# Patient Record
Sex: Female | Born: 1960 | Race: White | Hispanic: No | Marital: Married | Smoking: Never smoker
Health system: Southern US, Community
[De-identification: ages and names within clinical notes are randomized; demographics above are authoritative.]

## PROBLEM LIST (undated history)

## (undated) DIAGNOSIS — T7840XA Allergy, unspecified, initial encounter: Secondary | ICD-10-CM

## (undated) DIAGNOSIS — B019 Varicella without complication: Secondary | ICD-10-CM

## (undated) DIAGNOSIS — G43909 Migraine, unspecified, not intractable, without status migrainosus: Secondary | ICD-10-CM

## (undated) DIAGNOSIS — M5030 Other cervical disc degeneration, unspecified cervical region: Secondary | ICD-10-CM

## (undated) DIAGNOSIS — N8000 Endometriosis of the uterus, unspecified: Secondary | ICD-10-CM

## (undated) DIAGNOSIS — K219 Gastro-esophageal reflux disease without esophagitis: Secondary | ICD-10-CM

## (undated) DIAGNOSIS — K625 Hemorrhage of anus and rectum: Secondary | ICD-10-CM

## (undated) DIAGNOSIS — K589 Irritable bowel syndrome without diarrhea: Secondary | ICD-10-CM

## (undated) DIAGNOSIS — K648 Other hemorrhoids: Secondary | ICD-10-CM

## (undated) DIAGNOSIS — K635 Polyp of colon: Secondary | ICD-10-CM

## (undated) DIAGNOSIS — E538 Deficiency of other specified B group vitamins: Secondary | ICD-10-CM

## (undated) DIAGNOSIS — E785 Hyperlipidemia, unspecified: Secondary | ICD-10-CM

## (undated) DIAGNOSIS — M549 Dorsalgia, unspecified: Secondary | ICD-10-CM

## (undated) HISTORY — DX: Deficiency of other specified B group vitamins: E53.8

## (undated) HISTORY — DX: Irritable bowel syndrome, unspecified: K58.9

## (undated) HISTORY — PX: COSMETIC SURGERY: SHX468

## (undated) HISTORY — PX: JOINT REPLACEMENT: SHX530

## (undated) HISTORY — PX: TOTAL HIP ARTHROPLASTY: SHX124

## (undated) HISTORY — DX: Other hemorrhoids: K64.8

## (undated) HISTORY — PX: LAPAROSCOPY: SHX197

## (undated) HISTORY — PX: ABDOMINAL HYSTERECTOMY: SHX81

## (undated) HISTORY — DX: Endometriosis of the uterus, unspecified: N80.00

## (undated) HISTORY — DX: Allergy, unspecified, initial encounter: T78.40XA

## (undated) HISTORY — DX: Hemorrhage of anus and rectum: K62.5

## (undated) HISTORY — DX: Gastro-esophageal reflux disease without esophagitis: K21.9

## (undated) HISTORY — DX: Other cervical disc degeneration, unspecified cervical region: M50.30

## (undated) HISTORY — DX: Endometriosis of uterus: N80.0

## (undated) HISTORY — DX: Hyperlipidemia, unspecified: E78.5

## (undated) HISTORY — DX: Migraine, unspecified, not intractable, without status migrainosus: G43.909

## (undated) HISTORY — DX: Dorsalgia, unspecified: M54.9

## (undated) HISTORY — PX: TUBAL LIGATION: SHX77

## (undated) HISTORY — DX: Varicella without complication: B01.9

## (undated) HISTORY — DX: Polyp of colon: K63.5

---

## 1992-10-27 HISTORY — PX: PARTIAL HYSTERECTOMY: SHX80

## 1996-10-27 HISTORY — PX: AUGMENTATION MAMMAPLASTY: SUR837

## 1998-03-05 ENCOUNTER — Other Ambulatory Visit: Admission: RE | Admit: 1998-03-05 | Discharge: 1998-03-05 | Payer: Self-pay | Admitting: Obstetrics and Gynecology

## 1998-12-19 ENCOUNTER — Emergency Department (HOSPITAL_COMMUNITY): Admission: EM | Admit: 1998-12-19 | Discharge: 1998-12-19 | Payer: Self-pay | Admitting: Emergency Medicine

## 1999-01-15 ENCOUNTER — Encounter: Payer: Self-pay | Admitting: Neurosurgery

## 1999-01-15 ENCOUNTER — Ambulatory Visit (HOSPITAL_COMMUNITY): Admission: RE | Admit: 1999-01-15 | Discharge: 1999-01-15 | Payer: Self-pay | Admitting: Neurosurgery

## 1999-03-15 ENCOUNTER — Other Ambulatory Visit: Admission: RE | Admit: 1999-03-15 | Discharge: 1999-03-15 | Payer: Self-pay | Admitting: Obstetrics and Gynecology

## 2000-02-25 HISTORY — PX: OOPHORECTOMY: SHX86

## 2000-02-25 HISTORY — PX: APPENDECTOMY: SHX54

## 2000-03-25 ENCOUNTER — Encounter (INDEPENDENT_AMBULATORY_CARE_PROVIDER_SITE_OTHER): Payer: Self-pay | Admitting: Specialist

## 2000-03-25 ENCOUNTER — Inpatient Hospital Stay (HOSPITAL_COMMUNITY): Admission: EM | Admit: 2000-03-25 | Discharge: 2000-03-26 | Payer: Self-pay | Admitting: Obstetrics and Gynecology

## 2000-12-18 ENCOUNTER — Encounter: Payer: Self-pay | Admitting: Gastroenterology

## 2001-01-14 ENCOUNTER — Other Ambulatory Visit: Admission: RE | Admit: 2001-01-14 | Discharge: 2001-01-14 | Payer: Self-pay | Admitting: Gastroenterology

## 2001-01-14 ENCOUNTER — Encounter: Payer: Self-pay | Admitting: Gastroenterology

## 2001-01-14 ENCOUNTER — Encounter (INDEPENDENT_AMBULATORY_CARE_PROVIDER_SITE_OTHER): Payer: Self-pay | Admitting: Specialist

## 2001-02-05 ENCOUNTER — Emergency Department (HOSPITAL_COMMUNITY): Admission: EM | Admit: 2001-02-05 | Discharge: 2001-02-05 | Payer: Self-pay | Admitting: Emergency Medicine

## 2001-06-22 ENCOUNTER — Other Ambulatory Visit: Admission: RE | Admit: 2001-06-22 | Discharge: 2001-06-22 | Payer: Self-pay | Admitting: Obstetrics and Gynecology

## 2002-06-30 ENCOUNTER — Other Ambulatory Visit: Admission: RE | Admit: 2002-06-30 | Discharge: 2002-06-30 | Payer: Self-pay | Admitting: Obstetrics and Gynecology

## 2002-07-26 ENCOUNTER — Encounter: Payer: Self-pay | Admitting: Obstetrics and Gynecology

## 2002-07-26 ENCOUNTER — Encounter: Admission: RE | Admit: 2002-07-26 | Discharge: 2002-07-26 | Payer: Self-pay | Admitting: Obstetrics and Gynecology

## 2002-09-20 ENCOUNTER — Encounter: Payer: Self-pay | Admitting: Gastroenterology

## 2002-09-20 LAB — HM COLONOSCOPY: HM Colonoscopy: NORMAL

## 2003-05-08 ENCOUNTER — Encounter: Admission: RE | Admit: 2003-05-08 | Discharge: 2003-05-08 | Payer: Self-pay | Admitting: Obstetrics and Gynecology

## 2003-05-08 ENCOUNTER — Encounter: Payer: Self-pay | Admitting: Obstetrics and Gynecology

## 2003-07-28 ENCOUNTER — Other Ambulatory Visit: Admission: RE | Admit: 2003-07-28 | Discharge: 2003-07-28 | Payer: Self-pay | Admitting: Obstetrics and Gynecology

## 2004-03-21 ENCOUNTER — Inpatient Hospital Stay (HOSPITAL_COMMUNITY): Admission: RE | Admit: 2004-03-21 | Discharge: 2004-03-22 | Payer: Self-pay | Admitting: Surgery

## 2004-05-31 ENCOUNTER — Encounter (INDEPENDENT_AMBULATORY_CARE_PROVIDER_SITE_OTHER): Payer: Self-pay | Admitting: *Deleted

## 2004-05-31 ENCOUNTER — Encounter: Admission: RE | Admit: 2004-05-31 | Discharge: 2004-05-31 | Payer: Self-pay | Admitting: Surgery

## 2004-08-06 ENCOUNTER — Emergency Department (HOSPITAL_COMMUNITY): Admission: EM | Admit: 2004-08-06 | Discharge: 2004-08-06 | Payer: Self-pay | Admitting: Emergency Medicine

## 2004-09-13 ENCOUNTER — Encounter: Admission: RE | Admit: 2004-09-13 | Discharge: 2004-09-13 | Payer: Self-pay | Admitting: Obstetrics and Gynecology

## 2004-09-16 ENCOUNTER — Other Ambulatory Visit: Admission: RE | Admit: 2004-09-16 | Discharge: 2004-09-16 | Payer: Self-pay | Admitting: Obstetrics and Gynecology

## 2004-10-08 ENCOUNTER — Emergency Department (HOSPITAL_COMMUNITY): Admission: EM | Admit: 2004-10-08 | Discharge: 2004-10-08 | Payer: Self-pay | Admitting: Family Medicine

## 2004-12-17 ENCOUNTER — Ambulatory Visit: Payer: Self-pay | Admitting: Family Medicine

## 2005-03-14 ENCOUNTER — Ambulatory Visit: Payer: Self-pay | Admitting: Family Medicine

## 2005-04-03 ENCOUNTER — Ambulatory Visit: Payer: Self-pay | Admitting: Gastroenterology

## 2005-05-07 ENCOUNTER — Ambulatory Visit: Payer: Self-pay | Admitting: Gastroenterology

## 2005-07-11 ENCOUNTER — Ambulatory Visit: Payer: Self-pay | Admitting: Internal Medicine

## 2005-07-31 ENCOUNTER — Ambulatory Visit: Payer: Self-pay | Admitting: Internal Medicine

## 2005-08-01 ENCOUNTER — Encounter: Payer: Self-pay | Admitting: Gastroenterology

## 2005-08-07 ENCOUNTER — Ambulatory Visit: Payer: Self-pay | Admitting: Internal Medicine

## 2005-08-20 ENCOUNTER — Ambulatory Visit: Payer: Self-pay | Admitting: Internal Medicine

## 2005-08-28 ENCOUNTER — Encounter (INDEPENDENT_AMBULATORY_CARE_PROVIDER_SITE_OTHER): Payer: Self-pay | Admitting: *Deleted

## 2005-08-28 ENCOUNTER — Ambulatory Visit: Payer: Self-pay | Admitting: Cardiology

## 2005-10-10 ENCOUNTER — Other Ambulatory Visit: Admission: RE | Admit: 2005-10-10 | Discharge: 2005-10-10 | Payer: Self-pay | Admitting: Obstetrics and Gynecology

## 2005-10-24 ENCOUNTER — Ambulatory Visit: Payer: Self-pay | Admitting: Internal Medicine

## 2005-10-30 ENCOUNTER — Encounter: Admission: RE | Admit: 2005-10-30 | Discharge: 2005-10-30 | Payer: Self-pay | Admitting: Internal Medicine

## 2005-12-24 ENCOUNTER — Ambulatory Visit: Payer: Self-pay | Admitting: Cardiology

## 2006-02-10 ENCOUNTER — Ambulatory Visit: Payer: Self-pay | Admitting: Cardiology

## 2006-05-06 ENCOUNTER — Encounter: Admission: RE | Admit: 2006-05-06 | Discharge: 2006-05-06 | Payer: Self-pay | Admitting: Obstetrics and Gynecology

## 2006-09-21 ENCOUNTER — Ambulatory Visit: Payer: Self-pay | Admitting: Internal Medicine

## 2006-12-25 ENCOUNTER — Encounter: Admission: RE | Admit: 2006-12-25 | Discharge: 2006-12-25 | Payer: Self-pay | Admitting: Surgery

## 2006-12-25 ENCOUNTER — Encounter (INDEPENDENT_AMBULATORY_CARE_PROVIDER_SITE_OTHER): Payer: Self-pay | Admitting: *Deleted

## 2007-01-18 ENCOUNTER — Encounter: Admission: RE | Admit: 2007-01-18 | Discharge: 2007-01-18 | Payer: Self-pay | Admitting: Surgery

## 2007-07-16 ENCOUNTER — Encounter: Admission: RE | Admit: 2007-07-16 | Discharge: 2007-07-16 | Payer: Self-pay | Admitting: Obstetrics and Gynecology

## 2007-10-04 ENCOUNTER — Emergency Department (HOSPITAL_COMMUNITY): Admission: EM | Admit: 2007-10-04 | Discharge: 2007-10-04 | Payer: Self-pay | Admitting: Emergency Medicine

## 2007-10-07 ENCOUNTER — Ambulatory Visit: Payer: Self-pay | Admitting: Family Medicine

## 2007-10-07 DIAGNOSIS — E538 Deficiency of other specified B group vitamins: Secondary | ICD-10-CM | POA: Insufficient documentation

## 2007-10-07 DIAGNOSIS — M545 Low back pain, unspecified: Secondary | ICD-10-CM | POA: Insufficient documentation

## 2007-10-07 DIAGNOSIS — N800 Endometriosis of uterus: Secondary | ICD-10-CM | POA: Insufficient documentation

## 2007-10-25 LAB — HM MAMMOGRAPHY: HM Mammogram: NORMAL

## 2007-10-25 LAB — CONVERTED CEMR LAB: Pap Smear: NORMAL

## 2008-01-06 ENCOUNTER — Ambulatory Visit: Payer: Self-pay | Admitting: Cardiology

## 2008-01-06 ENCOUNTER — Encounter: Admission: RE | Admit: 2008-01-06 | Discharge: 2008-01-06 | Payer: Self-pay | Admitting: Obstetrics and Gynecology

## 2008-01-06 LAB — CONVERTED CEMR LAB
AST: 21 units/L (ref 0–37)
Alkaline Phosphatase: 59 units/L (ref 39–117)
Bilirubin, Direct: 0.1 mg/dL (ref 0.0–0.3)
Cholesterol: 217 mg/dL (ref 0–200)
Direct LDL: 168.7 mg/dL
HDL: 37.4 mg/dL — ABNORMAL LOW (ref >39.0)
Triglycerides: 79 mg/dL (ref 0–149)

## 2008-01-20 ENCOUNTER — Ambulatory Visit: Payer: Self-pay | Admitting: Cardiology

## 2008-01-26 ENCOUNTER — Ambulatory Visit: Payer: Self-pay | Admitting: Family Medicine

## 2008-01-27 LAB — CONVERTED CEMR LAB
BUN: 12 mg/dL (ref 6–23)
CO2: 31 meq/L (ref 19–32)
Calcium: 9 mg/dL (ref 8.4–10.5)
Chloride: 108 meq/L (ref 96–112)
Creatinine, Ser: 0.7 mg/dL (ref 0.4–1.2)
GFR calc non Af Amer: 96 mL/min
Hemoglobin: 13.7 g/dL (ref 12.0–15.0)
Lymphocytes Relative: 39 % (ref 12.0–46.0)
MCHC: 33.2 g/dL (ref 30.0–36.0)
MCV: 95.6 fL (ref 78.0–100.0)
Neutro Abs: 1.9 10*3/uL (ref 1.4–7.7)
Platelets: 235 10*3/uL (ref 150–400)
RBC: 4.32 M/uL (ref 3.87–5.11)
Sodium: 142 meq/L (ref 135–145)
TSH: 1.55 microintl units/mL (ref 0.35–5.50)
Vitamin B-12: 159 pg/mL — ABNORMAL LOW (ref 211–911)

## 2008-02-03 ENCOUNTER — Encounter (INDEPENDENT_AMBULATORY_CARE_PROVIDER_SITE_OTHER): Payer: Self-pay | Admitting: *Deleted

## 2008-02-07 ENCOUNTER — Telehealth (INDEPENDENT_AMBULATORY_CARE_PROVIDER_SITE_OTHER): Payer: Self-pay | Admitting: *Deleted

## 2008-02-08 ENCOUNTER — Ambulatory Visit: Payer: Self-pay | Admitting: Family Medicine

## 2008-02-24 ENCOUNTER — Ambulatory Visit: Payer: Self-pay | Admitting: Family Medicine

## 2008-03-02 ENCOUNTER — Ambulatory Visit: Payer: Self-pay | Admitting: Family Medicine

## 2008-03-09 ENCOUNTER — Ambulatory Visit: Payer: Self-pay | Admitting: Family Medicine

## 2008-03-16 ENCOUNTER — Ambulatory Visit: Payer: Self-pay | Admitting: Family Medicine

## 2008-03-22 ENCOUNTER — Telehealth (INDEPENDENT_AMBULATORY_CARE_PROVIDER_SITE_OTHER): Payer: Self-pay | Admitting: *Deleted

## 2008-03-29 LAB — CONVERTED CEMR LAB
Basophils Absolute: 0 10*3/uL (ref 0.0–0.1)
HCT: 41 % (ref 36.0–46.0)
Lymphocytes Relative: 42.1 % (ref 12.0–46.0)
Monocytes Absolute: 0.5 10*3/uL (ref 0.1–1.0)
Neutro Abs: 1.6 10*3/uL (ref 1.4–7.7)
Neutrophils Relative %: 40.8 % — ABNORMAL LOW (ref 43.0–77.0)
RDW: 12.6 % (ref 11.5–14.6)
Vitamin B-12: 388 pg/mL (ref 211–911)
WBC: 4 10*3/uL — ABNORMAL LOW (ref 4.5–10.5)

## 2008-03-30 ENCOUNTER — Encounter (INDEPENDENT_AMBULATORY_CARE_PROVIDER_SITE_OTHER): Payer: Self-pay | Admitting: *Deleted

## 2008-04-04 ENCOUNTER — Telehealth (INDEPENDENT_AMBULATORY_CARE_PROVIDER_SITE_OTHER): Payer: Self-pay | Admitting: *Deleted

## 2008-06-12 ENCOUNTER — Ambulatory Visit: Payer: Self-pay | Admitting: Internal Medicine

## 2008-06-12 DIAGNOSIS — J309 Allergic rhinitis, unspecified: Secondary | ICD-10-CM | POA: Insufficient documentation

## 2008-06-13 ENCOUNTER — Telehealth: Payer: Self-pay | Admitting: Internal Medicine

## 2008-11-23 ENCOUNTER — Ambulatory Visit: Payer: Self-pay | Admitting: Family Medicine

## 2008-11-23 DIAGNOSIS — E559 Vitamin D deficiency, unspecified: Secondary | ICD-10-CM | POA: Insufficient documentation

## 2008-11-27 ENCOUNTER — Encounter (INDEPENDENT_AMBULATORY_CARE_PROVIDER_SITE_OTHER): Payer: Self-pay | Admitting: *Deleted

## 2008-11-27 LAB — CONVERTED CEMR LAB: Vit D, 1,25-Dihydroxy: 24 — ABNORMAL LOW (ref 30–89)

## 2009-02-14 DIAGNOSIS — E785 Hyperlipidemia, unspecified: Secondary | ICD-10-CM | POA: Insufficient documentation

## 2009-10-02 ENCOUNTER — Ambulatory Visit: Payer: Self-pay | Admitting: Family Medicine

## 2009-10-02 DIAGNOSIS — K219 Gastro-esophageal reflux disease without esophagitis: Secondary | ICD-10-CM | POA: Insufficient documentation

## 2009-10-03 ENCOUNTER — Encounter (INDEPENDENT_AMBULATORY_CARE_PROVIDER_SITE_OTHER): Payer: Self-pay | Admitting: *Deleted

## 2009-10-10 ENCOUNTER — Telehealth: Payer: Self-pay | Admitting: Family Medicine

## 2009-10-27 HISTORY — PX: ESOPHAGOGASTRODUODENOSCOPY: SHX1529

## 2009-10-31 ENCOUNTER — Ambulatory Visit: Payer: Self-pay | Admitting: Gastroenterology

## 2009-10-31 ENCOUNTER — Encounter (INDEPENDENT_AMBULATORY_CARE_PROVIDER_SITE_OTHER): Payer: Self-pay | Admitting: *Deleted

## 2009-10-31 DIAGNOSIS — R1013 Epigastric pain: Secondary | ICD-10-CM | POA: Insufficient documentation

## 2009-11-01 LAB — CONVERTED CEMR LAB
AST: 24 units/L (ref 0–37)
Alkaline Phosphatase: 52 units/L (ref 39–117)
Basophils Absolute: 0 10*3/uL (ref 0.0–0.1)
Basophils Relative: 0.8 % (ref 0.0–3.0)
CO2: 28 meq/L (ref 19–32)
Chloride: 104 meq/L (ref 96–112)
Eosinophils Absolute: 0.1 10*3/uL (ref 0.0–0.7)
HCT: 42.4 % (ref 36.0–46.0)
Lymphocytes Relative: 43.9 % (ref 12.0–46.0)
Lymphs Abs: 1.7 10*3/uL (ref 0.7–4.0)
Monocytes Absolute: 0.3 10*3/uL (ref 0.1–1.0)
Monocytes Relative: 8 % (ref 3.0–12.0)
Potassium: 4.4 meq/L (ref 3.5–5.1)
RDW: 12.7 % (ref 11.5–14.6)
Total Bilirubin: 1 mg/dL (ref 0.3–1.2)
Total Protein: 6.7 g/dL (ref 6.0–8.3)
WBC: 3.9 10*3/uL — ABNORMAL LOW (ref 4.5–10.5)

## 2009-11-05 ENCOUNTER — Ambulatory Visit (HOSPITAL_COMMUNITY): Admission: RE | Admit: 2009-11-05 | Discharge: 2009-11-05 | Payer: Self-pay | Admitting: Gastroenterology

## 2009-11-14 ENCOUNTER — Ambulatory Visit: Payer: Self-pay | Admitting: Gastroenterology

## 2009-11-15 ENCOUNTER — Telehealth: Payer: Self-pay | Admitting: Gastroenterology

## 2009-12-03 ENCOUNTER — Telehealth (INDEPENDENT_AMBULATORY_CARE_PROVIDER_SITE_OTHER): Payer: Self-pay | Admitting: *Deleted

## 2009-12-05 ENCOUNTER — Encounter: Payer: Self-pay | Admitting: Family Medicine

## 2009-12-18 ENCOUNTER — Ambulatory Visit: Payer: Self-pay | Admitting: Gastroenterology

## 2010-04-06 ENCOUNTER — Emergency Department (HOSPITAL_BASED_OUTPATIENT_CLINIC_OR_DEPARTMENT_OTHER): Admission: EM | Admit: 2010-04-06 | Discharge: 2010-04-07 | Payer: Self-pay | Admitting: Emergency Medicine

## 2010-05-08 ENCOUNTER — Ambulatory Visit: Payer: Self-pay | Admitting: Family Medicine

## 2010-05-09 ENCOUNTER — Telehealth: Payer: Self-pay | Admitting: Gastroenterology

## 2010-05-14 ENCOUNTER — Telehealth (INDEPENDENT_AMBULATORY_CARE_PROVIDER_SITE_OTHER): Payer: Self-pay | Admitting: *Deleted

## 2010-05-14 LAB — CONVERTED CEMR LAB
ALT: 10 units/L (ref 0–35)
AST: 20 units/L (ref 0–37)
Albumin: 3.9 g/dL (ref 3.5–5.2)
Alkaline Phosphatase: 51 units/L (ref 39–117)
Basophils Absolute: 0 10*3/uL (ref 0.0–0.1)
Basophils Relative: 0.8 % (ref 0.0–3.0)
Bilirubin, Direct: 0.1 mg/dL (ref 0.0–0.3)
Folate: 8.4 ng/mL
Hemoglobin: 13.6 g/dL (ref 12.0–15.0)
Neutro Abs: 1.9 10*3/uL (ref 1.4–7.7)
RBC: 4.16 M/uL (ref 3.87–5.11)
Total Bilirubin: 0.6 mg/dL (ref 0.3–1.2)
Vitamin B-12: 178 pg/mL — ABNORMAL LOW (ref 211–911)

## 2010-05-24 ENCOUNTER — Ambulatory Visit: Payer: Self-pay | Admitting: Family Medicine

## 2010-05-24 DIAGNOSIS — J018 Other acute sinusitis: Secondary | ICD-10-CM | POA: Insufficient documentation

## 2010-05-24 DIAGNOSIS — J029 Acute pharyngitis, unspecified: Secondary | ICD-10-CM | POA: Insufficient documentation

## 2010-11-04 ENCOUNTER — Ambulatory Visit
Admission: RE | Admit: 2010-11-04 | Discharge: 2010-11-04 | Payer: Self-pay | Source: Home / Self Care | Attending: Gastroenterology | Admitting: Gastroenterology

## 2010-11-04 ENCOUNTER — Ambulatory Visit: Admit: 2010-11-04 | Payer: Self-pay | Admitting: Gastroenterology

## 2010-11-04 DIAGNOSIS — K5900 Constipation, unspecified: Secondary | ICD-10-CM | POA: Insufficient documentation

## 2010-11-04 DIAGNOSIS — R1030 Lower abdominal pain, unspecified: Secondary | ICD-10-CM | POA: Insufficient documentation

## 2010-11-04 DIAGNOSIS — R109 Unspecified abdominal pain: Secondary | ICD-10-CM | POA: Insufficient documentation

## 2010-11-17 ENCOUNTER — Encounter: Payer: Self-pay | Admitting: Obstetrics and Gynecology

## 2010-11-17 ENCOUNTER — Encounter: Payer: Self-pay | Admitting: Family Medicine

## 2010-11-28 NOTE — Assessment & Plan Note (Signed)
Summary: MOUTH FEELING BURN AND LABS--PH   Vital Signs:  Patient profile:   50 year old female Height:      65 inches Weight:      137 pounds Temp:     98.4 degrees F oral Pulse rate:   80 / minute BP sitting:   90 / 52  (left arm)  Vitals Entered By: Jeremy Johann CMA (May 08, 2010 8:19 AM) CC: burn and tingling in mouth, labs, refills Comments REVIEWED MED LIST, PATIENT AGREED DOSE AND INSTRUCTION CORRECT    History of Present Illness: Pt here c/o burning and tingling in mouth for 2-3 weeks.  Worse with cinnamon gum and spicy chicken. Last night she had some salsa that bothered her.   Pt also c/o increased GERD symptoms---she stopped the dexilant.  She has tried zegerid and prevacid with no relief.   Pt also needs vita D and B12 checked.  Current Medications (verified): 1)  Estratest 1.25-2.5 Mg  Tabs (Est Estrogens-Methyltest) .... Take One Tablet Daily 2)  Vitamin D [address] Unit  Caps (Ergocalciferol) .... Take One Tablet Weekly. Needs Labwork. 3)  Cobal-1000 1000 Mcg/ml Inj Soln (Cyanocobalamin) .... Take One Injection Weekly For One Month and Then One Injection Monthly After 4 Weekly Injections. 4)  Dexilant 60 Mg Cpdr (Dexlansoprazole) .Marland Kitchen.. 1 By Mouth Once Daily 5)  Hyoscyamine Sulfate 0.125 Mg Subl (Hyoscyamine Sulfate) .... Take 2 Tablets By Mouth Under Tongue Four Times A Day As Needed  Allergies (verified): 1)  Prednisone 2)  Codeine 3)  Morphine  Past History:  Past medical, surgical, family and social histories (including risk factors) reviewed for relevance to current acute and chronic problems.  Past Medical History: Reviewed history from 10/31/2009 and no changes required. HYPERLIPIDEMIA-MIXED (ICD-272.4) UNSPECIFIED VITAMIN D DEFICIENCY (ICD-268.9) ALLERGIC RHINITIS (ICD-477.9) BACK PAIN, LUMBAR (ICD-724.2) ENDOMETRIOSIS OF UTERUS (ICD-617.0) B12 DEFICIENCY (ICD-266.2) INTERNAL HEMORRHOIDS  Past Surgical History: Reviewed history from 02/14/2009 and  no changes required. Appendectomy 2001 Hysterectomy (partial 1994) 2001 full hysterectomy Laparscopies for endometriosis  Family History: Reviewed history from 10/31/2009 and no changes required. Mother: CHF in her 62's and hyperlipidemia but no known CAD No FH of Colon Cancer: Family History of Colon Polyps:Father   Social History: Reviewed history from 06/12/2008 and no changes required. Occupation:paralegal Married Never Smoked Alcohol use-no Drug use-no Regular exercise-yes  Review of Systems      See HPI  Physical Exam  General:  Well-developed,well-nourished,in no acute distress; alert,appropriate and cooperative throughout examination Mouth:  Oral mucosa and oropharynx without lesions or exudates.  Teeth in good repair. Abdomen:  soft, no distention, no masses, no guarding, and epigastric tenderness.   Skin:  Intact without suspicious lesions or rashes Cervical Nodes:  No lymphadenopathy noted Psych:  Cognition and judgment appear intact. Alert and cooperative with normal attention span and concentration. No apparent delusions, illusions, hallucinations   Impression & Recommendations:  Problem # 1:  GERD (ICD-530.81)  Her updated medication list for this problem includes:    Dexilant 60 Mg Cpdr (Dexlansoprazole) .Marland Kitchen... 1 by mouth once daily    Hyoscyamine Sulfate 0.125 Mg Subl (Hyoscyamine sulfate) .Marland Kitchen... Take 2 tablets by mouth under tongue four times a day as needed  Orders: Venipuncture (60454) TLB-CBC Platelet - w/Differential (85025-CBCD) TLB-Hepatic/Liver Function Pnl (80076-HEPATIC) TLB-B12 + Folate Pnl (09811_91478-G95/AOZ) TLB-Amylase (82150-AMYL) TLB-Lipase (83690-LIPASE) T-Vitamin D (25-Hydroxy) (30865-78469) TLB-H. Pylori Abs(Helicobacter Pylori) (86677-HELICO)  Diagnostics Reviewed:  EGD: DONE (11/14/2009) Discussed lifestyle modifications, diet, antacids/medications, and preventive measures. Handout provided.   Problem #  2:  ABDOMINAL PAIN,  EPIGASTRIC (ICD-789.06)  Orders: Venipuncture (04540) TLB-CBC Platelet - w/Differential (85025-CBCD) TLB-Hepatic/Liver Function Pnl (80076-HEPATIC) TLB-B12 + Folate Pnl (98119_14782-N56/OZH) TLB-Amylase (82150-AMYL) TLB-Lipase (83690-LIPASE) T-Vitamin D (25-Hydroxy) (08657-84696)  Discussed symptom control with the patient.   Problem # 3:  UNSPECIFIED VITAMIN D DEFICIENCY (ICD-268.9)  Orders: Venipuncture (29528) TLB-CBC Platelet - w/Differential (85025-CBCD) TLB-Hepatic/Liver Function Pnl (80076-HEPATIC) TLB-B12 + Folate Pnl (41324_40102-V25/DGU) TLB-Amylase (82150-AMYL) TLB-Lipase (83690-LIPASE) T-Vitamin D (25-Hydroxy) (44034-74259)  Problem # 4:  B12 DEFICIENCY (ICD-266.2)  Orders: Venipuncture (56387) TLB-CBC Platelet - w/Differential (85025-CBCD) TLB-Hepatic/Liver Function Pnl (80076-HEPATIC) TLB-B12 + Folate Pnl (56433_29518-A41/YSA) TLB-Amylase (82150-AMYL) TLB-Lipase (83690-LIPASE) T-Vitamin D (25-Hydroxy) (63016-01093)  Problem # 5:  HYPERLIPIDEMIA-MIXED (ICD-272.4) pt had labs checked at gyn and cholesterol normal now per pt---she will get a copy to [place] Orders: Venipuncture (23557) TLB-CBC Platelet - w/Differential (85025-CBCD) TLB-Hepatic/Liver Function Pnl (80076-HEPATIC) TLB-B12 + Folate Pnl (32202_54270-W23/JSE) TLB-Amylase (82150-AMYL) TLB-Lipase (83690-LIPASE) T-Vitamin D (25-Hydroxy) (83151-76160)  Complete Medication List: 1)  Estratest 1.25-2.5 Mg Tabs (Est estrogens-methyltest) .... Take one tablet daily 2)  Vitamin D 73710 Unit Caps (Ergocalciferol) .... Take one tablet weekly. needs labwork. 3)  Cobal-1000 1000 Mcg/ml Inj Soln (Cyanocobalamin) .... Take one injection weekly for one month and then one injection monthly after 4 weekly injections. 4)  Dexilant 60 Mg Cpdr (Dexlansoprazole) .Marland Kitchen.. 1 by mouth once daily 5)  Hyoscyamine Sulfate 0.125 Mg Subl (Hyoscyamine sulfate) .... Take 2 tablets by mouth under tongue four times a day as  needed  Patient Instructions: 1)  Avoid foods high in acid(tomatoes, citrus juices,spicy foods).Avoid eating within two hours of lying down or before exercising. Do not over eat: try smaller more frequent meals. Elevate head of bed twelve inches when sleeping.  Prescriptions: DEXILANT 60 MG CPDR (DEXLANSOPRAZOLE) 1 by mouth once daily  #30 x 11   Entered and Authorized by:   Loreen Freud DO   Signed by:   Loreen Freud DO on 05/08/2010   Method used:   [facility]  [address] [phone][facility] (retail[facility]       [address]       [place], [place]  [address]       Ph: [phone] or [phone]       Fax: [phone]   RxID:   [id number]

## 2010-11-28 NOTE — Procedures (Signed)
Summary: Flexible Sigmoidoscopy   Flexible Sigmoidoscopy  Procedure date:  05/07/2005  Findings:      Results: Hemorrhoids. Quality of Study: Good.  Patient Name: Patricia Alvarez, Aronov MRN:  Procedure Procedures: Flexible Proctosigmoidoscopy CPT: [phone].    with injection sclerosis of hemorrhoids CPT: 46500 Personnel: Endoscopist: Venita Lick. Russella Dar, MD, [facility] Graham.  Exam Location: Exam performed in Outpatient Clinic. Outpatient  Patient Consent: Procedure, Alternatives, Risks and Benefits discussed, consent obtained, from patient.  Indications Symptoms: Hematochezia. Rectal pain.  History  Current Medications: Patient is not currently taking Coumadin.  Pre-Exam Physical: Performed May 07, 2005. Cardio-pulmonary exam  WNL. Rectal exam, HEENT exam , Abdominal exam, Mental status exam WNL.  Exam Exam: Extent visualized: Descending Colon. Extent of exam: 50 cm. Colon retroflexion performed. ASA Classification: II. Tolerance: excellent.  Monitoring: Pulse and BP monitoring, Oximetry used. Supplemental O2 given.  Colon Prep Used Fleets enema for colon prep. Prep: excellent.  Sedation Meds: Patient assessed and found to be appropriate for moderate (conscious) sedation. Fentanyl 50 mcg. given IV. Versed 5 mg. given IV.  Findings NORMAL EXAM: Descending Colon to Sigmoid Colon.  HEMORRHOIDS: Internal. Size: Small. Not bleeding. Not thrombosed. ICD9: Hemorrhoids, Internal: 455.0.  - Injection: Rectum. 1 ccs. Outcome: successful. Comments: 23.4% saline injected.   Assessment  Diagnoses: 455.0: Hemorrhoids, Internal.   Events  Unplanned Intervention: No intervention was required.  Unplanned Events: There were no complications. Plans Patient Education: Patient given standard instructions for: Hemorrhoids.  Disposition: After procedure patient sent to recovery. After recovery patient sent home.  Scheduling: Office Visit, to [employer]. Russella Dar, MD, [facility], prn    This report  was created from the original endoscopy report, which was reviewed and signed by the above listed endoscopist.    cc: Loreen Freud, DO

## 2010-11-28 NOTE — Procedures (Signed)
Summary: EGD and biopsy   EGD  Procedure date:  01/14/2001  Findings:      Findings: Esophagitis  Location: [facility]    EGD  Procedure date:  01/14/2001  Findings:      Findings: Esophagitis  Location:  [facility]   Patient Name: Patricia Alvarez, Patricia Alvarez MRN:  Procedure Procedures: Panendoscopy (EGD) CPT: 43235.    with biopsy(s)/brushing(s). CPT: [id number].  Personnel: Endoscopist: Venita Lick. Russella Dar, MD, Clement[facility] Graham.  Referred By: Linda Hedges Plotnikov, MD.  Exam Location: Exam performed in Outpatient Clinic. Outpatient  Patient Consent: Procedure, Alternatives, Risks and Benefits discussed, consent obtained, from patient.  Indications  Abnormal Exams, Studies: B12 deficiency,  Symptoms: Abdominal pain, location: epigastric. Reflux symptoms  History  Pre-Exam Physical: Performed Jan 14, 2001  Cardio-pulmonary exam, HEENT exam, Abdominal exam, Extremity exam, Neurological exam, Mental status exam WNL.  Exam Exam Info: Maximum depth of insertion Duodenum, intended Duodenum. Patient position: on left side. Vocal cords not visualized. Gastric retroflexion performed. Images taken. ASA Classification: I. Tolerance: excellent.  Sedation Meds: Fentanyl 50 mcg. Versed 4 mg. Cetacaine Spray 2 sprays  Monitoring: BP and pulse monitoring done. Oximetry used. Supplemental O2 given  Findings ESOPHAGEAL INFLAMMATION: as a result of reflux. Severity is mild, erythema only.  [place] Classification: Grade 0. ICD9: Esophagitis, Reflux: 530.11.  - Normal: Cardia to Duodenal 2nd Portion.    Comments: gastric body biopsy taken Assessment Abnormal examination, see findings above.  Diagnoses: 530.11: Esophagitis, Reflux.   Events  Unplanned Intervention: No unplanned interventions were required.  Unplanned Events: There were no complications. Plans Medication(s): Await pathology. PPI: Pantoprazole/Protonix 40 mg QD,   Patient Education: Patient given  standard instructions for: Reflux.  Disposition: After procedure patient sent to recovery. After recovery patient sent home.  Scheduling: Blood Tests, to [employer]. Russella Dar, MD, [facility] Graham, Jan 14, 2001.  Clinic Visit, to [facility]. Russella Dar, MD, [facility], around Feb 25, 2001.  Referring provider, to Linda Hedges. Plotnikov, MD,    This report was created from the original endoscopy report, which was reviewed and signed by the above listed endoscopist.    cc: Linda Hedges. Plotnikov, MD        SP Surgical Pathology - STATUS: Final             By: Morrie Sheldon,       Perform Date: 21Mar02 15:12  Ordered By: Rica Records Date:  Facility: [facility]                              Department: CPATH  Service Report Text  [facility]   [address] [place], [place] [address]   [phone]    REPORT OF SURGICAL PATHOLOGY    Case #: [id number]   Patient Name: Patricia Alvarez, Patricia Alvarez   PID: [id number]   Pathologist: Beulah Gandy. Luisa Hart, MD   DOB/Age 02-01-1961 (Age: 50) Gender: F   Date Taken: 01/14/2001   Date Received: 01/14/2001    FINAL DIAGNOSIS    ***MICROSCOPIC EXAMINATION AND DIAGNOSIS***    STOMACH, BODY, BIOPSY: UNREMARKABLE GASTRIC FUNDIC MUCOSA. NO   ATROPHIC CHANGES, HELICONBACTER PYLORI OR INFLAMMATION   IDENTIFIED.    COMMENT   A Diff Quik stain is performed to determine the possibility of   the presence of Helicobacter pylori. No Helicobacter pylori is   identified  with the Diff Quik stain. The control stained   appropriately.    ab   Date Reported: 01/15/2001 Beulah Gandy. Luisa Hart, MD   *** Electronically Signed Out By JDP ***    Clinical information   B12 deficiency, R/O gastric atrophy. (tmc)    specimen(s) obtained   Stomach body, biopsy    Gross Description   Received in formalin is a tan, soft tissue fragment that is   submitted en toto. Size: 0.1 cm (SSW:smr 3/21)    smr/

## 2010-11-28 NOTE — Miscellaneous (Signed)
Summary: Orders Update/ glycopyrrolate rx  Clinical Lists Changes  Medications: Added new medication of GLYCOPYRROLATE 2 MG  TABS (GLYCOPYRROLATE) 2 mg p.o. b.i.d. as needed - Signed Rx of GLYCOPYRROLATE 2 MG  TABS (GLYCOPYRROLATE) 2 mg p.o. b.i.d. as needed;  #60 x 11;  Signed;  Entered by: Weston Brass;  Authorized by: Meryl Dare MD [facility];  [facility] [address] [phone]*, [address] [place], [place], [place]  [address], Ph: [phone] or [phone], Fax: [phone] Allergies: Added new allergy or adverse reaction of * CODEINE,MORPHINE    Prescriptions: GLYCOPYRROLATE 2 MG  TABS (GLYCOPYRROLATE) 2 mg p.o. b.i.d. as needed  #60 x 11   Entered by:   Weston Brass   Authorized by:   Meryl Dare MD [facility]   Signed by:   Weston Brass on 11/14/2009   Method used:   [facility]        [facility]  [address] [phone]* (retail)       [address]       [place], [place]  [address]       Ph: [phone] or [phone]       Fax: [phone]   RxID:   [id number]

## 2010-11-28 NOTE — Progress Notes (Signed)
Summary: lab results  Phone Note Outgoing Call   Call placed by: [facility] CMA,  May 14, 2010 1:02 PM Details for Reason: B12 low---- b12 weekly x4 then monthly---recheck 1 month Summary of Call: left message to call office................Marland KitchenFelecia Deloach CMA  May 14, 2010 1:02 PM   Follow-up for Phone Call        Message left on triage VM: Patient would like recent lab results, please call [phone] Follow-up by: Shonna Chock CMA,  May 14, 2010 2:04 PM  Additional Follow-up for Phone Call Additional follow up Details #1::        DISCUSS WITH PATIENT, pt would  like to do injection at home as she has in past rx sent to pharmacy.............Marland KitchenFelecia Deloach CMA  May 14, 2010 4:41 PM     New/Updated Medications: COBAL-1000 1000 MCG/ML INJ SOLN (CYANOCOBALAMIN) Take one injection(72ml) weekly for one month and then one injection monthly after 4 weekly injections. Prescriptions: COBAL-1000 1000 MCG/ML INJ SOLN (CYANOCOBALAMIN) Take one injection(11ml) weekly for one month and then one injection monthly after 4 weekly injections.  #36month x 1   Entered by:   Jeremy Johann CMA   Authorized by:   Loreen Freud DO   Signed by:   Jeremy Johann CMA on 05/14/2010   Method used:   [facility] ...       [facility]  [address] [phone]* (retail)       [address]       [place], [place]  [address]       Ph: [phone] or [phone]       Fax: [phone]   RxID:   [id number]

## 2010-11-28 NOTE — Assessment & Plan Note (Signed)
Summary: GERD...EM   History of Present Illness Visit Type: consult  Primary GI MD: Elie Goody MD [facility] Primary Provider: Loreen Freud, DO  Requesting Provider: Loreen Freud, DO  Chief Complaint: Epigastric pain, pain that radiates to her upper back on right side, and nausea  History of Present Illness:   This is a 50 year old female who relates epigastric pain associated with nausea and reflux symptoms. GERD symptoms had previously been treated with Axid in 2002 and responded. She was recently treated with Zegerid OTC for 2 weeks and responded.  She also has episodes of epigastric pain that radiates to her right upper quadrant and back. She is not certain that these symptoms have improved with acid suppressants. She is currently taking Dexilant as needed, which has not fully relieved her symptoms.   GI Review of Systems    Reports abdominal pain, acid reflux, and  nausea.     Location of  Abdominal pain: epigastric area.    Denies belching, bloating, chest pain, dysphagia with liquids, dysphagia with solids, heartburn, loss of appetite, vomiting, vomiting blood, weight loss, and  weight gain.        Denies anal fissure, black tarry stools, change in bowel habit, constipation, diarrhea, diverticulosis, fecal incontinence, heme positive stool, hemorrhoids, irritable bowel syndrome, jaundice, light color stool, liver problems, rectal bleeding, and  rectal pain.   Current Medications (verified): 1)  Estratest 1.25-2.5 Mg  Tabs (Est Estrogens-Methyltest) .... Take One Tablet Daily 2)  Vitamin D 13086 Unit  Caps (Ergocalciferol) .... Take One Tablet Weekly 3)  Cobal-1000 1000 Mcg/ml Inj Soln (Cyanocobalamin) .... Take One Injection Weekly For One Month and Then One Injection Monthly After 4 Weekly Injections. 4)  Dexilant 60 Mg Cpdr (Dexlansoprazole) .Marland Kitchen.. 1 By Mouth Once Daily  Allergies (verified): 1)  Prednisone  Past History:  Past Medical History: HYPERLIPIDEMIA-MIXED  (ICD-272.4) UNSPECIFIED VITAMIN D DEFICIENCY (ICD-268.9) ALLERGIC RHINITIS (ICD-477.9) BACK PAIN, LUMBAR (ICD-724.2) ENDOMETRIOSIS OF UTERUS (ICD-617.0) B12 DEFICIENCY (ICD-266.2) INTERNAL HEMORRHOIDS  Past Surgical History: Reviewed history from 02/14/2009 and no changes required. Appendectomy 2001 Hysterectomy (partial 1994) 2001 full hysterectomy Laparscopies for endometriosis  Family History: Mother: CHF in her 59's and hyperlipidemia but no known CAD No FH of Colon Cancer: Family History of Colon Polyps:Father   Social History: Reviewed history from 06/12/2008 and no changes required. 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         The pertinent positives and negatives are noted as above and in the HPI. All other ROS were reviewed and were negative.    Vital Signs:  Patient profile:   50 year old female Height:      65 inches Weight:      137 pounds BMI:     22.88 BSA:     1.69 Pulse rate:   62 / minute Pulse rhythm:   regular BP sitting:   100 / 60  (left arm) Cuff size:   regular  Vitals Entered By: Ok Anis CMA (October 31, 2009 11:16 AM)  Physical Exam  General:  Well developed, well nourished, no acute distress. Head:  Normocephalic and atraumatic. Eyes:  PERRLA, no icterus. Ears:  Normal auditory acuity. Mouth:  No deformity or lesions, dentition normal. Neck:  Supple; no masses or thyromegaly. Lungs:  Clear throughout to auscultation. Heart:  Regular rate and rhythm; no murmurs, rubs,  or bruits. Abdomen:  Soft and nondistended. No masses, hepatosplenomegaly or hernias noted. Normal bowel sounds. Mild epigastric tenderness without rebound or guarding. Msk:  Symmetrical with no gross deformities. Normal posture. Pulses:  Normal pulses noted. Extremities:  No clubbing, cyanosis,  edema or deformities noted. Neurologic:  Alert and  oriented x4;  grossly normal neurologically. Cervical Nodes:  No significant cervical adenopathy. Inguinal Nodes:  No significant inguinal adenopathy. Psych:  Alert and cooperative. Normal mood and affect.   Impression & Recommendations:  Problem # 1:  ABDOMINAL PAIN, EPIGASTRIC (ICD-789.06) Suspected GERD, gastritis or ulcer disease. Rule out cholelithiasis. The risks, benefits and alternatives to endoscopy with possible biopsy and possible dilation were discussed with the patient and they consent to proceed. The procedure will be scheduled electively. Orders: TLB-CMP (Comprehensive Metabolic Pnl) (80053-COMP) TLB-CBC Platelet - w/Differential (85025-CBCD) TLB-TSH (Thyroid Stimulating Hormone) (84443-TSH) Ultrasound Abdomen (UAS) EGD (EGD)  Problem # 2:  GERD (ICD-530.81) Standard antireflux measures. Dexilant 60mg  daily. EGD as above. Orders: TLB-CMP (Comprehensive Metabolic Pnl) (80053-COMP) TLB-CBC Platelet - w/Differential (85025-CBCD) TLB-TSH (Thyroid Stimulating Hormone) (84443-TSH) Ultrasound Abdomen (UAS) EGD (EGD)  Problem # 3:  SCREENING COLORECTAL-CANCER (ICD-V76.51) Routine risk for colorectal cancer. Screening colonoscopy at age 58, November 2013.  Patient Instructions: 1)  Get your labs drawn today in the basement.  2)  Conscious Sedation brochure given.  3)  Upper Endoscopy brochure given.  4)  Copy sent to : Loreen Freud, DO 5)  The medication list was reviewed and reconciled.  All changed / newly prescribed medications were explained.  A complete medication list was provided to the patient / caregiver.

## 2010-11-28 NOTE — Assessment & Plan Note (Signed)
Summary: possible sinus infection//kn   Vital Signs:  Patient profile:   50 year old female Height:      65 inches Weight:      139 pounds O2 Sat:      99 % on Room air Temp:     98.3 degrees F oral Pulse rate:   80 / minute BP sitting:   90 / 62  (left arm)  Vitals Entered By: Jeremy Johann CMA (May 24, 2010 9:58 AM)  O2 Flow:  Room air CC: SINUS INFECTION , DRY COUGH, DRAINAGE, SORE THROAT, PAIN IN TEETH, URI symptoms   History of Present Illness:       This is a 50 year old woman who presents with URI symptoms.  The symptoms began 4 days ago.  Pt c/o ST and congestion since Tuesday. She is taking Tylenol and gargling with salt water.  + chills.  The patient complains of nasal congestion, purulent nasal discharge, sore throat, dry cough, earache, and sick contacts.  The patient denies fever, low-grade fever (<100.5 degrees), fever of 100.5-103 degrees, fever of 103.1-104 degrees, fever to >104 degrees, stiff neck, dyspnea, wheezing, rash, vomiting, diarrhea, use of an antipyretic, and response to antipyretic.  The patient also reports headache and muscle aches.  The patient denies itchy watery eyes, itchy throat, sneezing, seasonal symptoms, response to antihistamine, and severe fatigue.  The patient denies the following risk factors for Strep sinusitis: unilateral facial pain, unilateral nasal discharge, poor response to decongestant, double sickening, tooth pain, Strep exposure, tender adenopathy, and absence of cough.    Current Medications (verified): 1)  Estratest 1.25-2.5 Mg  Tabs (Est Estrogens-Methyltest) .... Take One Tablet Daily 2)  Cobal-1000 1000 Mcg/ml Inj Soln (Cyanocobalamin) .... Take One Injection(38ml) Weekly For One Month and Then One Injection Monthly After 4 Weekly Injections. 3)  Dexilant 60 Mg Cpdr (Dexlansoprazole) .Marland Kitchen.. 1 By Mouth Once Daily 4)  Magic Mouthwash .... As Directed 5)  Fish Oil .... Once Daily 6)  Augmentin 875-125 Mg Tabs (Amoxicillin-Pot  Clavulanate) .Marland Kitchen.. 1 By Mouth Two Times A Day 7)  Nasonex 50 Mcg/act Susp (Mometasone Furoate) .... 2 Sprays Each Nostril Once Daily 8)  Zyrtec Allergy 10 Mg Tabs (Cetirizine Hcl) .Marland Kitchen.. 1 By Mouth Once Daily As Needed  Allergies (verified): 1)  Prednisone 2)  Codeine 3)  Morphine  Past History:  Past medical, surgical, family and social histories (including risk factors) reviewed for relevance to current acute and chronic problems.  Past Medical History: Reviewed history from 10/31/2009 and no changes required. HYPERLIPIDEMIA-MIXED (ICD-272.4) UNSPECIFIED VITAMIN D DEFICIENCY (ICD-268.9) ALLERGIC RHINITIS (ICD-477.9) BACK PAIN, LUMBAR (ICD-724.2) ENDOMETRIOSIS OF UTERUS (ICD-617.0) B12 DEFICIENCY (ICD-266.2) INTERNAL HEMORRHOIDS  Past Surgical History: Reviewed history from 02/14/2009 and no changes required. Appendectomy 2001 Hysterectomy (partial 1994) 2001 full hysterectomy Laparscopies for endometriosis  Family History: Reviewed history from 10/31/2009 and no changes required. Mother: CHF in her 56's and hyperlipidemia but no known CAD No FH of Colon Cancer: Family History of Colon Polyps:Father   Social History: Reviewed history from 06/12/2008 and no changes required. Occupation:paralegal Married Never Smoked Alcohol use-no Drug use-no Regular exercise-yes  Review of Systems      See HPI  Physical Exam  General:  Well-developed,well-nourished,in no acute distress; alert,appropriate and cooperative throughout examination Head:  Normocephalic and atraumatic without obvious abnormalities. No apparent alopecia or balding. Ears:  TM dull Nose:  L frontal sinus tenderness, L maxillary sinus tenderness, R frontal sinus tenderness, and R maxillary sinus tenderness.  Mouth:  tenderness in teeth good dentition and pharyngeal erythema.   Neck:  No deformities, masses, or tenderness noted. Lungs:  Normal respiratory effort, chest expands symmetrically. Lungs are clear  to auscultation, no crackles or wheezes. Heart:  normal rate and no murmur.     Impression & Recommendations:  Problem # 1:  OTHER ACUTE SINUSITIS (ICD-461.8)  Her updated medication list for this problem includes:    Augmentin 875-125 Mg Tabs (Amoxicillin-pot clavulanate) .Marland Kitchen... 1 by mouth two times a day    Nasonex 50 Mcg/act Susp (Mometasone furoate) .Marland Kitchen... 2 sprays each nostril once daily  Orders: Rapid Strep (14782)  Instructed on treatment. Call if symptoms persist or worsen.   Problem # 2:  ACUTE PHARYNGITIS (ICD-462)  Her updated medication list for this problem includes:    Augmentin 875-125 Mg Tabs (Amoxicillin-pot clavulanate) .Marland Kitchen... 1 by mouth two times a day  Orders: Rapid Strep (95621)  Complete Medication List: 1)  Estratest 1.25-2.5 Mg Tabs (Est estrogens-methyltest) .... Take one tablet daily 2)  Cobal-1000 1000 Mcg/ml Inj Soln (Cyanocobalamin) .... Take one injection(24ml) weekly for one month and then one injection monthly after 4 weekly injections. 3)  Dexilant 60 Mg Cpdr (Dexlansoprazole) .Marland Kitchen.. 1 by mouth once daily 4)  Magic Mouthwash  .... As directed 5)  Fish Oil  .... Once daily 6)  Augmentin 875-125 Mg Tabs (Amoxicillin-pot clavulanate) .Marland Kitchen.. 1 by mouth two times a day 7)  Nasonex 50 Mcg/act Susp (Mometasone furoate) .... 2 sprays each nostril once daily 8)  Zyrtec Allergy 10 Mg Tabs (Cetirizine hcl) .Marland Kitchen.. 1 by mouth once daily as needed Prescriptions: AUGMENTIN 875-125 MG TABS (AMOXICILLIN-POT CLAVULANATE) 1 by mouth two times a day  #20 x 0   Entered and Authorized by:   Loreen Freud DO   Signed by:   Loreen Freud DO on 05/24/2010   Method used:   [facility]  [address] [phone]* (retail)       [address]       [place], [place]  [address]       Ph: [phone] or [phone]       Fax: [phone]   RxID:   [id number]   Laboratory Results    Other Tests  Rapid Strep: negative

## 2010-11-28 NOTE — Progress Notes (Signed)
Summary: Triage   Phone Note Call from Patient Call back at [phone]   Caller: Patient Call For: Dr. Russella Dar Reason for Call: Talk to Nurse Summary of Call: Burning in the roof of her mouth and tingling in her lips and wants to know if he can be caused by Acid reflux.   Initial call taken by: Karna Christmas,  May 09, 2010 8:08 AM  Follow-up for Phone Call        Left message for patient to call back Darcey Nora RN, [facility]  May 09, 2010 8:41 AM  See Dr Ernst Spell note from yesterday.  Patient  c/o burning and tingling in her mouth for 2 1/2 weeks.  She has some GERD symptoms and epigastric pain. She rarely has heartburn.  Burning is in the roof of her mouth and tongue, worse with spicy foods, cinnamon, and mint.  She is concerned she has an oral CA.  Dr Laury Axon recommended she start back on Dexilant.  She has asked Dr Russella Dar to advise if he thought the burning would be from reflux, and who the best type of MD to screen for oral CA.  I thought it would be her dentist, but please advise. Follow-up by: Darcey Nora RN, CGRN,  May 09, 2010 8:49 AM  Additional Follow-up for Phone Call Additional follow up Details #1::        This is very likely something other than acid reflux but OK to resume PPI. Dentist or ENT would be the appropriate next step. Additional Follow-up by: Meryl Dare MD Clementeen Graham,  May 09, 2010 9:27 AM    Additional Follow-up for Phone Call Additional follow up Details #2::    patient notified of Dr Ardell Isaacs recommendations Follow-up by: Darcey Nora RN, CGRN,  May 09, 2010 9:52 AM

## 2010-11-28 NOTE — Letter (Signed)
Summary: EGD Instructions  [facility]  [address] [place], [place] [address]   Phone: [phone]  Fax: [phone]       JENAVIVE LAMBOY    21-May-1961    MRN: [id number]       Procedure Day Dorna Bloom: Wednesday January 19th, 2011     Arrival Time:  1:00pm     Procedure Time: 2:00pm     Location of Procedure:                    _  x_ [facility] (4th Floor)    PREPARATION FOR ENDOSCOPY   On 11/14/09  THE DAY OF THE PROCEDURE:  1.   No solid foods, milk or milk products are allowed after midnight the night before your procedure.  2.   Do not drink anything colored red or purple.  Avoid juices with pulp.  No orange juice.  3.  You may drink clear liquids until 12:00 , which is 2 hours before your procedure.                                                                                                CLEAR LIQUIDS INCLUDE: Water Jello Ice Popsicles Tea (sugar ok, no milk/cream) Powdered fruit flavored drinks Coffee (sugar ok, no milk/cream) Gatorade Juice: apple, white grape, white cranberry  Lemonade Clear bullion, consomm, broth Carbonated beverages (any kind) Strained chicken noodle soup Hard Candy   MEDICATION INSTRUCTIONS  Unless otherwise instructed, you should take regular prescription medications with a small sip of water as early as possible the morning of your procedure.           OTHER INSTRUCTIONS  You will need a responsible adult at least 50 years of age to accompany you and drive you home.   This person must remain in the waiting room during your procedure.  Wear loose fitting clothing that is easily removed.  Leave jewelry and other valuables at home.  However, you may wish to bring a book to read or an iPod/MP3 player to listen to music as you wait for your procedure to start.  Remove all body piercing jewelry and leave at home.  Total time from sign-in until discharge is approximately 2-3 hours.  You should go  home directly after your procedure and rest.  You can resume normal activities the day after your procedure.  The day of your procedure you should not:   Drive   Make legal decisions   Operate machinery   Drink alcohol   Return to work  You will receive specific instructions about eating, activities and medications before you leave.    The above instructions have been reviewed and explained to me by   Marchelle Folks.    I fully understand and can verbalize these instructions _____________________________ Date _________

## 2010-11-28 NOTE — Progress Notes (Signed)
Summary: Discuss meds    Phone Note Call from Patient Call back at [phone]   Call For: Dr Russella Dar Reason for Call: Talk to Nurse Summary of Call: Wants to discuss the medicine ordered for her yesterday.  Believes is gonna make her symptoms worse if she takes it. Initial call taken by: Leanor Kail [facility],  November 15, 2009 9:51 AM  Follow-up for Phone Call        attempted to reach patient on the above number it has a recording that this is a disconnected number it s "no longer in service".  I have left a messge on the patient's home phone to call back to discuss Follow-up by: Darcey Nora RN, CGRN,  November 15, 2009 10:16 AM  Additional Follow-up for Phone Call Additional follow up Details #1::        Patient wanted Dr Russella Dar to be aware she doesn't want to start on glycopyrolate due to the risk of side effects of constipation.   Additional Follow-up by: Darcey Nora RN, CGRN,  November 15, 2009 10:51 AM    Additional Follow-up for Phone Call Additional follow up Details #2::    Please advise her that most people do not experience the side effect of constipation. To decrease the change for side effects she can try 1/2 tablet once daily to start and increase to 1/2 tablet twice a day or 1 tablet once daily or two times a day if her pain is not relieved and she has no side effects. Follow-up by: Meryl Dare MD Clementeen Graham,  November 15, 2009 11:01 AM  Additional Follow-up for Phone Call Additional follow up Details #3:: Details for Additional Follow-up Action Taken: I have left a messge for the patient with Dr Ardell Isaacs recommendations.  I have asked her to call me back if she has any further questions or concerns. Additional Follow-up by: Darcey Nora RN, CGRN,  November 15, 2009 11:08 AM

## 2010-11-28 NOTE — Progress Notes (Signed)
Summary: refill  Phone Note Refill Request Message from:  Fax from Pharmacy on [address] fax fax [phone]  Refills Requested: Medication #1:  VITAMIN D 72536 UNIT  CAPS Take one tablet weekly Initial call taken by: Barb Merino,  December 03, 2009 11:37 AM    Prescriptions: VITAMIN D 64403 UNIT  CAPS (ERGOCALCIFEROL) Take one tablet weekly  #4 Capsule x 2   Entered by:   Army Fossa CMA   Authorized by:   Loreen Freud DO   Signed by:   Army Fossa CMA on 12/03/2009   Method used:   [facility]        [facility]  [address] [phone]* (retail)       [address]       [place], [place]  [id number]       Ph: [phone] or [phone]       Fax: [phone]   RxID:   [id number]

## 2010-11-28 NOTE — Assessment & Plan Note (Signed)
Summary: abd pain/all   History of Present Illness Visit Type: Follow-up Visit Primary GI MD: Elie Goody MD [facility] Primary Provider: Loreen Freud, DO  Requesting Provider: Loreen Freud, DO  Chief Complaint: abdominal pain severe at times with persistant soreness x 3 weeks History of Present Illness:   Patricia Alvarez has had several weeks of intermittent lower abdominal pain, associated with worsening constipation and intermittent bloating. She has no upper abdominal complaints and states her reflux symptoms are under very good control.   GI Review of Systems    Reports abdominal pain and  bloating.     Location of  Abdominal pain: lower abdomen.    Denies acid reflux, belching, chest pain, dysphagia with liquids, dysphagia with solids, heartburn, loss of appetite, nausea, vomiting, vomiting blood, weight loss, and  weight gain.      Reports constipation and  hemorrhoids.     Denies anal fissure, black tarry stools, change in bowel habit, diarrhea, diverticulosis, fecal incontinence, heme positive stool, irritable bowel syndrome, jaundice, light color stool, liver problems, rectal bleeding, and  rectal pain.   Current Medications (verified): 1)  Estratest 1.25-2.5 Mg  Tabs (Est Estrogens-Methyltest) .... Take One Tablet Daily 2)  Cobal-1000 1000 Mcg/ml Inj Soln (Cyanocobalamin) .... Take One Injection(38ml) Weekly For One Month and Then One Injection Monthly After 4 Weekly Injections. 3)  Dexilant 60 Mg Cpdr (Dexlansoprazole) .Marland Kitchen.. 1 By Mouth Once Daily 4)  Fish Oil .... Once Daily 5)  Zyrtec Allergy 10 Mg Tabs (Cetirizine Hcl) .Marland Kitchen.. 1 By Mouth Once Daily As Needed  Allergies (verified): 1)  Prednisone 2)  Codeine 3)  Morphine  Past History:  Past Medical History: Reviewed history from 10/31/2009 and no changes required. HYPERLIPIDEMIA-MIXED (ICD-272.4) UNSPECIFIED VITAMIN D DEFICIENCY (ICD-268.9) ALLERGIC RHINITIS (ICD-477.9) BACK PAIN, LUMBAR (ICD-724.2) ENDOMETRIOSIS OF UTERUS  (ICD-617.0) B12 DEFICIENCY (ICD-266.2) INTERNAL HEMORRHOIDS  Past Surgical History: Reviewed history from 02/14/2009 and no changes required. Appendectomy 2001 Hysterectomy (partial 1994) 2001 full hysterectomy Laparscopies for endometriosis  Family History: Reviewed history from 10/31/2009 and no changes required. Mother: CHF in her 39's and hyperlipidemia but no known CAD No FH of Colon Cancer: Family History of Colon Polyps:Father   Social History: Reviewed history from 06/12/2008 and no changes required. 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         The pertinent positives and negatives are noted as above and in the HPI. All other ROS were reviewed and were negative.   Vital Signs:  Patient profile:   50 year old female Height:      65 inches Weight:      140.38 pounds BMI:     23.44 Pulse rate:   60 / minute Pulse rhythm:   regular BP sitting:   82 / 60  (left arm) Cuff size:   regular  Vitals Entered By: June McMurray CMA Duncan Dull) (November 04, 2010 3:41 PM)  Physical Exam  General:  Well developed, well nourished, no acute distress. Head:  Normocephalic and atraumatic. Eyes:  PERRLA, no icterus. Ears:  Normal auditory acuity. Mouth:  No deformity or lesions, dentition normal. Lungs:  Clear throughout to auscultation. Heart:  Regular rate and rhythm; no murmurs, rubs,  or bruits. Abdomen:  Soft and nondistended. No masses, hepatosplenomegaly or hernias noted. Normal bowel sounds. Mild lower abdominal tenderness to deep palpation without rebound or guarding. Neurologic:  Alert and  oriented x4;  grossly normal neurologically. Psych:  Alert and cooperative. Normal mood and affect.  Impression & Recommendations:  Problem # 1:  ABDOMINAL PAIN-MULTIPLE SITES (ICD-789.09) Lower abdominal pain, associated with bloating and constipation. All  symptoms are likely related to constipation. Increase dietary fiber and water intake. Begin MiraLax once or twice daily on a regular basis. Hyoscyamine four times daily as needed to manage pain and bloating. If her symptoms do not completely respond to her regimen. She is advised to call back for consideration of further evaluation to possibly include blood work, stool Hemoccults and colonoscopy.  Problem # 2:  GERD (ICD-530.81) Symptoms under excellent control. Continue current medication and antireflux measures.  Patient Instructions: 1)  High Fiber, Low Fat  Healthy Eating Plan brochure given.  2)  Pick up your prescription from your pharmacy.  3)  Start Miralax mixing 17 grams in 8 oz of water 1-2 x daily.  4)  Please schedule a follow-up appointment as needed.  5)  Copy sent to : Loreen Freud, DO 6)  The medication list was reviewed and reconciled.  All changed / newly prescribed medications were explained.  A complete medication list was provided to the patient / caregiver.  Prescriptions: HYOSCYAMINE SULFATE 0.125 MG SUBL (HYOSCYAMINE SULFATE) 1-2 tablet by mouth four times a day as needed for abdominal pain, bloating  #60 x 11   Entered by:   Christie Nottingham CMA (AAMA)   Authorized by:   Meryl Dare MD [facility]   Signed by:   Christie Nottingham CMA (AAMA) on 11/04/2010   Method used:   [facility]        [facility]  [address] [phone]* (retail)       [address]       [place], [place]  [id number]       Ph: [phone] or [phone]       Fax: [phone]   RxID:   [id number]

## 2010-11-28 NOTE — Assessment & Plan Note (Signed)
Summary: Gastroenterology  [facility]   MR#:  [id number] Page #  NAME:  Patricia, Alvarez    OFFICE NO: [id number]  DATE:  04/03/05  DOB:  08/18/2061   The patient returns with complaints of intermittent hematochezia and rectal pain. She states that occasionally she has severe rectal pain that lasts for about 30 minutes after a bowel movement. Her last episode was about 4 weeks ago. She often then notes bright red blood per rectum on the tissue paper and in the commode for a day or 2 following that. She has not noted any anal swelling or obvious hemorrhoid.   She was hospitalized by Dr. Gerrit Friends on Mar 21, 2004, for a partial small-bowel obstruction, which resolved spontaneously. A small-bowel followthrough was performed in August 2005, which was normal. She underwent colonoscopy previously in November 2003, and the exam was entirely normal. It was performed for constipation, abdominal pain, bloating, and hematochezia.  She notes no weight loss or change in stool caliber. She was recently seen by Dr. Laury Axon, and a rectal examination revealed Hemoccult-positive stool with no other abnormalities.  CURRENT MEDICATIONS:  Estratest q.d., Allegra D q.d.  MEDICATION ALLERGIES: Sulfa drugs and Celexa.  PHYSICAL EXAMINATION:  No acute distress.  Weight 133.8 pounds. Blood pressure is 100/64. Pulse is 62 and regular. HEENT exam:  Anicteric sclerae; oropharynx clear.  Chest:  Clear to auscultation bilaterally.  Cardiac:  Regular rate and rhythm without murmurs.  Abdomen is soft, nontender, and nondistended with normoactive bowel sounds; no palpable organomegaly, masses or hernias. Rectal exam reveals no external or internal lesions. No obvious fissures or external hemorrhoids. No tenderness in the anal canal. Hemoccult-negative Burrill stool is noted.  ASSESSMENT AND PLAN:  Intermittent hematochezia associated with brief, severe episodes of rectal pain. Rule out hemorrhoids or recurrent fissures. Plan to proceed with  flexible sigmoidoscopy for further evaluation. She is advised to remain on a high-fiber diet with adequate fluid intake and begin the use of Anusol HC suppositories q.h.s. for the next 10 days. We will try Levsin sublingually 1-2 q. 4 hours p.r.n. rectal pain. Further plans pending findings at flexible sigmoidoscopy.     Venita Lick. Russella Dar, M.D., F.A.C.G. [id number] cc:  Lelon Perla, D.O.  D:  04/03/05; T:  ; Job [id number]

## 2010-11-28 NOTE — Assessment & Plan Note (Signed)
Summary: 4WK ENDO F-UP/YF   History of Present Illness Visit Type: Follow-up Visit Primary GI MD: Elie Goody MD [facility] Primary Provider: Loreen Freud, DO  Requesting Provider: Loreen Freud, DO  Chief Complaint: pt states she is still having nausea and she also has epigastric pain after eating.  Pt states it feels like a knife in her chest History of Present Illness:   This is a return visit for Ms. Patricia Alvarez for postprandial epigastric pain. Her symptoms have improved however, they persist. She has epigastric pain and nausea only intermittently now. Her symptoms tend to be precipitated by meals and last 30-60 minutes. She occasionally has pain radiating to the back.  She underwent a hepatobiliary scan with ejection fraction in February 2008 that showed an ejection fraction of 79%. A CT scan of the abdomen with and without contrast in 2006 was negative and a small bowel series in 2005. Recent EGD was negative.   GI Review of Systems    Reports nausea.     Location of  Abdominal pain: epigastric area.    Denies abdominal pain, acid reflux, belching, bloating, chest pain, dysphagia with liquids, dysphagia with solids, heartburn, loss of appetite, vomiting, vomiting blood, weight loss, and  weight gain.        Denies anal fissure, black tarry stools, change in bowel habit, constipation, diarrhea, diverticulosis, fecal incontinence, heme positive stool, hemorrhoids, irritable bowel syndrome, jaundice, light color stool, liver problems, rectal bleeding, and  rectal pain.   Current Medications (verified): 1)  Estratest 1.25-2.5 Mg  Tabs (Est Estrogens-Methyltest) .... Take One Tablet Daily 2)  Vitamin D 60454 Unit  Caps (Ergocalciferol) .... Take One Tablet Weekly 3)  Cobal-1000 1000 Mcg/ml Inj Soln (Cyanocobalamin) .... Take One Injection Weekly For One Month and Then One Injection Monthly After 4 Weekly Injections. 4)  Dexilant 60 Mg Cpdr (Dexlansoprazole) .Marland Kitchen.. 1 By Mouth Once  Daily  Allergies: 1)  Prednisone 2)  Codeine 3)  Morphine  Past History:  Past Medical History: Reviewed history from 10/31/2009 and no changes required. HYPERLIPIDEMIA-MIXED (ICD-272.4) UNSPECIFIED VITAMIN D DEFICIENCY (ICD-268.9) ALLERGIC RHINITIS (ICD-477.9) BACK PAIN, LUMBAR (ICD-724.2) ENDOMETRIOSIS OF UTERUS (ICD-617.0) B12 DEFICIENCY (ICD-266.2) INTERNAL HEMORRHOIDS  Past Surgical History: Reviewed history from 02/14/2009 and no changes required. Appendectomy 2001 Hysterectomy (partial 1994) 2001 full hysterectomy Laparscopies for endometriosis  Family History: Reviewed history from 10/31/2009 and no changes required. Mother: CHF in her 84's and hyperlipidemia but no known CAD No FH of Colon Cancer: Family History of Colon Polyps:Father   Social History: Reviewed history from 06/12/2008 and no changes required. Occupation:paralegal Married Never Smoked Alcohol use-no Drug use-no Regular exercise-yes  Review of Systems       The pertinent positives and negatives are noted as above and in the HPI. All other ROS were reviewed and were negative.   Vital Signs:  Patient profile:   50 year old female Height:      65 inches Weight:      141 pounds BMI:     23.55 Pulse rate:   72 / minute Pulse rhythm:   regular BP sitting:   90 / 56  (left arm) Cuff size:   regular  Vitals Entered By: Francee Piccolo CMA Duncan Dull) (December 18, 2009 2:33 PM)  Physical Exam  General:  Well developed, well nourished, no acute distress. Head:  Normocephalic and atraumatic. Eyes:  PERRLA, no icterus. Mouth:  No deformity or lesions, dentition normal. Lungs:  Clear throughout to auscultation. Heart:  Regular rate and  rhythm; no murmurs, rubs,  or bruits. Abdomen:  Soft, nontender and nondistended. No masses, hepatosplenomegaly or hernias noted. Normal bowel sounds. Psych:  Alert and cooperative. Normal mood and affect.  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 Rule out gastroparesis, rule out biliary dyskinesia. Since her symptoms are not typical for gastroparesis and she has had a prior normal gallbladder ejection fraction, we will try dietary modifications including a low-fat diet and if not successful then a low-fat, lactose-free diet. Trial of hyoscyamine in place glycopyrrolate, which lead to constipation. I offered her the option of surgical consultation for consideration of cholecystectomy and she will consider this in the future if her symptoms do not abate in no underlying cause is found.  Patient Instructions: 1)  Pick up your prescriptions from your pharmacy.  2)  Please schedule a follow-up appointment in 6 to 8 weeks.  3)  Copy sent to : Loreen Freud, DO 4)  The medication list was reviewed and reconciled.  All changed / newly prescribed medications were explained.  A complete medication list was provided to the patient / caregiver.  Prescriptions: HYOSCYAMINE SULFATE 0.125 MG SUBL (HYOSCYAMINE SULFATE) take 2 tablets by mouth under tongue four times a day as needed  #20 x 5   Entered by:   Christie Nottingham CMA (AAMA)   Authorized by:   Meryl Dare MD [facility]   Signed by:   Meryl Dare MD FACG on 12/18/2009   Method used:   Electronically to        [facility]  [address] [phone]* (retail)       [address]       [place], [place]  [id number]       Ph: [phone] or [phone]       Fax: [phone]   RxID:   [id number]

## 2010-11-28 NOTE — Procedures (Signed)
Summary: Upper Endoscopy  Patient: [name] Note: All result statuses are Final unless otherwise noted.  Tests: (1) Upper Endoscopy (EGD)   EGD Upper Endoscopy       DONE (C)     [facility]     [address]     [place], [place]  [address]           ENDOSCOPY PROCEDURE REPORT           PATIENT:  Patricia Alvarez, Patricia Alvarez  MR#:  [id number]     BIRTHDATE:  Nov 08, 1960, 48 yrs. old  GENDER:  female           ENDOSCOPIST:  Judie Petit T. Russella Dar, MD, [facility]           PROCEDURE DATE:  11/14/2009     PROCEDURE:  EGD, diagnostic     ASA CLASS:  Class I     INDICATIONS:  epigastric pain, GERD           MEDICATIONS:  Fentanyl 25 mcg IV, Versed 5 mg IV     TOPICAL ANESTHETIC:  Exactacain Spray           DESCRIPTION OF PROCEDURE:   After the risks benefits and     alternatives of the procedure were thoroughly explained, informed     consent was obtained.  The LB [id number]-H180 [id number] endoscope was     introduced through the mouth and advanced to the second portion of     the duodenum, without limitations.  The instrument was slowly     withdrawn as the mucosa was fully examined.     <<PROCEDUREIMAGES>>           The esophagus and gastroesophageal junction were completely normal     in appearance.  The stomach was entered and closely examined. The     pylorus, antrum, angularis, and lesser curvature were well     visualized, including a retroflexed view of the cardia and fundus.     The stomach wall was normally distensable. The scope passed easily     through the pylorus into the duodenum. The duodenal bulb was     normal in appearance, as was the postbulbar duodenum.  Retroflexed     views revealed no abnormalities.  The scope was then withdrawn     from the patient and the procedure completed.           COMPLICATIONS:  None           ENDOSCOPIC IMPRESSION:     1) Normal EGD           RECOMMENDATIONS:     1) anti-reflux regimen     2) continue PPI     3) glycopyrrolate 2mg  po bid as  needed, #60, 11 refills     4) Call office next 2-3 days to schedule an office appointment     for 4 weeks     5) suspected nonulcer dyspepsia           Judie Petit T. Russella Dar, MD, [facility]           CC:  Lelon Perla, DO           n.     REVISED:  11/14/2009 02:02 PM     eSIGNED:   Judie Petit T. Stark at 11/14/2009 02:02 PM           Talitha Givens, [id number]  Note: An exclamation mark (!) indicates a result that was not dispersed into the  flowsheet. Document Creation Date: 11/14/2009 2:03 PM _______________________________________________________________________  (1) Order result status: Final Collection or observation date-time: 11/14/2009 13:53 Requested date-time:  Receipt date-time:  Reported date-time:  Referring Physician:   Ordering Physician: Claudette Head [phone]) Specimen Source:  Source: Launa Grill Order Number: [id number] Lab site:

## 2010-11-28 NOTE — Procedures (Signed)
Summary: Colonoscopy   Colonoscopy  Procedure date:  09/20/2002  Findings:      Results: Normal. Location:  [facility].    Procedures Next Due Date:    Colonoscopy: 09/2012  Colonoscopy  Procedure date:  09/20/2002  Findings:      Results: Normal. Location:  [facility].    Procedures Next Due Date:    Colonoscopy: 09/2012 Patient Name: Patricia Alvarez, Patricia Alvarez MRN:  Procedure Procedures: Colonoscopy CPT: 04540.  Personnel: Endoscopist: Venita Lick. Russella Dar, MD, [facility].  Exam Location: Exam performed in Outpatient Clinic. Outpatient  Patient Consent: Procedure, Alternatives, Risks and Benefits discussed, consent obtained, from patient. Consent was obtained by the RN.  Indications Symptoms: Constipation Hematochezia. Abdominal pain / bloating.  History  Pre-Exam Physical: Performed Sep 20, 2002. Entire physical exam was normal.  Exam Exam: Extent of exam reached: Cecum, extent intended: Cecum.  The cecum was identified by appendiceal orifice and IC valve. Colon retroflexion performed. ASA Classification: II. Tolerance: good.  Monitoring: Pulse and BP monitoring, Oximetry used. Supplemental O2 given.  Colon Prep Used Golytely for colon prep. Prep results: good.  Sedation Meds: Patient assessed and found to be appropriate for moderate (conscious) sedation. Fentanyl 100 mcg. given IV. Versed 9 mg. given IV.  Findings NORMAL EXAM: Cecum to Rectum.   Assessment Normal examination.  Events  Unplanned Interventions: No intervention was required.  Unplanned Events: There were no complications. Plans Medication Plan: Continue current medications.  Patient Education: Patient given standard instructions for: Constipation. Disposition: After procedure patient sent to recovery. After recovery patient sent home.  Scheduling/Referral: Colonoscopy, to [facility] T. Russella Dar, MD, [facility], around Sep 20, 2012.  Primary Care Provider, to Angelena Sole, MD,      This report was created from the original endoscopy report, which was reviewed and signed by the above listed endoscopist.    cc: Angelena Sole, MD

## 2011-02-25 ENCOUNTER — Encounter: Payer: Self-pay | Admitting: Family Medicine

## 2011-02-26 ENCOUNTER — Ambulatory Visit (INDEPENDENT_AMBULATORY_CARE_PROVIDER_SITE_OTHER): Payer: 59 | Admitting: Family Medicine

## 2011-02-26 ENCOUNTER — Encounter: Payer: Self-pay | Admitting: Family Medicine

## 2011-02-26 VITALS — BP 92/60 | HR 64 | Wt 141.0 lb

## 2011-02-26 DIAGNOSIS — S139XXA Sprain of joints and ligaments of unspecified parts of neck, initial encounter: Secondary | ICD-10-CM

## 2011-02-26 DIAGNOSIS — M545 Low back pain, unspecified: Secondary | ICD-10-CM

## 2011-02-26 DIAGNOSIS — M549 Dorsalgia, unspecified: Secondary | ICD-10-CM | POA: Insufficient documentation

## 2011-02-26 DIAGNOSIS — M79604 Pain in right leg: Secondary | ICD-10-CM

## 2011-02-26 MED ORDER — CYCLOBENZAPRINE HCL 10 MG PO TABS: 10.0000 mg | ORAL_TABLET | Freq: Three times a day (TID) | ORAL | Status: AC | PRN

## 2011-02-26 NOTE — Assessment & Plan Note (Signed)
Schedule MRI Flexeril and nsaid for pain Chiropractor if MRI normal
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
## 2011-02-26 NOTE — Patient Instructions (Signed)
Back Pain (Lumbosacral Strain)   Back pain is one of the most common causes of pain. There are many causes of back pain. Most are not serious conditions.   CAUSES   Your backbone (spinal column) is made up of 24 main vertebral bodies, the sacrum, and the coccyx. These are held together by muscles and tough, fibrous tissue (ligaments). Nerve roots pass through the openings between the vertebrae. A sudden move or injury to the back may cause injury to, or pressure on, these nerves. This may result in localized back pain or pain movement (radiation) into the buttocks, down the leg, and into the foot. Sharp, shooting pain from the buttock down the back of the leg (sciatica) is frequently associated with a ruptured (herniated) disc. Pain may be caused by muscle spasm alone.   Your caregiver can often find the cause of your pain by the details of your symptoms and an exam. In some cases, you may need tests (such as X-rays). Your caregiver will work with you to decide if any tests are needed based on your specific exam.   HOME CARE INSTRUCTIONS   Avoid an underactive lifestyle. Active exercise, as directed by your caregiver, is your greatest weapon against back pain.   Avoid hard physical activities (tennis, racquetball, water-skiing) if you are not in proper physical condition for it. This may aggravate and/or create problems.   If you have a back problem, avoid sports requiring sudden body movements. Swimming and walking are generally safer activities.   Maintain good posture.   Avoid becoming overweight (obese).   Use bed rest for only the most extreme, sudden (acute) episode. Your caregiver will help you determine how much bed rest is necessary.   For acute conditions, you may put ice on the injured area.   Put ice in a plastic bag.   Place a towel between your skin and the bag.   Leave the ice on for 20 minutes at a time, every 2 hours, or as needed.   After you are improved and more active, it may help to apply heat  for 30 minutes before activities.   See your caregiver if you are having pain that lasts longer than expected. Your caregiver can advise appropriate exercises and/or therapy if needed. With conditioning, most back problems can be avoided.   SEEK IMMEDIATE MEDICAL CARE IF:   You have numbness, tingling, weakness, or problems with the use of your arms or legs.   You experience severe back pain not relieved with medicines.   There is a change in bowel or bladder control.   You have increasing pain in any area of the body, including your belly (abdomen).   You notice shortness of breath, dizziness, or feel faint.   You feel sick to your stomach (nauseous), are throwing up (vomiting), or become sweaty.   You notice discoloration of your toes or legs, or your feet get very cold.   Your back pain is getting worse.   You have an oral temperature above 100.4, not controlled by medicine.   MAKE SURE YOU:   Understand these instructions.   Will watch your condition.   Will get help right away if you are not doing well or get worse.   Document Released: 07/23/2005 Document Re-Released: 01/07/2010   ExitCare® Patient Information ©2011 [employer].

## 2011-02-26 NOTE — Assessment & Plan Note (Signed)
Stretching exercises Flexeril  Heat chiropractor

## 2011-02-28 ENCOUNTER — Ambulatory Visit: Payer: Self-pay | Admitting: Family Medicine

## 2011-03-11 NOTE — Assessment & Plan Note (Signed)
[facility]                            CARDIOLOGY OFFICE NOTE   Patricia Alvarez, Patricia Alvarez                        MRN:          [id number]  DATE:01/20/2008                            DOB:          08/14/1961    Ms. [name] returns today for further management of her hyperlipidemia and  family history of ischemic heart disease and stroke.   She took Crestor for about eight months and then started having diffuse  aches all in her joints.  We stopped the drug and this resolved in two  weeks.  We then placed her on pravastatin which also caused the same  problem.  I doubt very much she is going to tolerate any Statin except  perhaps a very, very low dose of Crestor, maybe 5 mg a day.   Her lipoprotein profile in the past showed a very high LDL particle  number of 2077.  Her LDL was 169.  Her HDL was low and continues to be  low in the mid 30s.  Her triglycerides are normal.  Total cholesterol  runs a little bit above 200, initially 234.  She did have a substantial  amount of large HDL particles.  A large majority of her LDL particles  for what it is worth, were large.   She is intolerant of CODEINE, MORPHINE and PERCOCET.  She has had no  previous dye reaction.  She is obviously intolerant of STATINS as well.   PAST MEDICAL HISTORY:  Also pertinent for history of not smoking,  drinking or any caffeinated beverages for that matter.  She is extremely  health conscious.  She tries to exercise at least 3 hours a week.  She  just joined a gym.   MEDICATION:  She is only on Estratest 12.5 mg daily.  This is really for  hot flashes.   SOCIAL HISTORY:  She is a Market researcher.  She is married, which is her second marriage.  She has two children by  her first marriage.   She has no orthopnea, PND, peripheral edema, dyspnea on exertion,  anginal symptoms, presyncope, syncope, palpitations.  She has had no  abdominal pain, nausea,  vomiting, difficulty swallowing, melena or  hematochezia.  Her other review of systems are negative.   PHYSICAL EXAMINATION:  GENERAL APPEARANCE:  She is extraordinarily  pleasant.  She looks extremely healthy and fit.  VITAL SIGNS:  Blood pressure 91/64, pulse 67 and regular.  Weight is  down a pound to 139.  HEENT:  Normocephalic, atraumatic.  PERRLA.  Extraocular movements  intact.  Sclerae are clear.  Facial symmetry is normal.  Carotid  upstrokes are equal bilaterally without bruits.  Dentition satisfactory.  NECK:  Supple.  Carotid upstrokes are equal bilaterally without bruits.  Thyroid is not enlarged.  Trachea is midline.  LUNGS:  Clear.  HEART:  She has a  nondisplaced PMI.  Normal S1 and S2 without murmur or  gallop.  ABDOMEN:  Soft with good bowel sounds.  No midline bruit.  No  hepatomegaly.  EXTREMITIES:  No sinus clubbing  or edema.  Pulses are brisk.  NEUROLOGIC:  Exam is intact.  SKIN:  Unremarkable.  There is no evidence of xanthelasma.   EKG is normal.   I had a long talk today with Ms. Manson Passey.  She is extraordinarily low risk  as I outlined in my previous evaluation, specifically her Framingham  risk is about 1% in the next 10 years of having a cardiovascular event.  Much of this is due to her being extremely health conscious and having  no other conventional risk factors other than family history.   At this point, I do not think a Statin is worth the potential problems  regarding side effects and reducing her quality of life.  All questions  were answered.  We will see her back in a year to see if there are any  other options.  For the time being, we will not prescribe any  hyperlipidemic therapy.     Thomas C. Daleen Squibb, MD, [facility]  Electronically Signed    TCW/MedQ  DD: 01/20/2008  DT: 01/20/2008  Job #: [id number]   cc:   Duke Salvia. Marcelle Overlie, M.D.

## 2011-03-14 NOTE — H&P (Signed)
[facility]  Patient:    Patricia Alvarez, Patricia Alvarez                    MRN: [id number] Adm. Date:  03/25/00 Attending:  Duke Salvia. Marcelle Overlie, M.D.                         History and Physical  CHIEF COMPLAINT:  Persistent left ovarian cyst, pelvic pain.  HISTORY OF PRESENT ILLNESS:  A 50 year old G 2, P 2, prior TVH and RSO, has her  left ovary remaining.  She has a six-month history of a left lower quadrant pain that has been evaluated by examination and ultrasound in the office.  The ultrasound showed a persistent 5.0 cm simple-appearing left ovarian cyst that has failed to resolve.  She presents now for a laparoscopy and possible LSO.  This procedure, including the risks relative to bleeding, infection, adjacent organ injury, and possible need for open surgery were discussed with her.  The plan is to perform a simple laparoscopic cystectomy, if this appears to be benign, since she wishes to conserve ovarian function.  She does have a history of endometriosis in the past.  Recently the [facility] was evaluated and also a thyroid profile due to some hot flashes, both of which were normal.  ALLERGIES:  No known drug allergies.  PAST SURGICAL HISTORY: 1. Laparoscopy in 1985. 2. Prior tubal ligation. 3. Prior LAVH, RSO. 4. Two vaginal deliveries at term.   PHYSICAL EXAMINATION:  VITAL SIGNS:  Temperature 98.2 degrees, blood pressure 120/62.  HEENT:  Unremarkable.  NECK:  Supple without masses.  LUNGS:  Clear.  CARDIOVASCULAR:  A regular rate and rhythm without murmurs, rubs, or gallops.  BREASTS:  Without masses.  ABDOMEN:  Soft, flat, nontender.  PELVIC:  Normal external genitalia.  Vaginal cuff is clear.  Bimanual examination revealed a 5.0 cm slightly tender left ovarian cyst.  EXTREMITIES:  Unremarkable.  NEUROLOGIC:  Unremarkable.  IMPRESSION:  Persistent left ovarian cyst.  PLAN:  Diagnostic laparoscopy, left ovarian cystectomy,  possible LSO.  The procedure and risks were discussed as above. DD:  03/21/00 TD:  03/21/00 Job: [id number]

## 2011-03-14 NOTE — Op Note (Signed)
[facility]  Patient:    Patricia Alvarez, Patricia Alvarez                        MRN: [id number] Proc. Date: 03/25/00 Adm. Date:  [id number] Attending:  Rhina Brackett                           Operative Report  PREOPERATIVE DIAGNOSES:  Chronic pelvic pain, left ovarian cyst.  POSTOPERATIVE DIAGNOSES:  Chronic pelvic pain, left ovarian cyst.  PROCEDURE:  Diagnostic laparoscopy, followed by a laparotomy, a left salpingo-oophorectomy and appendectomy.  SURGEON:  Duke Salvia. Marcelle Overlie, M.D.  ANESTHESIA:  General endotracheal.  COMPLICATIONS:  None.  DRAINS:  None.  ESTIMATED BLOOD LOSS:  75 cc.  PROCEDURE AND FINDINGS:  The patient went to the operating room and after an adequate level of general anesthesia was obtained, with the legs in stirrups, the abdomen, perineum, and vagina prepped and draped in the usual manner for laparoscopy.  The bladder was drained, the UA carried out.  The left ovary was prominent at 3-4 cm.  Attention directed to the abdomen, where a 2 cm subumbilical incision was made and the Veress needle was introduced without difficulty.  Intra-abdominal position was verified by pressure and water testing.  After a 2 L pneumoperitoneum was then created, laparoscopic trocar and tube were then introduced without difficulty.  This was a 12 mm trocar. There was no evidence of any bleeding or trauma.  Two finger breadths above the symphysis in the midline, a second puncture was made and a blunt probe was inserted.  With the patient in Trendelenburg, the pelvic findings were as follows:  The uterus, right tube, and ovary were surgically absent.  The left ovary had a 2 cm cyst at one pole with adherence of the left pelvic sidewall and also partially adherent to the appendix, which was pulled over to the left pelvic sidewall and adherent in the distal half of the appendix into the area of the left ovary and the left pelvic sidewall.  Once these findings  were noted, a decision made to proceed with open laparotomy.  The patients legs were placed supine.  A Foley catheter was positioned.  A Pfannenstiel incision made two finger breadths above the symphysis, carried down to the fascia, which was incised and extended transversely.  The rectus muscles were divided in the midline and the peritoneum entered superiorly without incident, and an OConnor-OSullivan retractor was positioned.  Bowel was packed superiorly out of the field, Babcock clamp was used to grasp the appendix, which again was adherent to the left ovary and left pelvic sidewall.  In an avascular plane, lysis of adhesions was carried out, freeing up the distal half of the appendix.  Once this was accomplished, the cecum was elevated.  Hemostats were used to secure the mesoappendix, clamping, cutting, and tying.  The base of the appendix was then crushed with a hemostat and moved distally.  A 2-0 chromic tie x 2 was placed around the crush site, and the appendix was excised.  The stump was ducked and tied with a 2-0 silk pursestring suture. This area was then irrigated and aspirated and noted to be hemostatic. Attention directed to the LSO.  The left round ligament was clamped, divided, and suture ligated with 0 Dexon.  The retroperitoneal space on that side was developed.  The left IP ligament was isolated.  The ureter was noted  to be well below.  The left IP ligament was clamped, divided, first free tied, followed by a suture ligature of Dexon.  The remainder of the ovary was excised by clamping the remaining pedicle and then excision followed by a free tie.  The pelvis was then re-irrigated with saline and aspirated and noted to be hemostatic.  No other evidence of any endometriosis.  The area of the appendectomy was noted to be hemostatic.  Prior to closure, sponge, needle, and instrument counts were correct x 2.  The rectus muscles were reapproximated with 3-0 Dexon interrupted  sutures, passed from laterally to midline on either side, with a 0 Dexon running suture.  The subcutaneous tissue was hemostatic.  Clips and Steri-Strips were used on the skin.  She tolerated this well and went to recovery in good condition.  She did receive Ancef 1 g IV intraoperatively. DD:  03/25/00 TD:  03/26/00 Job: [id number]

## 2011-03-14 NOTE — Discharge Summary (Signed)
[facility]  Patient:    Patricia Alvarez, Patricia Alvarez                        MRN: [id number] Adm. Date:  [id number] Disc. Date: [id number] Attending:  Rhina Brackett                           Discharge Summary  DISCHARGE DIAGNOSIS: 1. Chronic pelvic pain, left ovarian cyst. 2. Diagnostic laparoscopy, exploratory laparotomy with left    salpingo-oophorectomy and appendectomy this admission.  SUMMARY OF HISTORY AND PHYSICAL EXAM:  Please see admission H&P for details. Briefly, a 50 year old _____________   has had a six-month history of ____ quadrant pain with a 5 cm simple left ovarian cyst that has failed to resolve with ______.  Presents now for a laparoscopy.  HOSPITAL COURSE:  On May 30, the patient underwent a laparoscopy followed by a laparotomy, LSO, and appendectomy.  Findings at time of surgery demonstrated _________.  Her postoperative course was uneventful. [static] Tolerating a regular diet without difficulty and was ready for discharge.  LABORATORY DATA:  [static]   negative.   [severe static; unable to transcribe] DD:  05/05/00 TD:  05/05/00 Job: [id number]

## 2011-03-14 NOTE — H&P (Signed)
NAME:  Patricia Alvarez, Patricia Alvarez NO.:  [id number]   MEDICAL RECORD NO.:  [id number]                   PATIENT TYPE:  INP   LOCATION:  0453                                 FACILITY:  [facility]   PHYSICIAN:  Velora Heckler, M.D.                DATE OF BIRTH:  01/31/1961   DATE OF ADMISSION:  03/21/2004  DATE OF DISCHARGE:                                HISTORY & PHYSICAL   REFERRING PHYSICIAN:  Derrek Gu, M.D., at [facility].   REASON FOR ADMISSION:  1. Partial small bowel obstruction.  2. Abdominal pain.   HISTORY OF PRESENT ILLNESS:  The patient is a 50 year old white female who  presents at the request of Dr. Kathrynn Running for evaluation of abdominal pain,  abdominal distention, nausea, and abdominal x-rays consistent with bowel  obstruction.  The patient had awoke this morning with mild nausea.  She  developed lower right-sided abdominal pain.  This persisted and became more  intense.  She had developed abdominal distention and nausea.  She was seen  at [facility] by Dr. Kathrynn Running.  Laboratories included a normal hemoglobin and  a normal white blood cell count.  Abdominal x-rays, however, showed dilated  loops of bowel with air/fluid levels consistent with small bowel  obstruction.  General surgery was consulted.  The patient was admitted to  [facility] for management.   PAST MEDICAL HISTORY:  1. Status post laparoscopy for infertility x 4.  2. Status post vaginal hysterectomy in 1990.  3. Status post salpingo-oophorectomy and appendectomy in 2001.   MEDICATIONS:  Estratest and Allegra D.   ALLERGIES:  CODEINE.   SOCIAL HISTORY:  The patient is married.  She is accompanied by her husband.  She has two children.  She does not drink alcohol.  She does not smoke  tobacco.   FAMILY HISTORY:  Unremarkable.   REVIEW OF SYSTEMS:  The 15-system review was without significant other  positive, except as noted above.   PHYSICAL  EXAMINATION:  GENERAL APPEARANCE:  A 50 year old, well-developed,  well-nourished, white female on [facility] [address] at [facility].  VITAL SIGNS:  Temperature 98.1 degrees, pulse 75, respirations 16, blood  pressure 120/80.  WEIGHT:  131 pounds.  HEENT:  She is normocephalic.  The sclerae are clear.  The conjunctivae are  clear.  The pupils are equal and reactive.  Dentition is good.  Mucous  membranes are slightly dry.  Voice quality is normal.  NECK:  Supple without mass.  The thyroid was normal without nodularity.  There is no lymphadenopathy.  LUNGS:  Clear to auscultation bilaterally without rales, rhonchi, or  wheezes.  CARDIAC:  Regular rate and rhythm with occasional PVC.  There is no murmur.  Peripheral pulses are full.  ABDOMEN:  Soft and scaphoid with active bowel sounds on auscultation.  There  is no tenderness to  percussion.  There is mild tenderness to palpation in  the suprapubic and right lower quadrant.  There are no palpable masses.  There is no voluntary guarding.  There is no hepatosplenomegaly.  There is  no sign of hernia.  Surgical wounds appear well healed.  EXTREMITIES:  Nontender without edema.  NEUROLOGIC:  The patient is alert and oriented without focal deficit.   LABORATORY STUDIES:  From [facility], the urinalysis is benign.  A complete  blood count shows a white count of 9.1, a hemoglobin of 14.4, and a platelet  count of 300,000.  At [facility], a complete metabolic profile and  amylase level are both pending at the time of dictation.   RADIOGRAPHIC STUDIES:  Flat and upright abdominal x-rays from [facility]  show gaseous distention of multiple loops of small bowel and colon.  There  are air/fluid levels in the small bowel and the colon.  There is stool in  the rectum.  There is no free air.   IMPRESSION:  Partial small bowel obstruction.   PLAN:  1. Admission to [facility].  2. Initiation of intravenous  hydration.  3. NPO, except for sips of water and ice chips.  4. Repeat abdominal x-rays in a.m. on Mar 22, 2004.  5. Treatment for pain and nausea as needed.                                               Velora Heckler, M.D.    TMG/MEDQ  D:  03/21/2004  T:  03/22/2004  Job:  [id number]   cc:   Derrek Gu, M.D.  [facility]

## 2011-03-27 ENCOUNTER — Encounter: Payer: Self-pay | Admitting: Family Medicine

## 2011-05-16 ENCOUNTER — Other Ambulatory Visit: Payer: Self-pay | Admitting: Family Medicine

## 2011-05-16 MED ORDER — DEXLANSOPRAZOLE 60 MG PO CPDR: 60.0000 mg | DELAYED_RELEASE_CAPSULE | Freq: Every day | ORAL | Status: DC

## 2011-05-16 NOTE — Telephone Encounter (Signed)
Patients phone number is [phone]

## 2011-05-16 NOTE — Telephone Encounter (Signed)
Patient called and said that [facility] says we have denied this prescription for Dexilant--patient was seen in May for acute visit--Is it time for another type of appt?   Has this prescription actually been denied??  She needs a call to explain what is going on.

## 2011-05-16 NOTE — Telephone Encounter (Signed)
Rx faxed.    KP 

## 2011-07-02 ENCOUNTER — Telehealth: Payer: Self-pay | Admitting: Family Medicine

## 2011-07-02 NOTE — Telephone Encounter (Signed)
Prior Authorization in progress   KP

## 2011-07-02 NOTE — Telephone Encounter (Signed)
It is prior authorization and this take up to a few weeks depending on the insurance company.   KP

## 2011-07-08 NOTE — Telephone Encounter (Signed)
Prior auth approved 07-07-11 until 04-01-14, pharmacy notified via fax.

## 2011-07-17 ENCOUNTER — Telehealth: Payer: Self-pay | Admitting: Family Medicine

## 2011-07-17 MED ORDER — PANTOPRAZOLE SODIUM 40 MG PO TBEC: 40.0000 mg | DELAYED_RELEASE_TABLET | Freq: Every day | ORAL | Status: DC

## 2011-07-17 NOTE — Telephone Encounter (Signed)
Spoke with patient and she stated the dexilant was 192 dollars and it was too expensive. Per Dr.Lowne call in protonix 40 mg 1 po qd. Rx sent and patient is aware to call with any concerns     KP

## 2011-07-23 ENCOUNTER — Telehealth: Payer: Self-pay | Admitting: *Deleted

## 2011-07-23 NOTE — Telephone Encounter (Signed)
Prior Auth 07-07-11 until 04-01-14, approval letter scan to chart.

## 2011-10-01 ENCOUNTER — Ambulatory Visit: Payer: 59 | Admitting: Gastroenterology

## 2011-12-12 ENCOUNTER — Other Ambulatory Visit: Payer: Self-pay | Admitting: Family Medicine

## 2011-12-31 ENCOUNTER — Telehealth: Payer: Self-pay | Admitting: *Deleted

## 2011-12-31 NOTE — Telephone Encounter (Signed)
Pt c/o tingling in left arm causing numbness in pink and ring finger. Pt also c/o dizzy spell x2-3 month however the numbness has increase significant on Monday with more numbness and pain.  Pt denies any SOB, or chest pain, . Pt advised ED for symptoms Pt decline stating that she is currently out of town driving a car and would just like to schedule a appt with Dr Laury Axon. Pt also states that she does not feel this is a emergency. Advise Pt that I will have to speak with physician in reference to matter. Pt ok.Please advise

## 2011-12-31 NOTE — Telephone Encounter (Signed)
Pt made of the the following and advised ED if symptoms change or worsen. Pt ok stating that she would go to ED if it got any worse. Appt scheduled for tomorrow at 2 pm first available appt. Dr Beverely Low advise of this and ok.

## 2011-12-31 NOTE — Telephone Encounter (Signed)
Agree w/ ER but unfortunately if she is refusing ER there is nothing we can do but offer her an appt w/ Dr Laury Axon ASAP.  Please make her aware that if her sxs change or worsen she will need to go to the ER.

## 2012-01-01 ENCOUNTER — Ambulatory Visit: Payer: 59 | Admitting: Family Medicine

## 2012-05-05 ENCOUNTER — Encounter: Payer: Self-pay | Admitting: Internal Medicine

## 2012-05-05 ENCOUNTER — Other Ambulatory Visit: Payer: Self-pay | Admitting: Family Medicine

## 2012-05-05 ENCOUNTER — Ambulatory Visit (INDEPENDENT_AMBULATORY_CARE_PROVIDER_SITE_OTHER): Payer: BC Managed Care – PPO | Admitting: Internal Medicine

## 2012-05-05 VITALS — BP 98/66 | HR 71 | Temp 98.3°F | Wt 142.2 lb

## 2012-05-05 DIAGNOSIS — J209 Acute bronchitis, unspecified: Secondary | ICD-10-CM

## 2012-05-05 DIAGNOSIS — E559 Vitamin D deficiency, unspecified: Secondary | ICD-10-CM

## 2012-05-05 DIAGNOSIS — T887XXA Unspecified adverse effect of drug or medicament, initial encounter: Secondary | ICD-10-CM | POA: Insufficient documentation

## 2012-05-05 DIAGNOSIS — E538 Deficiency of other specified B group vitamins: Secondary | ICD-10-CM

## 2012-05-05 DIAGNOSIS — J069 Acute upper respiratory infection, unspecified: Secondary | ICD-10-CM

## 2012-05-05 LAB — VITAMIN B12: Vitamin B-12: 188 pg/mL — ABNORMAL LOW (ref 211–911)

## 2012-05-05 MED ORDER — AMOXICILLIN 500 MG PO CAPS: 500.0000 mg | ORAL_CAPSULE | Freq: Three times a day (TID) | ORAL | Status: AC

## 2012-05-05 MED ORDER — FLUTICASONE PROPIONATE 50 MCG/ACT NA SUSP: 1.0000 | Freq: Two times a day (BID) | NASAL | Status: DC | PRN

## 2012-05-05 NOTE — Patient Instructions (Addendum)
Plain Mucinex for thick secretions ;force NON dairy fluids . Use a Neti pot daily as needed for sinus congestion; going from open side to congested side . Nasal cleansing in the shower as discussed. Make sure that all residual soap is removed to prevent irritation. Fluticasone 1 spray in each nostril twice a day as needed. Use the "crossover" technique as discussed. Plain Allegra 160 daily as needed for itchy eyes & sneezing.   Please try to go on My Chart within the next 24 hours to allow me to release the results directly to you.

## 2012-05-05 NOTE — Progress Notes (Signed)
  Subjective:    Patient ID: Patricia Alvarez, female    DOB: Nov 02, 1960, 51 y.o.   MRN: [id number]  HPI 05/01/12 she developed significant postnasal drainage. She's had increase in sneezing as well as itchy, watery eyes. She does have perennial allergies. Claritin 24 was of no benefit.  05/04/12 she had  rhinitis on one side and nasal obstruction on the other in am. Subsequently she had rhinitis with clear drainage. She took Benadryl last night with some benefit. She is concerned that she may have communicable process; she is going to see her granddaughters.  She's had some cough with yellow sputum.    Review of Systems She has had facial pain without associated nasal purulence, frontal headache, sore throat, dental pain, earache, or otic discharge. She noticed chilling last night along with a cane. She's had no definite fever.  She's been off her B12 for at least a year     Objective:   Physical Exam General appearance:good health ;well nourished; no acute distress or increased work of breathing is present.  No  lymphadenopathy about the head, neck, or axilla noted.   Eyes: No conjunctival inflammation or lid edema is present. EOMI; vision normal  Ears:  External ear exam shows no significant lesions or deformities.  Otoscopic examination reveals clear canals, tympanic membranes are intact bilaterally without bulging, retraction, inflammation or discharge.  Nose:  External nasal examination shows no deformity or inflammation. Nasal mucosa are pink and moist without lesions or exudates. No septal dislocation or deviation.No obstruction to airflow.   Oral exam: Dental hygiene is good; lips and gums are healthy appearing.There is no oropharyngeal erythema or exudate noted.   Neck:  No deformities,  masses, or tenderness noted.    Heart:  Normal rate and regular rhythm. S1 and S2 normal without gallop, murmur, click, rub or other extra sounds.   Lungs:Chest clear to auscultation; no wheezes,  rhonchi,rales ,or rubs present.No increased work of breathing.    Extremities:  No cyanosis, edema, or clubbing  noted    Skin: Warm & dry          Assessment & Plan:  #1 facial pain and rhinitis without purulent secretions  #2 purulent sputum  #3 perennial rhinitis  #4 history of B12 deficiency; she has been off shots for extended period  Plan: See orders recommendations

## 2012-05-06 LAB — VITAMIN D 25 HYDROXY (VIT D DEFICIENCY, FRACTURES): Vit D, 25-Hydroxy: 44 ng/mL (ref 30–89)

## 2012-05-13 ENCOUNTER — Ambulatory Visit (INDEPENDENT_AMBULATORY_CARE_PROVIDER_SITE_OTHER): Payer: BC Managed Care – PPO | Admitting: Family Medicine

## 2012-05-13 ENCOUNTER — Encounter: Payer: Self-pay | Admitting: Family Medicine

## 2012-05-13 VITALS — BP 100/62 | HR 59 | Temp 98.1°F | Wt 141.4 lb

## 2012-05-13 DIAGNOSIS — E538 Deficiency of other specified B group vitamins: Secondary | ICD-10-CM

## 2012-05-13 DIAGNOSIS — M545 Low back pain, unspecified: Secondary | ICD-10-CM

## 2012-05-13 DIAGNOSIS — M79604 Pain in right leg: Secondary | ICD-10-CM

## 2012-05-13 MED ORDER — CYANOCOBALAMIN 500 MCG/0.1ML NA SOLN: NASAL | Status: DC

## 2012-05-13 MED ORDER — TRAMADOL HCL 50 MG PO TABS: 50.0000 mg | ORAL_TABLET | Freq: Four times a day (QID) | ORAL | Status: AC | PRN

## 2012-05-13 MED ORDER — CYCLOBENZAPRINE HCL 10 MG PO TABS: 10.0000 mg | ORAL_TABLET | Freq: Three times a day (TID) | ORAL | Status: AC | PRN

## 2012-05-13 NOTE — Progress Notes (Signed)
  Subjective:    Patricia Alvarez is a 51 y.o. female who presents for follow up of low back problems. Current symptoms include: numbness in R leg and pain in low back and R hip (burning, numbing and sharp in character; 10/10 in severity). Symptoms have significantly improved from the previous visit. Exacerbating factors identified by the patient are bending backwards, bending forwards, bending sideways, recumbency, sitting and walking.  Pt has been having problems off and on with her back for 12 years.  She was told she had a bulging disc then and she feels like its getting worse.  She has been going to chiropractor but it seems to aggravate now.     The following portions of the patient's history were reviewed and updated as appropriate: allergies, current medications, past family history, past medical history, past social history, past surgical history and problem list.    Objective:    BP 100/62  Pulse 59  Temp 98.1 F (36.7 C) (Oral)  Wt 141 lb 6.4 oz (64.139 kg)  SpO2 99% General appearance: alert, cooperative, appears stated age and no distress Extremities: extremities normal, atraumatic, no cyanosis or edema Neurologic: Sensory: normal Motor: decrease strength with ext RLE and dorsiflexion R foot Reflexes: decrease patellar on R Gait: Normal    Assessment:    history of bulging disc---low back pain    Plan:    Natural history and expected course discussed. Questions answered. Short (2-4 day) period of relative rest recommended until acute symptoms improve. Ice to affected area as needed for local pain relief. Heat to affected area as needed for local pain relief. MRI of the affected area due to presence of weakness in R leg and decrease reflexes. Muscle relaxants per medication orders. f/u 2 weeks prn

## 2012-05-13 NOTE — Patient Instructions (Addendum)
Back Pain, Adult Low back pain is very common. About 1 in 5 people have back pain.The cause of low back pain is rarely dangerous. The pain often gets better over time.About half of people with a sudden onset of back pain feel better in just 2 weeks. About 8 in 10 people feel better by 6 weeks.  CAUSES Some common causes of back pain include:  Strain of the muscles or ligaments supporting the spine.   Wear and tear (degeneration) of the spinal discs.   Arthritis.   Direct injury to the back.  DIAGNOSIS Most of the time, the direct cause of low back pain is not known.However, back pain can be treated effectively even when the exact cause of the pain is unknown.Answering your caregiver's questions about your overall health and symptoms is one of the most accurate ways to make sure the cause of your pain is not dangerous. If your caregiver needs more information, he or she may order lab work or imaging tests (X-rays or MRIs).However, even if imaging tests show changes in your back, this usually does not require surgery. HOME CARE INSTRUCTIONS For many people, back pain returns.Since low back pain is rarely dangerous, it is often a condition that people can learn to manageon their own.   Remain active. It is stressful on the back to sit or stand in one place. Do not sit, drive, or stand in one place for more than 30 minutes at a time. Take short walks on level surfaces as soon as pain allows.Try to increase the length of time you walk each day.   Do not stay in bed.Resting more than 1 or 2 days can delay your recovery.   Do not avoid exercise or work.Your body is made to move.It is not dangerous to be active, even though your back may hurt.Your back will likely heal faster if you return to being active before your pain is gone.   Pay attention to your body when you bend and lift. Many people have less discomfortwhen lifting if they bend their knees, keep the load close to their  bodies,and avoid twisting. Often, the most comfortable positions are those that put less stress on your recovering back.   Find a comfortable position to sleep. Use a firm mattress and lie on your side with your knees slightly bent. If you lie on your back, put a pillow under your knees.   Only take over-the-counter or prescription medicines as directed by your caregiver. Over-the-counter medicines to reduce pain and inflammation are often the most helpful.Your caregiver may prescribe muscle relaxant drugs.These medicines help dull your pain so you can more quickly return to your normal activities and healthy exercise.   Put ice on the injured area.   Put ice in a plastic bag.   Place a towel between your skin and the bag.   Leave the ice on for 15 to 20 minutes, 3 to 4 times a day for the first 2 to 3 days. After that, ice and heat may be alternated to reduce pain and spasms.   Ask your caregiver about trying back exercises and gentle massage. This may be of some benefit.   Avoid feeling anxious or stressed.Stress increases muscle tension and can worsen back pain.It is important to recognize when you are anxious or stressed and learn ways to manage it.Exercise is a great option.  SEEK MEDICAL CARE IF:  You have pain that is not relieved with rest or medicine.   You have   pain that does not improve in 1 week.   You have new symptoms.   You are generally not feeling well.  SEEK IMMEDIATE MEDICAL CARE IF:   You have pain that radiates from your back into your legs.   You develop new bowel or bladder control problems.   You have unusual weakness or numbness in your arms or legs.   You develop nausea or vomiting.   You develop abdominal pain.   You feel faint.  Document Released: 10/13/2005 Document Revised: 10/02/2011 Document Reviewed: 03/03/2011 Exit[employer] Patient Information 2012 [employer]. 

## 2012-05-21 ENCOUNTER — Other Ambulatory Visit (HOSPITAL_COMMUNITY): Payer: BC Managed Care – PPO

## 2012-06-11 ENCOUNTER — Inpatient Hospital Stay (HOSPITAL_COMMUNITY): Admission: RE | Admit: 2012-06-11 | Payer: BC Managed Care – PPO | Source: Ambulatory Visit

## 2012-08-10 ENCOUNTER — Encounter: Payer: Self-pay | Admitting: Gastroenterology

## 2012-09-02 ENCOUNTER — Encounter: Payer: Self-pay | Admitting: Family Medicine

## 2012-09-02 ENCOUNTER — Ambulatory Visit (INDEPENDENT_AMBULATORY_CARE_PROVIDER_SITE_OTHER): Payer: BC Managed Care – PPO | Admitting: Family Medicine

## 2012-09-02 VITALS — BP 104/66 | HR 73 | Temp 98.4°F | Wt 144.0 lb

## 2012-09-02 DIAGNOSIS — R109 Unspecified abdominal pain: Secondary | ICD-10-CM

## 2012-09-02 DIAGNOSIS — E538 Deficiency of other specified B group vitamins: Secondary | ICD-10-CM

## 2012-09-02 MED ORDER — CYANOCOBALAMIN 1000 MCG/ML IJ SOLN: 1000.0000 ug | INTRAMUSCULAR | Status: DC

## 2012-09-02 MED ORDER — CYANOCOBALAMIN 1000 MCG/ML IJ SOLN: INTRAMUSCULAR | Status: DC

## 2012-09-02 MED ORDER — CYANOCOBALAMIN 1000 MCG/ML IJ SOLN
1000.0000 ug | Freq: Once | INTRAMUSCULAR | Status: AC
  Administered 2012-09-02: 1000 ug via INTRAMUSCULAR

## 2012-09-02 MED ORDER — GI COCKTAIL ~~LOC~~
30.0000 mL | Freq: Once | ORAL | Status: AC
  Administered 2012-09-02: 30 mL via ORAL

## 2012-09-02 NOTE — Patient Instructions (Addendum)
Diet for Gastroesophageal Reflux Disease, Adult  Reflux (acid reflux) is when acid from your stomach flows up into the esophagus. When acid comes in contact with the esophagus, the acid causes irritation and soreness (inflammation) in the esophagus. When reflux happens often or so severely that it causes damage to the esophagus, it is called gastroesophageal reflux disease (GERD). Nutrition therapy can help ease the discomfort of GERD.  FOODS OR DRINKS TO AVOID OR LIMIT   Smoking or chewing tobacco. Nicotine is one of the most potent stimulants to acid production in the gastrointestinal tract.   Caffeinated and decaffeinated coffee and black tea.   Regular or low-calorie carbonated beverages or energy drinks (caffeine-free carbonated beverages are allowed).    Strong spices, such as black pepper, white pepper, red pepper, cayenne, curry powder, and chili powder.   Peppermint or spearmint.   Chocolate.   High-fat foods, including meats and fried foods. Extra added fats including oils, butter, salad dressings, and nuts. Limit these to less than 8 tsp per day.   Fruits and vegetables if they are not tolerated, such as citrus fruits or tomatoes.   Alcohol.   Any food that seems to aggravate your condition.  If you have questions regarding your diet, call your caregiver or a registered dietitian.  OTHER THINGS THAT MAY HELP GERD INCLUDE:    Eating your meals slowly, in a relaxed setting.   Eating 5 to 6 small meals per day instead of 3 large meals.   Eliminating food for a period of time if it causes distress.   Not lying down until 3 hours after eating a meal.   Keeping the head of your bed raised 6 to 9 inches (15 to 23 cm) by using a foam wedge or blocks under the legs of the bed. Lying flat may make symptoms worse.   Being physically active. Weight loss may be helpful in reducing reflux in overweight or obese adults.   Wear loose fitting clothing  EXAMPLE MEAL PLAN  This meal plan is approximately  2,000 calories based on ChooseMyPlate.gov meal planning guidelines.  Breakfast    cup cooked oatmeal.   1 cup strawberries.   1 cup low-fat milk.   1 oz almonds.  Snack   1 cup cucumber slices.   6 oz yogurt (made from low-fat or fat-free milk).  Lunch   2 slice whole-wheat bread.   2 oz sliced turkey.   2 tsp mayonnaise.   1 cup blueberries.   1 cup snap peas.  Snack   6 whole-wheat crackers.   1 oz string cheese.  Dinner    cup Bey rice.   1 cup mixed veggies.   1 tsp olive oil.   3 oz grilled fish.  Document Released: 10/13/2005 Document Revised: 01/05/2012 Document Reviewed: 08/29/2011  ExitCare Patient Information 2013 [employer].

## 2012-09-03 LAB — BASIC METABOLIC PANEL
CO2: 28 mEq/L (ref 19–32)
Calcium: 8.9 mg/dL (ref 8.4–10.5)
Creatinine, Ser: 0.5 mg/dL (ref 0.4–1.2)
GFR: 127.02 mL/min (ref >60.00)
Sodium: 139 mEq/L (ref 135–145)

## 2012-09-03 LAB — CBC WITH DIFFERENTIAL/PLATELET
Basophils Absolute: 0 10*3/uL (ref 0.0–0.1)
Basophils Relative: 0.8 % (ref 0.0–3.0)
Eosinophils Absolute: 0.1 10*3/uL (ref 0.0–0.7)
Lymphocytes Relative: 30 % (ref 12.0–46.0)
MCHC: 33 g/dL (ref 30.0–36.0)
Monocytes Relative: 6 % (ref 3.0–12.0)
Neutrophils Relative %: 60.7 % (ref 43.0–77.0)
RBC: 4.3 Mil/uL (ref 3.87–5.11)
RDW: 13.6 % (ref 11.5–14.6)

## 2012-09-03 LAB — HEPATIC FUNCTION PANEL
AST: 19 U/L (ref 0–37)
Alkaline Phosphatase: 49 U/L (ref 39–117)
Bilirubin, Direct: 0.1 mg/dL (ref 0.0–0.3)

## 2012-09-03 LAB — LIPASE: Lipase: 25 U/L (ref 11.0–59.0)

## 2012-09-03 NOTE — Progress Notes (Signed)
  Subjective:     Patricia Alvarez is an 51 y.o. female who presents for evaluation of heartburn. This has been associated with chest pain, cough, dysphagia, fullness after meals, heartburn and midespigastric pain. She denies abdominal bloating, belching and eructation, bilious reflux, choking on food, deep pressure at base of neck, difficulty swallowing, regurgitation of undigested food, shortness of breath and unexpected weight loss. Symptoms have been present for several weeks. She denies dysphagia. She has not lost weight. She denies melena, hematochezia, hematemesis, and coffee ground emesis. Medical therapy in the past has included: proton pump inhibitors.  The following portions of the patient's history were reviewed and updated as appropriate: allergies, current medications, past family history, past medical history, past social history, past surgical history and problem list.  Review of Systems Pertinent items are noted in HPI.   Objective:     BP 104/66  Pulse 73  Temp 98.4 F (36.9 C) (Oral)  Wt 144 lb (65.318 kg)  SpO2 97% General appearance: alert, cooperative, appears stated age and no distress Throat: lips, mucosa, and tongue normal; teeth and gums normal Neck: no adenopathy, supple, symmetrical, trachea midline and thyroid not enlarged, symmetric, no tenderness/mass/nodules Lungs: clear to auscultation bilaterally Heart: S1, S2 normal Abdomen: soft, non-tender; bowel sounds normal; no masses,  no organomegaly   Assessment:    Gastroesophageal Reflux Disease,      Plan:  dexilant daily Check labs Consider GI  take dexilant daily

## 2012-10-11 ENCOUNTER — Telehealth: Payer: Self-pay | Admitting: Family Medicine

## 2012-10-11 MED ORDER — DEXLANSOPRAZOLE 60 MG PO CPDR: 60.0000 mg | DELAYED_RELEASE_CAPSULE | Freq: Every day | ORAL | Status: DC

## 2012-10-11 NOTE — Telephone Encounter (Signed)
Refill: Dexilant dr 60 mg capsule. Take 1 capsule by mouth daily. Qty 30. Last fill 08-30-12

## 2012-11-03 ENCOUNTER — Ambulatory Visit (INDEPENDENT_AMBULATORY_CARE_PROVIDER_SITE_OTHER): Payer: BC Managed Care – PPO | Admitting: Family Medicine

## 2012-11-03 ENCOUNTER — Telehealth: Payer: Self-pay | Admitting: Family Medicine

## 2012-11-03 ENCOUNTER — Encounter: Payer: Self-pay | Admitting: Family Medicine

## 2012-11-03 VITALS — BP 104/72 | HR 96 | Temp 98.3°F | Wt 142.6 lb

## 2012-11-03 DIAGNOSIS — R509 Fever, unspecified: Secondary | ICD-10-CM

## 2012-11-03 DIAGNOSIS — J329 Chronic sinusitis, unspecified: Secondary | ICD-10-CM

## 2012-11-03 LAB — POCT INFLUENZA A/B: Influenza B, POC: NEGATIVE

## 2012-11-03 MED ORDER — FLUCONAZOLE 150 MG PO TABS: ORAL_TABLET | ORAL | Status: DC

## 2012-11-03 MED ORDER — CEFUROXIME AXETIL 500 MG PO TABS: 500.0000 mg | ORAL_TABLET | Freq: Two times a day (BID) | ORAL | Status: AC

## 2012-11-03 NOTE — Patient Instructions (Addendum)

## 2012-11-03 NOTE — Telephone Encounter (Signed)
Appointment Scheduled:  11/03/2012 11:15:00  Appointment Scheduled Provider:  Lelon Perla

## 2012-11-03 NOTE — Telephone Encounter (Signed)
Patient Information:  Caller Name: Patricia Alvarez  Phone: [phone]  Patient: Patricia Alvarez, Patricia Alvarez  Gender: Female  DOB: 07/25/1961  Age: 52 Years  PCP: Lelon Perla.  Pregnant: No  Office Follow Up:  Does the office need to follow up with this patient?: No  Instructions For The Office: N/A  RN Note:  pt also has pain over cheekbones  Symptoms  Reason For Call & Symptoms: pt reports she has cough, cold aching and chills.  Pt reports green, [place] mucous.  Pt states that she has been on 2 rounds of Amoxicillin since August and that gave her terrible yeast infections.  Pt is worried about getting another antibiotic/yeast infection again.  Pt does report a fever but she has not taken her temp.  Reviewed Health History In EMR: Yes  Reviewed Medications In EMR: Yes  Reviewed Allergies In EMR: Yes  Reviewed Surgeries / Procedures: Yes  Date of Onset of Symptoms: 10/30/2012  Treatments Tried: warm compresses, Tylenol  Treatments Tried Worked: No OB / GYN:  LMP: Unknown  Guideline(s) Used:  Sinus Pain and Congestion  Disposition Per Guideline:   See Today in Office  Reason For Disposition Reached:   Sinus pain (not just congestion) and fever  Advice Given:  N/A  Appointment Scheduled:  11/03/2012 11:15:00 Appointment Scheduled Provider:  Lelon Perla.

## 2012-11-03 NOTE — Progress Notes (Signed)
  Subjective:     Patricia Alvarez is a 52 y.o. female who presents for evaluation of symptoms of a URI. Symptoms include achiness, congestion, cough described as productive, fever did not take temp, felt hot, headache described as pressure, nasal congestion, sinus pressure, sore throat, tooth pain and chills. Onset of symptoms was 7 days ago, and has been gradually worsening since that time. Treatment to date: antihistamines and cough suppressants.  The following portions of the patient's history were reviewed and updated as appropriate: allergies, current medications, past family history, past medical history, past social history, past surgical history and problem list.  Review of Systems Pertinent items are noted in HPI.   Objective:    BP 104/72  Pulse 96  Temp 98.3 F (36.8 C) (Oral)  Wt 142 lb 9.6 oz (64.683 kg)  SpO2 98% General appearance: alert, cooperative, appears stated age and mild distress Ears: normal TM's and external ear canals both ears Nose: green discharge, moderate congestion, turbinates red, swollen, sinus tenderness bilateral Throat: lips, mucosa, and tongue normal; teeth and gums normal Neck: mild anterior cervical adenopathy, no adenopathy, supple, symmetrical, trachea midline and thyroid not enlarged, symmetric, no tenderness/mass/nodules Lungs: rhonchi bilaterally Heart: S1, S2 normal   Assessment:    sinusitis   Plan:    Discussed the diagnosis and treatment of sinusitis. Suggested symptomatic OTC remedies. Nasal saline spray for congestion. Ceftin per orders. Nasal steroids per orders. Follow up as needed. diflucan for yeast infection prn

## 2013-01-07 ENCOUNTER — Encounter: Payer: Self-pay | Admitting: Gastroenterology

## 2013-01-17 ENCOUNTER — Telehealth: Payer: Self-pay | Admitting: Family Medicine

## 2013-01-17 MED ORDER — DEXLANSOPRAZOLE 60 MG PO CPDR: 60.0000 mg | DELAYED_RELEASE_CAPSULE | Freq: Every day | ORAL | Status: DC

## 2013-01-17 NOTE — Telephone Encounter (Signed)
Rx sent 

## 2013-01-17 NOTE — Telephone Encounter (Signed)
Refill-dexilant dr 60mg  capsule. Take one capsule by mouth daily. Qty 30 last fill 2.20.14

## 2013-02-21 ENCOUNTER — Encounter: Payer: Self-pay | Admitting: Gastroenterology

## 2013-02-22 ENCOUNTER — Telehealth: Payer: Self-pay | Admitting: Family Medicine

## 2013-02-22 NOTE — Telephone Encounter (Signed)
They are going to want a recent ov--  She will need ov because its almost been a year

## 2013-02-22 NOTE — Telephone Encounter (Signed)
Pt called to see if dr Laury Axon could re-schedule her a referral for a MRI bc she was unable to make it in 06/11/2012. thanks

## 2013-02-22 NOTE — Telephone Encounter (Signed)
Please advise      KP 

## 2013-02-23 NOTE — Telephone Encounter (Signed)
Apt scheduled for Monday.    KP 

## 2013-02-24 ENCOUNTER — Encounter: Payer: BC Managed Care – PPO | Admitting: Gastroenterology

## 2013-02-28 ENCOUNTER — Ambulatory Visit: Payer: BC Managed Care – PPO | Admitting: Family Medicine

## 2013-03-03 ENCOUNTER — Ambulatory Visit: Payer: BC Managed Care – PPO | Admitting: Family Medicine

## 2013-04-27 ENCOUNTER — Other Ambulatory Visit: Payer: Self-pay | Admitting: Family Medicine

## 2013-06-28 ENCOUNTER — Telehealth: Payer: Self-pay | Admitting: *Deleted

## 2013-06-28 NOTE — Telephone Encounter (Signed)
Prior Berkley Harvey has been started for Dexilant 06/28/2013 Ag cma

## 2013-06-30 ENCOUNTER — Telehealth: Payer: Self-pay | Admitting: *Deleted

## 2013-06-30 NOTE — Telephone Encounter (Signed)
A prior auth was started 06/28/13 for dexilant I received notification today that the patient was approved for her prescription of Dexilant.  Ag cma

## 2013-08-25 ENCOUNTER — Encounter: Payer: Self-pay | Admitting: Family Medicine

## 2013-08-25 ENCOUNTER — Ambulatory Visit (INDEPENDENT_AMBULATORY_CARE_PROVIDER_SITE_OTHER): Payer: No Typology Code available for payment source | Admitting: Family Medicine

## 2013-08-25 VITALS — BP 100/68 | HR 70 | Temp 98.2°F | Wt 143.0 lb

## 2013-08-25 DIAGNOSIS — K5901 Slow transit constipation: Secondary | ICD-10-CM

## 2013-08-25 DIAGNOSIS — Z0000 Encounter for general adult medical examination without abnormal findings: Secondary | ICD-10-CM

## 2013-08-25 NOTE — Patient Instructions (Signed)

## 2013-08-25 NOTE — Progress Notes (Signed)
  Subjective:     Patricia Alvarez is a 52 y.o. female who presents for evaluation of constipation. Onset was several years ago. Patient has been having occasional pellet like stools per month. Defecation has been painful. Co-Morbid conditions:stress. Symptoms have waxed and waned. Current Health Habits: Eating fiber? yes - diet, Exercise? no, Adequate hydration? yes - . Current over the counter/prescription laxative: ducolax which has been effective.  The following portions of the patient's history were reviewed and updated as appropriate: allergies, current medications, past family history, past medical history, past social history, past surgical history and problem list.  Review of Systems Pertinent items are noted in HPI.   Objective:    BP 100/68  Pulse 70  Temp(Src) 98.2 F (36.8 C) (Oral)  Wt 143 lb (64.864 kg)  BMI 23.8 kg/m2  SpO2 98% General appearance: alert, cooperative, appears stated age and no distress Abdomen: soft, non-tender; bowel sounds normal; no masses,  no organomegaly   Assessment:    Chronic constipation   Plan:    Education about constipation causes and treatment discussed. linzess 145 mcg --samples given F/u gi if no better

## 2013-08-29 ENCOUNTER — Encounter: Payer: Self-pay | Admitting: Gastroenterology

## 2013-08-29 ENCOUNTER — Telehealth: Payer: Self-pay

## 2013-08-29 NOTE — Telephone Encounter (Signed)
Message copied by Arnette Norris on Mon Aug 29, 2013  2:11 PM ------      Message from: Lelon Perla      Created: Fri Aug 26, 2013  2:48 PM       Let pt know --that Dr stark said we can say preventative for colon and when she goes to prescreen visit the nurses will precert it      ----- Message -----         From: Meryl Dare, MD         Sent: 08/25/2013   5:40 PM           To: Lelon Perla, DO            If colonoscopy is performed for screening many plans have a lower or no deductible. If she has chronic, stable constipation it is perfectly reasonable to call it a >10 year interval routine screening.       The exact coverage depends on her insurance plan details as it varies from plan to plan. If you refer her for screening and she makes a nurse previsit appt we will have our staff review and precert with her insurance to have a better idea about the cost. We do this routinely.                        ----- Message -----         From: Lelon Perla, DO         Sent: 08/25/2013   4:01 PM           To: Meryl Dare, MD            The patient was in here asking for a colonoscopy referral--- her last one was done by you almost 11 years ago after hospitalization for an obstruction and per pt it was normal.  Would a repeat be considered diagnostic or preventative (as long as she is having no problems)?  If it is preventative it is free otherwise it costs her $1500.  Pt is asking and I was not sure of the answer.    Thanks Myrene Buddy.               ------

## 2013-08-29 NOTE — Telephone Encounter (Signed)
Patient has been made aware and voiced understanding, she will call Dr.Starke's office to schedule.      KP

## 2013-10-05 ENCOUNTER — Telehealth: Payer: Self-pay | Admitting: *Deleted

## 2013-10-05 MED ORDER — PANTOPRAZOLE SODIUM 40 MG PO TBEC: 40.0000 mg | DELAYED_RELEASE_TABLET | Freq: Every day | ORAL | Status: DC

## 2013-10-05 NOTE — Telephone Encounter (Signed)
Prior authorization was sent to office stating that the patient is willing to try pantoprazole 40mg  because it has a zero co-pay. Is it okay to switch medications. Please advise. SW

## 2013-10-05 NOTE — Telephone Encounter (Signed)
Yes-- 1 a day #30  11 refills

## 2013-10-05 NOTE — Telephone Encounter (Signed)
Med filled. Patient notified 

## 2013-10-12 ENCOUNTER — Ambulatory Visit (AMBULATORY_SURGERY_CENTER): Payer: Self-pay | Admitting: *Deleted

## 2013-10-12 VITALS — Ht 65.0 in | Wt 143.0 lb

## 2013-10-12 DIAGNOSIS — Z1211 Encounter for screening for malignant neoplasm of colon: Secondary | ICD-10-CM

## 2013-10-12 MED ORDER — NA SULFATE-K SULFATE-MG SULF 17.5-3.13-1.6 GM/177ML PO SOLN: ORAL | Status: DC

## 2013-10-12 NOTE — Progress Notes (Signed)
Patient states she was unable to finish prep solution in 2003, request better prep. Suprep given to patient and explained ways to help with drinking the prep. She understands. 24 hr # also given to patient if needed.

## 2013-10-12 NOTE — Progress Notes (Signed)
Patient denies any allergies to eggs or soy. Patient gets post-op nausea and vomiting. Patient denies any problems after colonoscopy.

## 2013-10-17 ENCOUNTER — Encounter: Payer: Self-pay | Admitting: Gastroenterology

## 2013-10-18 ENCOUNTER — Ambulatory Visit (INDEPENDENT_AMBULATORY_CARE_PROVIDER_SITE_OTHER): Payer: No Typology Code available for payment source

## 2013-10-18 ENCOUNTER — Ambulatory Visit (INDEPENDENT_AMBULATORY_CARE_PROVIDER_SITE_OTHER): Payer: No Typology Code available for payment source | Admitting: Podiatry

## 2013-10-18 ENCOUNTER — Encounter: Payer: Self-pay | Admitting: Podiatry

## 2013-10-18 VITALS — BP 87/47 | HR 72 | Resp 16

## 2013-10-18 DIAGNOSIS — M778 Other enthesopathies, not elsewhere classified: Secondary | ICD-10-CM

## 2013-10-18 DIAGNOSIS — M25579 Pain in unspecified ankle and joints of unspecified foot: Secondary | ICD-10-CM

## 2013-10-18 DIAGNOSIS — M25572 Pain in left ankle and joints of left foot: Secondary | ICD-10-CM

## 2013-10-18 DIAGNOSIS — M7750 Other enthesopathy of unspecified foot: Secondary | ICD-10-CM

## 2013-10-18 MED ORDER — MELOXICAM 15 MG PO TABS: 15.0000 mg | ORAL_TABLET | Freq: Every day | ORAL | Status: DC

## 2013-10-18 NOTE — Progress Notes (Signed)
She presents today with a 2 month duration of pain to her left ankle. She denies trauma to it is states it hurts right ear a she points to the distal fibula and just distal to the fibula in the sinus tarsi area. She states it hurts in bed and the pain seems to be positional.  Objective: Vital signs are stable she is alert and oriented x3. Pulses are strongly palpable. She has pain on palpation of the sinus tarsi and pain on in range of motion of the subtalar joint. 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  Assessment: Subtalar joint capsulitis care rule out early after 3 changes no facet of the subtalar joint left.  Plan: Injected the left subtalar joint. Discussed appropriate shoe gear stretching sizes and ice therapy wrote her prescription for Mobic and I will followup with her in one month.

## 2013-10-27 DIAGNOSIS — K635 Polyp of colon: Secondary | ICD-10-CM

## 2013-10-27 HISTORY — DX: Polyp of colon: K63.5

## 2013-10-27 HISTORY — PX: COLONOSCOPY W/ BIOPSIES: SHX1374

## 2013-11-03 ENCOUNTER — Ambulatory Visit (AMBULATORY_SURGERY_CENTER): Payer: No Typology Code available for payment source | Admitting: Gastroenterology

## 2013-11-03 ENCOUNTER — Encounter: Payer: Self-pay | Admitting: Gastroenterology

## 2013-11-03 VITALS — BP 92/62 | HR 70 | Temp 97.1°F | Resp 15 | Ht 65.0 in | Wt 143.0 lb

## 2013-11-03 DIAGNOSIS — D126 Benign neoplasm of colon, unspecified: Secondary | ICD-10-CM

## 2013-11-03 DIAGNOSIS — Z1211 Encounter for screening for malignant neoplasm of colon: Secondary | ICD-10-CM

## 2013-11-03 LAB — HM COLONOSCOPY

## 2013-11-03 MED ORDER — SODIUM CHLORIDE 0.9 % IV SOLN: 500.0000 mL | INTRAVENOUS | Status: DC

## 2013-11-03 NOTE — Op Note (Signed)
[facility]  [address] [place], [id number]   COLONOSCOPY PROCEDURE REPORT  PATIENT: Patricia Alvarez, Patricia Alvarez  MR#: [id number] BIRTHDATE: 1961/01/20 , 52  yrs. old GENDER: Female ENDOSCOPIST: Ladene Artist, MD, [facility] PROCEDURE DATE:  11/03/2013 PROCEDURE:   Colonoscopy with biopsy First Screening Colonoscopy - Avg.  risk and is 50 yrs.  old or older - No.  Prior Negative Screening - Now for repeat screening. 10 or more years since last screening  History of Adenoma - Now for follow-up colonoscopy & has been > or = to 3 yrs.  N/A  Polyps Removed Today? Yes. ASA CLASS:   Class II INDICATIONS:average risk screening. MEDICATIONS: MAC sedation, administered by CRNA and propofol (Diprivan) 300mg  IV DESCRIPTION OF PROCEDURE:   After the risks benefits and alternatives of the procedure were thoroughly explained, informed consent was obtained.  A digital rectal exam revealed no abnormalities of the rectum.   The LB [id number]  endoscope was introduced through the anus and advanced to the cecum, which was identified by both the appendix and ileocecal valve. No adverse events experienced.   The quality of the prep was Prepopik good The instrument was then slowly withdrawn as the colon was fully examined.  COLON FINDINGS: A sessile polyp measuring 3 mm in size was found at the cecum.  A polypectomy was performed with cold forceps.  The resection was complete and the polyp tissue was completely retrieved.   The colon was otherwise normal.  There was no diverticulosis, inflammation, polyps or cancers unless previously stated.  Retroflexed views revealed small internal hemorrhoids. The time to cecum=3 minutes 09 seconds.  Withdrawal time=12 minutes 27 seconds.  The scope was withdrawn and the procedure completed. COMPLICATIONS: There were no complications.  ENDOSCOPIC IMPRESSION: 1.   Sessile polyp measuring 3 mm in size was found at the cecum; polypectomy was performed  with cold forceps 2.   Small internal hemorrhoids  RECOMMENDATIONS: 1.  Await pathology results 2.  Repeat colonoscopy in 5 years if polyp adenomatous; otherwise 10 years  eSigned:  Ladene Artist, MD, [facility] 11/03/2013 9:04 AM

## 2013-11-03 NOTE — Patient Instructions (Signed)

## 2013-11-03 NOTE — Progress Notes (Signed)
Report to pacu rn, vss, bbs=clear 

## 2013-11-03 NOTE — Progress Notes (Signed)
Called to room to assist during endoscopic procedure.  Patient ID and intended procedure confirmed with present staff. Received instructions for my participation in the procedure from the performing physician.Called to room to assist during endoscopic procedure.  Patient ID and intended procedure confirmed with present staff. Received instructions for my participation in the procedure from the performing physician. 

## 2013-11-04 ENCOUNTER — Telehealth: Payer: Self-pay | Admitting: *Deleted

## 2013-11-04 NOTE — Telephone Encounter (Signed)
  Follow up Call-  Call back number 11/03/2013  Post procedure Call Back phone  # [phone]  Permission to leave phone message Yes     Patient questions:  Do you have a fever, pain , or abdominal swelling? no Pain Score  0 *  Have you tolerated food without any problems? yes  Have you been able to return to your normal activities? yes  Do you have any questions about your discharge instructions: Diet   no Medications  no Follow up visit  no  Do you have questions or concerns about your Care? no  Actions: * If pain score is 4 or above: No action needed, pain <4.

## 2013-11-10 ENCOUNTER — Encounter: Payer: Self-pay | Admitting: Gastroenterology

## 2013-11-15 ENCOUNTER — Ambulatory Visit: Payer: No Typology Code available for payment source | Admitting: Podiatry

## 2014-03-21 ENCOUNTER — Telehealth: Payer: Self-pay | Admitting: Family Medicine

## 2014-03-21 DIAGNOSIS — E538 Deficiency of other specified B group vitamins: Secondary | ICD-10-CM

## 2014-03-21 NOTE — Telephone Encounter (Signed)
Ok to check b12 level but it doesn't look like she has ever had a cpe.--- she should schedule that too

## 2014-03-21 NOTE — Telephone Encounter (Signed)
Caller name: Simrat  Call back [phone]   Reason for call:   Pt wants to start getting b12 injections again and knows that Dr. Etter Sjogren likes for her to have blood drawn to see if B12 is still needed.  Pt is wanting to get these lab orders placed to check B12.

## 2014-03-21 NOTE — Telephone Encounter (Signed)
Orders in. Please see below recommendations from the provider.      KP

## 2014-03-21 NOTE — Telephone Encounter (Signed)
Last seen 08/25/13. please advise if she will need a follow up before labs are drawn.      KP

## 2014-03-22 NOTE — Telephone Encounter (Signed)
Called pt schedule lab apt for b12 check.  Pt also needs to get scheduled for a yearly CPE.

## 2014-03-28 ENCOUNTER — Ambulatory Visit (INDEPENDENT_AMBULATORY_CARE_PROVIDER_SITE_OTHER): Payer: No Typology Code available for payment source

## 2014-03-28 DIAGNOSIS — E538 Deficiency of other specified B group vitamins: Secondary | ICD-10-CM

## 2014-03-28 LAB — VITAMIN B12: Vitamin B-12: 100 pg/mL — ABNORMAL LOW (ref 211–911)

## 2014-03-31 ENCOUNTER — Other Ambulatory Visit: Payer: Self-pay | Admitting: Family Medicine

## 2014-03-31 ENCOUNTER — Ambulatory Visit: Payer: No Typology Code available for payment source

## 2014-04-04 ENCOUNTER — Telehealth: Payer: Self-pay | Admitting: Family Medicine

## 2014-04-04 MED ORDER — CYANOCOBALAMIN 1000 MCG/ML IJ SOLN: 1000.0000 ug | INTRAMUSCULAR | Status: DC

## 2014-04-04 NOTE — Telephone Encounter (Signed)
That is fine---  b12 sq injections monthly

## 2014-04-04 NOTE — Telephone Encounter (Signed)
Please advise      KP 

## 2014-04-04 NOTE — Telephone Encounter (Signed)
Medication has been sent and the patient has been made aware.     KP

## 2014-04-04 NOTE — Telephone Encounter (Signed)
Caller name: Latrece  Call back [phone]   Reason for call:   Pt wants to know if she can get a script for her B12 and have her husband inject it.  She is unable to come to any of the available nurse visit slots due to work.  Please advise pt.

## 2014-04-14 LAB — HM PAP SMEAR

## 2014-04-17 ENCOUNTER — Other Ambulatory Visit: Payer: Self-pay | Admitting: Obstetrics and Gynecology

## 2014-04-17 DIAGNOSIS — N644 Mastodynia: Secondary | ICD-10-CM

## 2014-04-24 ENCOUNTER — Ambulatory Visit
Admission: RE | Admit: 2014-04-24 | Discharge: 2014-04-24 | Disposition: A | Discharge status: Home / Self Care | Payer: No Typology Code available for payment source | Source: Ambulatory Visit | Attending: Obstetrics and Gynecology | Admitting: Obstetrics and Gynecology

## 2014-04-24 ENCOUNTER — Other Ambulatory Visit: Payer: Self-pay | Admitting: Obstetrics and Gynecology

## 2014-04-24 DIAGNOSIS — N644 Mastodynia: Secondary | ICD-10-CM

## 2014-04-26 ENCOUNTER — Telehealth: Payer: Self-pay

## 2014-04-26 NOTE — Telephone Encounter (Signed)
Medication and allergies:  Reviewed and updated  90 day supply/mail order: n/a Local pharmacy:  [facility], [place]   Immunizations due:  Td/tdap   A/P: No changes to personal, family history or past surgical hx PAP- 04/14/14- normal--[facility]- 11/03/13- adenomatous polyp & small internal hemorrhoids--Dr. Fuller Plan; repeat in 5 years (11/03/2018) [facility]- 04/24/14- negative Tdap- 06/14/01---OVERDUE  To Discuss with Provider: Thinks she having issues with her gallbladder again.  Experiencing inside burning, pain between shoulder blades, and intermittent episodes of nausea.  She did not want to change her appointment.  Wants to keep physical appointment.

## 2014-04-27 ENCOUNTER — Encounter: Payer: Self-pay | Admitting: Family Medicine

## 2014-04-27 ENCOUNTER — Ambulatory Visit (INDEPENDENT_AMBULATORY_CARE_PROVIDER_SITE_OTHER): Payer: No Typology Code available for payment source | Admitting: Family Medicine

## 2014-04-27 VITALS — BP 96/72 | HR 69 | Temp 98.0°F | Ht 65.0 in | Wt 143.0 lb

## 2014-04-27 DIAGNOSIS — E785 Hyperlipidemia, unspecified: Secondary | ICD-10-CM

## 2014-04-27 DIAGNOSIS — Z0000 Encounter for general adult medical examination without abnormal findings: Secondary | ICD-10-CM

## 2014-04-27 DIAGNOSIS — Z23 Encounter for immunization: Secondary | ICD-10-CM

## 2014-04-27 DIAGNOSIS — E538 Deficiency of other specified B group vitamins: Secondary | ICD-10-CM

## 2014-04-27 DIAGNOSIS — R1011 Right upper quadrant pain: Secondary | ICD-10-CM

## 2014-04-27 NOTE — Progress Notes (Signed)
Pre visit review using our clinic review tool, if applicable. No additional management support is needed unless otherwise documented below in the visit note. 

## 2014-04-27 NOTE — Patient Instructions (Signed)
Preventive Care for Adults A healthy lifestyle and preventive care can promote health and wellness. Preventive health guidelines for women include the following key practices.  A routine yearly physical is a good way to check with your health care provider about your health and preventive screening. It is a chance to share any concerns and updates on your health and to receive a thorough exam.  Visit your dentist for a routine exam and preventive care every 6 months. Brush your teeth twice a day and floss once a day. Good oral hygiene prevents tooth decay and gum disease.  The frequency of eye exams is based on your age, health, family medical history, use of contact lenses, and other factors. Follow your health care provider's recommendations for frequency of eye exams.  Eat a healthy diet. Foods like vegetables, fruits, whole grains, low-fat dairy products, and lean protein foods contain the nutrients you need without too many calories. Decrease your intake of foods high in solid fats, added sugars, and salt. Eat the right amount of calories for you.Get information about a proper diet from your health care provider, if necessary.  Regular physical exercise is one of the most important things you can do for your health. Most adults should get at least 150 minutes of moderate-intensity exercise (any activity that increases your heart rate and causes you to sweat) each week. In addition, most adults need muscle-strengthening exercises on 2 or more days a week.  Maintain a healthy weight. The body mass index (BMI) is a screening tool to identify possible weight problems. It provides an estimate of body fat based on height and weight. Your health care provider can find your BMI, and can help you achieve or maintain a healthy weight.For adults 20 years and older:  A BMI below 18.5 is considered underweight.  A BMI of 18.5 to 24.9 is normal.  A BMI of 25 to 29.9 is considered overweight.  A BMI of  30 and above is considered obese.  Maintain normal blood lipids and cholesterol levels by exercising and minimizing your intake of saturated fat. Eat a balanced diet with plenty of fruit and vegetables. Blood tests for lipids and cholesterol should begin at age 52 and be repeated every 5 years. If your lipid or cholesterol levels are high, you are over 50, or you are at high risk for heart disease, you may need your cholesterol levels checked more frequently.Ongoing high lipid and cholesterol levels should be treated with medicines if diet and exercise are not working.  If you smoke, find out from your health care provider how to quit. If you do not use tobacco, do not start.  Lung cancer screening is recommended for adults aged 37-80 years who are at high risk for developing lung cancer because of a history of smoking. A yearly low-dose CT scan of the lungs is recommended for people who have at least a 30-pack-year history of smoking and are a current smoker or have quit within the past 15 years. A pack year of smoking is smoking an average of 1 pack of cigarettes a day for 1 year (for example: 1 pack a day for 30 years or 2 packs a day for 15 years). Yearly screening should continue until the smoker has stopped smoking for at least 15 years. Yearly screening should be stopped for people who develop a health problem that would prevent them from having lung cancer treatment.  If you are pregnant, do not drink alcohol. If you are breastfeeding,  be very cautious about drinking alcohol. If you are not pregnant and choose to drink alcohol, do not have more than 1 drink per day. One drink is considered to be 12 ounces (355 mL) of beer, 5 ounces (148 mL) of wine, or 1.5 ounces (44 mL) of liquor.  Avoid use of street drugs. Do not share needles with anyone. Ask for help if you need support or instructions about stopping the use of drugs.  High blood pressure causes heart disease and increases the risk of  stroke. Your blood pressure should be checked at least every 1 to 2 years. Ongoing high blood pressure should be treated with medicines if weight loss and exercise do not work.  If you are 3-86 years old, ask your health care provider if you should take aspirin to prevent strokes.  Diabetes screening involves taking a blood sample to check your fasting blood sugar level. This should be done once every 3 years, after age 67, if you are within normal weight and without risk factors for diabetes. Testing should be considered at a younger age or be carried out more frequently if you are overweight and have at least 1 risk factor for diabetes.  Breast cancer screening is essential preventive care for women. You should practice "breast self-awareness." This means understanding the normal appearance and feel of your breasts and may include breast self-examination. Any changes detected, no matter how small, should be reported to a health care provider. Women in their 8s and 30s should have a clinical breast exam (CBE) by a health care provider as part of a regular health exam every 1 to 3 years. After age 70, women should have a CBE every year. Starting at age 25, women should consider having a mammogram (breast X-ray test) every year. Women who have a family history of breast cancer should talk to their health care provider about genetic screening. Women at a high risk of breast cancer should talk to their health care providers about having an MRI and a mammogram every year.  Breast cancer gene (BRCA)-related cancer risk assessment is recommended for women who have family members with BRCA-related cancers. BRCA-related cancers include breast, ovarian, tubal, and peritoneal cancers. Having family members with these cancers may be associated with an increased risk for harmful changes (mutations) in the breast cancer genes BRCA1 and BRCA2. Results of the assessment will determine the need for genetic counseling and  BRCA1 and BRCA2 testing.  Routine pelvic exams to screen for cancer are no longer recommended for nonpregnant women who are considered low risk for cancer of the pelvic organs (ovaries, uterus, and vagina) and who do not have symptoms. Ask your health care provider if a screening pelvic exam is right for you.  If you have had past treatment for cervical cancer or a condition that could lead to cancer, you need Pap tests and screening for cancer for at least 20 years after your treatment. If Pap tests have been discontinued, your risk factors (such as having a new sexual partner) need to be reassessed to determine if screening should be resumed. Some women have medical problems that increase the chance of getting cervical cancer. In these cases, your health care provider may recommend more frequent screening and Pap tests.  The HPV test is an additional test that may be used for cervical cancer screening. The HPV test looks for the virus that can cause the cell changes on the cervix. The cells collected during the Pap test can be  tested for HPV. The HPV test could be used to screen women aged 47 years and older, and should be used in women of any age who have unclear Pap test results. After the age of 36, women should have HPV testing at the same frequency as a Pap test.  Colorectal cancer can be detected and often prevented. Most routine colorectal cancer screening begins at the age of 38 years and continues through age 58 years. However, your health care provider may recommend screening at an earlier age if you have risk factors for colon cancer. On a yearly basis, your health care provider may provide home test kits to check for hidden blood in the stool. Use of a small camera at the end of a tube, to directly examine the colon (sigmoidoscopy or colonoscopy), can detect the earliest forms of colorectal cancer. Talk to your health care provider about this at age 64, when routine screening begins. Direct  exam of the colon should be repeated every 5-10 years through age 21 years, unless early forms of pre-cancerous polyps or small growths are found.  People who are at an increased risk for hepatitis B should be screened for this virus. You are considered at high risk for hepatitis B if:  You were born in a country where hepatitis B occurs often. Talk with your health care provider about which countries are considered high risk.  Your parents were born in a high-risk country and you have not received a shot to protect against hepatitis B (hepatitis B vaccine).  You have HIV or AIDS.  You use needles to inject street drugs.  You live with, or have sex with, someone who has Hepatitis B.  You get hemodialysis treatment.  You take certain medicines for conditions like cancer, organ transplantation, and autoimmune conditions.  Hepatitis C blood testing is recommended for all people born from [birth year] through 1965 and any individual with known risks for hepatitis C.  Practice safe sex. Use condoms and avoid high-risk sexual practices to reduce the spread of sexually transmitted infections (STIs). STIs include gonorrhea, chlamydia, syphilis, trichomonas, herpes, HPV, and human immunodeficiency virus (HIV). Herpes, HIV, and HPV are viral illnesses that have no cure. They can result in disability, cancer, and death.  You should be screened for sexually transmitted illnesses (STIs) including gonorrhea and chlamydia if:  You are sexually active and are younger than 24 years.  You are older than 24 years and your health care provider tells you that you are at risk for this type of infection.  Your sexual activity has changed since you were last screened and you are at an increased risk for chlamydia or gonorrhea. Ask your health care provider if you are at risk.  If you are at risk of being infected with HIV, it is recommended that you take a prescription medicine daily to prevent HIV infection. This is  called preexposure prophylaxis (PrEP). You are considered at risk if:  You are a heterosexual woman, are sexually active, and are at increased risk for HIV infection.  You take drugs by injection.  You are sexually active with a partner who has HIV.  Talk with your health care provider about whether you are at high risk of being infected with HIV. If you choose to begin PrEP, you should first be tested for HIV. You should then be tested every 3 months for as long as you are taking PrEP.  Osteoporosis is a disease in which the bones lose minerals and strength  with aging. This can result in serious bone fractures or breaks. The risk of osteoporosis can be identified using a bone density scan. Women ages 65 years and over and women at risk for fractures or osteoporosis should discuss screening with their health care providers. Ask your health care provider whether you should take a calcium supplement or vitamin D to reduce the rate of osteoporosis.  Menopause can be associated with physical symptoms and risks. Hormone replacement therapy is available to decrease symptoms and risks. You should talk to your health care provider about whether hormone replacement therapy is right for you.  Use sunscreen. Apply sunscreen liberally and repeatedly throughout the day. You should seek shade when your shadow is shorter than you. Protect yourself by wearing long sleeves, pants, a wide-brimmed hat, and sunglasses year round, whenever you are outdoors.  Once a month, do a whole body skin exam, using a mirror to look at the skin on your back. Tell your health care provider of new moles, moles that have irregular borders, moles that are larger than a pencil eraser, or moles that have changed in shape or color.  Stay current with required vaccines (immunizations).  Influenza vaccine. All adults should be immunized every year.  Tetanus, diphtheria, and acellular pertussis (Td, Tdap) vaccine. Pregnant women should  receive 1 dose of Tdap vaccine during each pregnancy. The dose should be obtained regardless of the length of time since the last dose. Immunization is preferred during the 27th-36th week of gestation. An adult who has not previously received Tdap or who does not know her vaccine status should receive 1 dose of Tdap. This initial dose should be followed by tetanus and diphtheria toxoids (Td) booster doses every 10 years. Adults with an unknown or incomplete history of completing a 3-dose immunization series with Td-containing vaccines should begin or complete a primary immunization series including a Tdap dose. Adults should receive a Td booster every 10 years.  Varicella vaccine. An adult without evidence of immunity to varicella should receive 2 doses or a second dose if she has previously received 1 dose. Pregnant females who do not have evidence of immunity should receive the first dose after pregnancy. This first dose should be obtained before leaving the health care facility. The second dose should be obtained 4-8 weeks after the first dose.  Human papillomavirus (HPV) vaccine. Females aged 13-26 years who have not received the vaccine previously should obtain the 3-dose series. The vaccine is not recommended for use in pregnant females. However, pregnancy testing is not needed before receiving a dose. If a female is found to be pregnant after receiving a dose, no treatment is needed. In that case, the remaining doses should be delayed until after the pregnancy. Immunization is recommended for any person with an immunocompromised condition through the age of 26 years if she did not get any or all doses earlier. During the 3-dose series, the second dose should be obtained 4-8 weeks after the first dose. The third dose should be obtained 24 weeks after the first dose and 16 weeks after the second dose.  Zoster vaccine. One dose is recommended for adults aged 60 years or older unless certain conditions are  present.  Measles, mumps, and rubella (MMR) vaccine. Adults born before 1957 generally are considered immune to measles and mumps. Adults born in 1957 or later should have 1 or more doses of MMR vaccine unless there is a contraindication to the vaccine or there is laboratory evidence of immunity to   each of the three diseases. A routine second dose of MMR vaccine should be obtained at least 28 days after the first dose for students attending postsecondary schools, health care workers, or international travelers. People who received inactivated measles vaccine or an unknown type of measles vaccine during 1963-1967 should receive 2 doses of MMR vaccine. People who received inactivated mumps vaccine or an unknown type of mumps vaccine before 1979 and are at high risk for mumps infection should consider immunization with 2 doses of MMR vaccine. For females of childbearing age, rubella immunity should be determined. If there is no evidence of immunity, females who are not pregnant should be vaccinated. If there is no evidence of immunity, females who are pregnant should delay immunization until after pregnancy. Unvaccinated health care workers born before 1957 who lack laboratory evidence of measles, mumps, or rubella immunity or laboratory confirmation of disease should consider measles and mumps immunization with 2 doses of MMR vaccine or rubella immunization with 1 dose of MMR vaccine.  Pneumococcal 13-valent conjugate (PCV13) vaccine. When indicated, a person who is uncertain of her immunization history and has no record of immunization should receive the PCV13 vaccine. An adult aged 19 years or older who has certain medical conditions and has not been previously immunized should receive 1 dose of PCV13 vaccine. This PCV13 should be followed with a dose of pneumococcal polysaccharide (PPSV23) vaccine. The PPSV23 vaccine dose should be obtained at least 8 weeks after the dose of PCV13 vaccine. An adult aged 19  years or older who has certain medical conditions and previously received 1 or more doses of PPSV23 vaccine should receive 1 dose of PCV13. The PCV13 vaccine dose should be obtained 1 or more years after the last PPSV23 vaccine dose.  Pneumococcal polysaccharide (PPSV23) vaccine. When PCV13 is also indicated, PCV13 should be obtained first. All adults aged 65 years and older should be immunized. An adult younger than age 65 years who has certain medical conditions should be immunized. Any person who resides in a nursing home or long-term care facility should be immunized. An adult smoker should be immunized. People with an immunocompromised condition and certain other conditions should receive both PCV13 and PPSV23 vaccines. People with human immunodeficiency virus (HIV) infection should be immunized as soon as possible after diagnosis. Immunization during chemotherapy or radiation therapy should be avoided. Routine use of PPSV23 vaccine is not recommended for American Indians, [place] Natives, or people younger than 65 years unless there are medical conditions that require PPSV23 vaccine. When indicated, people who have unknown immunization and have no record of immunization should receive PPSV23 vaccine. One-time revaccination 5 years after the first dose of PPSV23 is recommended for people aged 19-64 years who have chronic kidney failure, nephrotic syndrome, asplenia, or immunocompromised conditions. People who received 1-2 doses of PPSV23 before age 65 years should receive another dose of PPSV23 vaccine at age 65 years or later if at least 5 years have passed since the previous dose. Doses of PPSV23 are not needed for people immunized with PPSV23 at or after age 65 years.  Meningococcal vaccine. Adults with asplenia or persistent complement component deficiencies should receive 2 doses of quadrivalent meningococcal conjugate (MenACWY-D) vaccine. The doses should be obtained at least 2 months apart.  Microbiologists working with certain meningococcal bacteria, military recruits, people at risk during an outbreak, and people who travel to or live in countries with a high rate of meningitis should be immunized. A first-year college student up through age   21 years who is living in a residence hall should receive a dose if she did not receive a dose on or after her 16th birthday. Adults who have certain high-risk conditions should receive one or more doses of vaccine.  Hepatitis A vaccine. Adults who wish to be protected from this disease, have certain high-risk conditions, work with hepatitis A-infected animals, work in hepatitis A research labs, or travel to or work in countries with a high rate of hepatitis A should be immunized. Adults who were previously unvaccinated and who anticipate close contact with an international adoptee during the first 60 days after arrival in the [place] from a country with a high rate of hepatitis A should be immunized.  Hepatitis B vaccine. Adults who wish to be protected from this disease, have certain high-risk conditions, may be exposed to blood or other infectious body fluids, are household contacts or sex partners of hepatitis B positive people, are clients or workers in certain care facilities, or travel to or work in countries with a high rate of hepatitis B should be immunized.  Haemophilus influenzae type b (Hib) vaccine. A previously unvaccinated person with asplenia or sickle cell disease or having a scheduled splenectomy should receive 1 dose of Hib vaccine. Regardless of previous immunization, a recipient of a hematopoietic stem cell transplant should receive a 3-dose series 6-12 months after her successful transplant. Hib vaccine is not recommended for adults with HIV infection. Preventive Services / Frequency Ages 43 to 39years  Blood pressure check.** / Every 1 to 2 years.  Lipid and cholesterol check.** / Every 5 years beginning at age  75.  Clinical breast exam.** / Every 3 years for women in their 32s and 74s.  BRCA-related cancer risk assessment.** / For women who have family members with a BRCA-related cancer (breast, ovarian, tubal, or peritoneal cancers).  Pap test.** / Every 2 years from ages 65 through 91. Every 3 years starting at age 34 through age 93 or 72 with a history of 3 consecutive normal Pap tests.  HPV screening.** / Every 3 years from ages 46 through ages 53 to 26 with a history of 3 consecutive normal Pap tests.  Hepatitis C blood test.** / For any individual with known risks for hepatitis C.  Skin self-exam. / Monthly.  Influenza vaccine. / Every year.  Tetanus, diphtheria, and acellular pertussis (Tdap, Td) vaccine.** / Consult your health care provider. Pregnant women should receive 1 dose of Tdap vaccine during each pregnancy. 1 dose of Td every 10 years.  Varicella vaccine.** / Consult your health care provider. Pregnant females who do not have evidence of immunity should receive the first dose after pregnancy.  HPV vaccine. / 3 doses over 6 months, if 70 and younger. The vaccine is not recommended for use in pregnant females. However, pregnancy testing is not needed before receiving a dose.  Measles, mumps, rubella (MMR) vaccine.** / You need at least 1 dose of MMR if you were born in 1957 or later. You may also need a 2nd dose. For females of childbearing age, rubella immunity should be determined. If there is no evidence of immunity, females who are not pregnant should be vaccinated. If there is no evidence of immunity, females who are pregnant should delay immunization until after pregnancy.  Pneumococcal 13-valent conjugate (PCV13) vaccine.** / Consult your health care provider.  Pneumococcal polysaccharide (PPSV23) vaccine.** / 1 to 2 doses if you smoke cigarettes or if you have certain conditions.  Meningococcal vaccine.** /  1 dose if you are age 70 to 51 years and a [place] living in a residence hall, or have one of several medical conditions, you need to get vaccinated against meningococcal disease. You may also need additional booster doses.  Hepatitis A vaccine.** / Consult your health care provider.  Hepatitis B vaccine.** / Consult your health care provider.  Haemophilus influenzae type b (Hib) vaccine.** / Consult your health care provider. Ages 40 to 64years  Blood pressure check.** / Every 1 to 2 years.  Lipid and cholesterol check.** / Every 5 years beginning at age 58 years.  Lung cancer screening. / Every year if you are aged 56-80 years and have a 30-pack-year history of smoking and currently smoke or have quit within the past 15 years. Yearly screening is stopped once you have quit smoking for at least 15 years or develop a health problem that would prevent you from having lung cancer treatment.  Clinical breast exam.** / Every year after age 35 years.  BRCA-related cancer risk assessment.** / For women who have family members with a BRCA-related cancer (breast, ovarian, tubal, or peritoneal cancers).  Mammogram.** / Every year beginning at age 109 years and continuing for as long as you are in good health. Consult with your health care provider.  Pap test.** / Every 3 years starting at age 44 years through age 94 or 70 years with a history of 3 consecutive normal Pap tests.  HPV screening.** / Every 3 years from ages 109 years through ages 50 to 30 years with a history of 3 consecutive normal Pap tests.  Fecal occult blood test (FOBT) of stool. / Every year beginning at age 73 years and continuing until age 59 years. You may not need to do this test if you get a colonoscopy every 10 years.  Flexible sigmoidoscopy or colonoscopy.** / Every 5 years for a flexible sigmoidoscopy or every 10 years for a colonoscopy beginning at age 68 years and continuing until age 12 years.  Hepatitis C blood test.** / For all people born from [birth year] through  1965 and any individual with known risks for hepatitis C.  Skin self-exam. / Monthly.  Influenza vaccine. / Every year.  Tetanus, diphtheria, and acellular pertussis (Tdap/Td) vaccine.** / Consult your health care provider. Pregnant women should receive 1 dose of Tdap vaccine during each pregnancy. 1 dose of Td every 10 years.  Varicella vaccine.** / Consult your health care provider. Pregnant females who do not have evidence of immunity should receive the first dose after pregnancy.  Zoster vaccine.** / 1 dose for adults aged 2 years or older.  Measles, mumps, rubella (MMR) vaccine.** / You need at least 1 dose of MMR if you were born in 1957 or later. You may also need a 2nd dose. For females of childbearing age, rubella immunity should be determined. If there is no evidence of immunity, females who are not pregnant should be vaccinated. If there is no evidence of immunity, females who are pregnant should delay immunization until after pregnancy.  Pneumococcal 13-valent conjugate (PCV13) vaccine.** / Consult your health care provider.  Pneumococcal polysaccharide (PPSV23) vaccine.** / 1 to 2 doses if you smoke cigarettes or if you have certain conditions.  Meningococcal vaccine.** / Consult your health care provider.  Hepatitis A vaccine.** / Consult your health care provider.  Hepatitis B vaccine.** / Consult your health care provider.  Haemophilus influenzae type b (Hib) vaccine.** / Consult your health care provider. Ages 48 years  and over  Blood pressure check.** / Every 1 to 2 years.  Lipid and cholesterol check.** / Every 5 years beginning at age 84 years.  Lung cancer screening. / Every year if you are aged 50-80 years and have a 30-pack-year history of smoking and currently smoke or have quit within the past 15 years. Yearly screening is stopped once you have quit smoking for at least 15 years or develop a health problem that would prevent you from having lung cancer  treatment.  Clinical breast exam.** / Every year after age 24 years.  BRCA-related cancer risk assessment.** / For women who have family members with a BRCA-related cancer (breast, ovarian, tubal, or peritoneal cancers).  Mammogram.** / Every year beginning at age 14 years and continuing for as long as you are in good health. Consult with your health care provider.  Pap test.** / Every 3 years starting at age 17 years through age 31 or 74 years with 3 consecutive normal Pap tests. Testing can be stopped between 65 and 70 years with 3 consecutive normal Pap tests and no abnormal Pap or HPV tests in the past 10 years.  HPV screening.** / Every 3 years from ages 30 years through ages 70 or 28 years with a history of 3 consecutive normal Pap tests. Testing can be stopped between 65 and 70 years with 3 consecutive normal Pap tests and no abnormal Pap or HPV tests in the past 10 years.  Fecal occult blood test (FOBT) of stool. / Every year beginning at age 64 years and continuing until age 92 years. You may not need to do this test if you get a colonoscopy every 10 years.  Flexible sigmoidoscopy or colonoscopy.** / Every 5 years for a flexible sigmoidoscopy or every 10 years for a colonoscopy beginning at age 73 years and continuing until age 39 years.  Hepatitis C blood test.** / For all people born from [birth year] through 1964 and any individual with known risks for hepatitis C.  Osteoporosis screening.** / A one-time screening for women ages 35 years and over and women at risk for fractures or osteoporosis.  Skin self-exam. / Monthly.  Influenza vaccine. / Every year.  Tetanus, diphtheria, and acellular pertussis (Tdap/Td) vaccine.** / 1 dose of Td every 10 years.  Varicella vaccine.** / Consult your health care provider.  Zoster vaccine.** / 1 dose for adults aged 59 years or older.  Pneumococcal 13-valent conjugate (PCV13) vaccine.** / Consult your health care provider.  Pneumococcal  polysaccharide (PPSV23) vaccine.** / 1 dose for all adults aged 8 years and older.  Meningococcal vaccine.** / Consult your health care provider.  Hepatitis A vaccine.** / Consult your health care provider.  Hepatitis B vaccine.** / Consult your health care provider.  Haemophilus influenzae type b (Hib) vaccine.** / Consult your health care provider. ** Family history and personal history of risk and conditions may change your health care provider's recommendations. Document Released: 12/09/2001 Document Revised: 10/18/2013 Document Reviewed: 03/10/2011 [facility] Patient Information 2015 [place], [place]. This information is not intended to replace advice given to you by your health care provider. Make sure you discuss any questions you have with your health care provider.

## 2014-04-27 NOTE — Progress Notes (Signed)
Subjective:     Patricia Alvarez is a 53 y.o. female and is here for a comprehensive physical exam. The patient reports problems - RUQ pain .  History   Social History  . Marital Status: Married    Spouse Name: N/A    Number of Children: N/A  . Years of Education: N/A   Occupational History  . paralegal    Social History Main Topics  . Smoking status: Never Smoker   . Smokeless tobacco: Never Used  . Alcohol Use: No  . Drug Use: No  . Sexual Activity: Not on file   Other Topics Concern  . Not on file   Social History Narrative  . No narrative on file   Health Maintenance  Topic Date Due  . Influenza Vaccine  05/27/2014  . Mammogram  04/24/2016  . Pap Smear  04/14/2017  . Colonoscopy  11/03/2018  . Tetanus/tdap  04/27/2024    The following portions of the patient's history were reviewed and updated as appropriate:  She  has a past medical history of Hyperlipidemia; Vitamin D deficiency; Allergic rhinitis; Back pain; Endometriosis of uterus; B12 deficiency; and Internal hemorrhoid. She  does not have any pertinent problems on file. She  has past surgical history that includes Appendectomy; Abdominal hysterectomy; and laparscopies for endometriosis. Her family history includes Colon polyps in her father; Heart failure (age of onset: 28) in her mother; Hyperlipidemia (age of onset: 13) in her mother. There is no history of Colon cancer. She  reports that she has never smoked. She has never used smokeless tobacco. She reports that she does not drink alcohol or use illicit drugs. She has a current medication list which includes the following prescription(s): cyanocobalamin, pantoprazole, and estrogen-methyltestosterone. Current Outpatient Prescriptions on File Prior to Visit  Medication Sig Dispense Refill  . cyanocobalamin (,VITAMIN B-12,) 1000 MCG/ML injection Inject 1 mL (1,000 mcg total) into the muscle every 30 (thirty) days.  10 mL  0  . pantoprazole (PROTONIX) 40 MG  tablet Take 1 tablet (40 mg total) by mouth daily.  30 tablet  11  . estrogen-methylTESTOSTERone (ESTRATEST) 1.25-2.5 MG per tablet Take 1 tablet by mouth daily.         No current facility-administered medications on file prior to visit.   She is allergic to codeine; morphine; and prednisone..  Review of Systems Review of Systems  Constitutional: Negative for activity change, appetite change and fatigue.  HENT: Negative for hearing loss, congestion, tinnitus and ear discharge.  dentist q48m Eyes: Negative for visual disturbance (see optho q1y -- vision corrected to 20/20 with glasses).  Respiratory: Negative for cough, chest tightness and shortness of breath.   Cardiovascular: Negative for chest pain, palpitations and leg swelling.  Gastrointestinal: Negative for abdominal pain, diarrhea, constipation and abdominal distention.  Genitourinary: Negative for urgency, frequency, decreased urine volume and difficulty urinating.  Musculoskeletal: Negative for back pain, arthralgias and gait problem.  Skin: Negative for color change, pallor and rash.  Neurological: Negative for dizziness, light-headedness, numbness and headaches.  Hematological: Negative for adenopathy. Does not bruise/bleed easily.  Psychiatric/Behavioral: Negative for suicidal ideas, confusion, sleep disturbance, self-injury, dysphoric mood, decreased concentration and agitation.       Objective:    BP 96/72  Pulse 69  Temp(Src) 98 F (36.7 C) (Oral)  Ht 5\' 5"  (1.651 m)  Wt 143 lb (64.864 kg)  BMI 23.80 kg/m2  SpO2 98% General appearance: alert, cooperative, appears stated age and no distress Head: Normocephalic, without obvious  abnormality, atraumatic Eyes: conjunctivae/corneas clear. PERRL, EOM's intact. Fundi benign. Ears: normal TM's and external ear canals both ears Nose: Nares normal. Septum midline. Mucosa normal. No drainage or sinus tenderness. Throat: lips, mucosa, and tongue normal; teeth and gums  normal Neck: no adenopathy, no carotid bruit, no JVD, supple, symmetrical, trachea midline and thyroid not enlarged, symmetric, no tenderness/mass/nodules Back: symmetric, no curvature. ROM normal. No CVA tenderness. Lungs: clear to auscultation bilaterally Breasts: gyn Heart: regular rate and rhythm, S1, S2 normal, no murmur, click, rub or gallop Abdomen: abnormal findings:  mild tenderness in the RUQ Pelvic: deferred--gyn Extremities: extremities normal, atraumatic, no cyanosis or edema Pulses: 2+ and symmetric Skin: Skin color, texture, turgor normal. No rashes or lesions Lymph nodes: Cervical, supraclavicular, and axillary nodes normal. Neurologic: Alert and oriented X 3, normal strength and tone. Normal symmetric reflexes. Normal coordination and gait Psych-  No depression , no anxiety      Assessment:    Healthy female exam.      Plan:    ghm utd Labs done at gyn--- pt will get labs for [place] See After Visit Summary for Counseling Recommendations   1. B12 deficiency   - CBC with Differential; Future - Vitamin B12; Future  2. RUQ pain   - US Abdomen Complete; Future  3. Preventative health care    4. Other and unspecified hyperlipidemia   - Basic metabolic panel; Future - Hepatic function panel; Future - Lipid panel; Future  5. Need for Tdap vaccination   - Tdap vaccine greater than or equal to 7yo IM

## 2014-05-04 ENCOUNTER — Ambulatory Visit
Admission: RE | Admit: 2014-05-04 | Discharge: 2014-05-04 | Disposition: A | Discharge status: Home / Self Care | Payer: No Typology Code available for payment source | Source: Ambulatory Visit | Attending: Family Medicine | Admitting: Family Medicine

## 2014-05-04 DIAGNOSIS — R1011 Right upper quadrant pain: Secondary | ICD-10-CM

## 2014-05-09 ENCOUNTER — Encounter: Payer: Self-pay | Admitting: *Deleted

## 2014-05-09 ENCOUNTER — Telehealth: Payer: Self-pay | Admitting: Gastroenterology

## 2014-05-09 NOTE — Telephone Encounter (Signed)
Patient is scheduled to see Tye Savoy RNP tomorrow at 2:00 for abdominal and epigastric pain

## 2014-05-11 ENCOUNTER — Ambulatory Visit: Payer: No Typology Code available for payment source | Admitting: Nurse Practitioner

## 2014-05-23 ENCOUNTER — Other Ambulatory Visit: Payer: No Typology Code available for payment source

## 2014-05-24 ENCOUNTER — Ambulatory Visit: Payer: No Typology Code available for payment source | Admitting: Gastroenterology

## 2014-07-17 ENCOUNTER — Ambulatory Visit: Payer: No Typology Code available for payment source | Admitting: Gastroenterology

## 2014-09-05 ENCOUNTER — Other Ambulatory Visit: Payer: Self-pay

## 2014-09-05 MED ORDER — CYANOCOBALAMIN 1000 MCG/ML IJ SOLN: 1000.0000 ug | INTRAMUSCULAR | Status: DC

## 2014-11-16 ENCOUNTER — Other Ambulatory Visit: Payer: Self-pay | Admitting: Family Medicine

## 2014-11-20 NOTE — Telephone Encounter (Signed)
Ov due July 2016

## 2014-11-20 NOTE — Telephone Encounter (Signed)
Pantoprazole refill sent to pharmacy. Pt last seen by PCP 04/2014 and has no follow ups scheduled.  When should pt follow up in the office?

## 2014-11-21 ENCOUNTER — Other Ambulatory Visit (INDEPENDENT_AMBULATORY_CARE_PROVIDER_SITE_OTHER): Payer: No Typology Code available for payment source

## 2014-11-21 ENCOUNTER — Encounter: Payer: Self-pay | Admitting: Gastroenterology

## 2014-11-21 ENCOUNTER — Ambulatory Visit (INDEPENDENT_AMBULATORY_CARE_PROVIDER_SITE_OTHER): Payer: No Typology Code available for payment source | Admitting: Gastroenterology

## 2014-11-21 VITALS — BP 90/66 | HR 76 | Ht 64.5 in | Wt 146.0 lb

## 2014-11-21 DIAGNOSIS — R140 Abdominal distension (gaseous): Secondary | ICD-10-CM

## 2014-11-21 DIAGNOSIS — K5901 Slow transit constipation: Secondary | ICD-10-CM

## 2014-11-21 DIAGNOSIS — K219 Gastro-esophageal reflux disease without esophagitis: Secondary | ICD-10-CM

## 2014-11-21 LAB — CBC WITH DIFFERENTIAL/PLATELET
Basophils Absolute: 0 10*3/uL (ref 0.0–0.1)
Basophils Relative: 0.6 % (ref 0.0–3.0)
Eosinophils Absolute: 0.1 10*3/uL (ref 0.0–0.7)
Eosinophils Relative: 2.7 % (ref 0.0–5.0)
HCT: 39 % (ref 36.0–46.0)
HEMOGLOBIN: 13.6 g/dL (ref 12.0–15.0)
LYMPHS PCT: 36.1 % (ref 12.0–46.0)
Lymphs Abs: 1.5 10*3/uL (ref 0.7–4.0)
MCHC: 34.9 g/dL (ref 30.0–36.0)
MCV: 90.5 fl (ref 78.0–100.0)
MONO ABS: 0.3 10*3/uL (ref 0.1–1.0)
Monocytes Relative: 7.5 % (ref 3.0–12.0)
NEUTROS ABS: 2.2 10*3/uL (ref 1.4–7.7)
Neutrophils Relative %: 53.1 % (ref 43.0–77.0)
Platelets: 243 10*3/uL (ref 150.0–400.0)
RBC: 4.31 Mil/uL (ref 3.87–5.11)
RDW: 13.8 % (ref 11.5–15.5)
WBC: 4.1 10*3/uL (ref 4.0–10.5)

## 2014-11-21 LAB — TSH: TSH: 3.58 u[IU]/mL (ref 0.35–4.50)

## 2014-11-21 MED ORDER — OMEPRAZOLE 40 MG PO CPDR: 40.0000 mg | DELAYED_RELEASE_CAPSULE | Freq: Every day | ORAL | Status: DC

## 2014-11-21 NOTE — Progress Notes (Signed)
History of Present Illness: This is a 54 year old female with chronic GERD. When she misses her PPI for 1-2 days she has severe recurrent symptoms. She is concerned about remaining on an acid reducing medication long-term. She is concerned that Protonix is causing her abdominal bloating. She has noted a gradual 10 pound weight gain over the past year. She has chronic constipation and ongoing abdominal bloating. Colonoscopy performed in January 2015 revealed a small adenomatous colon polyp and internal hemorrhoids.Denies weight loss, abdominal pain, diarrhea, change in stool caliber, melena, hematochezia, nausea, vomiting, dysphagia, chest pain.    Allergies  Allergen Reactions  . Codeine Itching and Nausea Only    REACTION: nausea  . Morphine Nausea Only    REACTION: nausea  . Prednisone     REACTION: Hyperactive   Outpatient Prescriptions Prior to Visit  Medication Sig Dispense Refill  . estrogen-methylTESTOSTERone (ESTRATEST) 1.25-2.5 MG per tablet Take 1 tablet by mouth daily.      . pantoprazole (PROTONIX) 40 MG tablet TAKE 1 TABLET BY MOUTH DAILY. 30 tablet 3  . cyanocobalamin (,VITAMIN B-12,) 1000 MCG/ML injection Inject 1 mL (1,000 mcg total) into the muscle every 30 (thirty) days. (Patient not taking: Reported on 11/21/2014) 10 mL 0   No facility-administered medications prior to visit.   Past Medical History  Diagnosis Date  . Hyperlipidemia   . Vitamin D deficiency   . Allergic rhinitis   . Back pain   . Endometriosis of uterus   . B12 deficiency   . Internal hemorrhoid   . Colon polyp 2015    TUBULAR ADENOMA (X 1).  . IBS (irritable bowel syndrome)    Past Surgical History  Procedure Laterality Date  . Appendectomy  02/2000    and ovary  . Partial hysterectomy  1994    right ovary and uterus  . Laparoscopy    . Colonoscopy w/ biopsies  2015    multiple  . Esophagogastroduodenoscopy  2011    multiple  . Oophorectomy Left 02/2000    w/appendectomy   History    Social History  . Marital Status: Married    Spouse Name: N/A    Number of Children: 2  . Years of Education: N/A   Occupational History  . paralegal    Social History Main Topics  . Smoking status: Never Smoker   . Smokeless tobacco: Never Used  . Alcohol Use: No  . Drug Use: No  . Sexual Activity: None   Other Topics Concern  . None   Social History Narrative   Family History  Problem Relation Age of Onset  . Hyperlipidemia Mother 90  . Heart failure Mother 76  . Colon polyps Father   . Colon cancer Neg Hx   . Breast cancer Maternal Grandmother     Physical Exam: General: Well developed , well nourished, no acute distress Head: Normocephalic and atraumatic Eyes:  sclerae anicteric, EOMI Ears: Normal auditory acuity Mouth: No deformity or lesions Lungs: Clear throughout to auscultation Heart: Regular rate and rhythm; no murmurs, rubs or bruits Abdomen: Soft, non tender and non distended. No masses, hepatosplenomegaly or hernias noted. Normal Bowel sounds Musculoskeletal: Symmetrical with no gross deformities  Pulses:  Normal pulses noted Extremities: No clubbing, cyanosis, edema or deformities noted Neurological: Alert oriented x 4, grossly nonfocal Psychological:  Alert and cooperative. Normal mood and affect   Assessment and Recommendations:  1. GERD. Continue Protonix 40 mg daily and standard antireflux measures. Should have interval bone density testing  done by her primary physician. I attempted to answer all her questions and address or concerns about long-term treatment for GERD. She has substantial GERD symptoms when she is not on medications and it is not practical not treated. Trial of omeprazole 40 mg daily to help determine if Protonix might be contributing to her abdominal bloating, although I think it is unlikely.   2. Constipation, abdominal bloating. CMP, CBC, TSH. MiraLAX 1-2 times daily, every day, titrated for adequate daily bowel movements.  Gas-X 1 to 4 times a day as needed. Return office visit in 1 month.  3. Personal history of adenomatous colon polyps. Five-year interval surveillance colonoscopy recommended January 2020.

## 2014-11-21 NOTE — Patient Instructions (Addendum)
Your physician has requested that you go to the basement for the following lab work before leaving today:[facility] panel.  Stop taking your Protonix. We have sent the following medications to your pharmacy for you to pick up at your convenience:omeprazole.  Start over the counter Miralax mixing 17 grams in 8 oz of water 1-2 x daily everyday.   Take over the counter Gas-X four times a day as needed for gas and bloating.   Please follow up with Dr. Fuller Plan on 12/25/14 at 10:45am. If you need to reschedule or cancel please call [phone].  Thank you for choosing me and [facility].  Pricilla Riffle. Dagoberto Ligas., MD., Marval Regal

## 2014-11-22 LAB — BASIC METABOLIC PANEL
BUN: 9 mg/dL (ref 6–23)
CO2: 28 mEq/L (ref 19–32)
CREATININE: 0.67 mg/dL (ref 0.40–1.20)
Calcium: 8.8 mg/dL (ref 8.4–10.5)
Chloride: 104 mEq/L (ref 96–112)
GFR: 98.16 mL/min (ref >60.00)
Glucose, Bld: 92 mg/dL (ref 70–99)
Potassium: 4 mEq/L (ref 3.5–5.1)
Sodium: 137 mEq/L (ref 135–145)

## 2014-11-22 LAB — HEPATIC FUNCTION PANEL
ALK PHOS: 51 U/L (ref 39–117)
ALT: 9 U/L (ref 0–35)
AST: 16 U/L (ref 0–37)
Albumin: 4 g/dL (ref 3.5–5.2)
BILIRUBIN DIRECT: 0.1 mg/dL (ref 0.0–0.3)
Total Bilirubin: 0.4 mg/dL (ref 0.2–1.2)
Total Protein: 6.6 g/dL (ref 6.0–8.3)

## 2014-12-25 ENCOUNTER — Ambulatory Visit: Payer: No Typology Code available for payment source | Admitting: Gastroenterology

## 2015-03-01 ENCOUNTER — Encounter: Payer: Self-pay | Admitting: Gastroenterology

## 2015-03-28 ENCOUNTER — Other Ambulatory Visit: Payer: Self-pay

## 2015-03-28 DIAGNOSIS — Z1231 Encounter for screening mammogram for malignant neoplasm of breast: Secondary | ICD-10-CM

## 2015-05-08 ENCOUNTER — Ambulatory Visit
Admission: RE | Admit: 2015-05-08 | Discharge: 2015-05-08 | Disposition: A | Discharge status: Home / Self Care | Payer: No Typology Code available for payment source | Source: Ambulatory Visit

## 2015-05-08 DIAGNOSIS — Z1231 Encounter for screening mammogram for malignant neoplasm of breast: Secondary | ICD-10-CM

## 2015-10-09 ENCOUNTER — Encounter: Payer: Self-pay | Admitting: Physician Assistant

## 2015-10-09 ENCOUNTER — Other Ambulatory Visit (INDEPENDENT_AMBULATORY_CARE_PROVIDER_SITE_OTHER): Payer: BLUE CROSS/BLUE SHIELD

## 2015-10-09 ENCOUNTER — Ambulatory Visit (INDEPENDENT_AMBULATORY_CARE_PROVIDER_SITE_OTHER): Payer: BLUE CROSS/BLUE SHIELD | Admitting: Physician Assistant

## 2015-10-09 VITALS — BP 124/80 | HR 78 | Ht 64.5 in | Wt 144.0 lb

## 2015-10-09 DIAGNOSIS — R1031 Right lower quadrant pain: Secondary | ICD-10-CM

## 2015-10-09 DIAGNOSIS — K219 Gastro-esophageal reflux disease without esophagitis: Secondary | ICD-10-CM | POA: Diagnosis not present

## 2015-10-09 DIAGNOSIS — R1084 Generalized abdominal pain: Secondary | ICD-10-CM

## 2015-10-09 DIAGNOSIS — R110 Nausea: Secondary | ICD-10-CM

## 2015-10-09 LAB — COMPREHENSIVE METABOLIC PANEL
ALBUMIN: 4.3 g/dL (ref 3.5–5.2)
ALK PHOS: 58 U/L (ref 39–117)
ALT: 8 U/L (ref 0–35)
AST: 17 U/L (ref 0–37)
BUN: 12 mg/dL (ref 6–23)
CALCIUM: 9.7 mg/dL (ref 8.4–10.5)
CHLORIDE: 104 meq/L (ref 96–112)
CO2: 31 mEq/L (ref 19–32)
CREATININE: 0.69 mg/dL (ref 0.40–1.20)
GFR: 94.56 mL/min (ref >60.00)
Glucose, Bld: 97 mg/dL (ref 70–99)
Potassium: 6 mEq/L — ABNORMAL HIGH (ref 3.5–5.1)
SODIUM: 142 meq/L (ref 135–145)
TOTAL PROTEIN: 6.7 g/dL (ref 6.0–8.3)
Total Bilirubin: 0.6 mg/dL (ref 0.2–1.2)

## 2015-10-09 LAB — CBC WITH DIFFERENTIAL/PLATELET
BASOS PCT: 0.6 % (ref 0.0–3.0)
Basophils Absolute: 0 10*3/uL (ref 0.0–0.1)
EOS ABS: 0.1 10*3/uL (ref 0.0–0.7)
EOS PCT: 1.1 % (ref 0.0–5.0)
HEMATOCRIT: 42.1 % (ref 36.0–46.0)
HEMOGLOBIN: 14.1 g/dL (ref 12.0–15.0)
LYMPHS PCT: 37.9 % (ref 12.0–46.0)
Lymphs Abs: 1.9 10*3/uL (ref 0.7–4.0)
MCHC: 33.5 g/dL (ref 30.0–36.0)
MCV: 93.8 fl (ref 78.0–100.0)
Monocytes Absolute: 0.3 10*3/uL (ref 0.1–1.0)
Monocytes Relative: 6.5 % (ref 3.0–12.0)
Neutro Abs: 2.7 10*3/uL (ref 1.4–7.7)
Neutrophils Relative %: 53.9 % (ref 43.0–77.0)
Platelets: 266 10*3/uL (ref 150.0–400.0)
RBC: 4.49 Mil/uL (ref 3.87–5.11)
RDW: 13.9 % (ref 11.5–15.5)
WBC: 5.1 10*3/uL (ref 4.0–10.5)

## 2015-10-09 LAB — LIPASE: LIPASE: 85 U/L — AB (ref 11.0–59.0)

## 2015-10-09 MED ORDER — FAMOTIDINE 40 MG PO TABS: 40.0000 mg | ORAL_TABLET | Freq: Two times a day (BID) | ORAL | Status: DC

## 2015-10-09 NOTE — Progress Notes (Signed)
Reviewed and agree with management plan.  Michele Kerlin T. Coral Soler, MD FACG 

## 2015-10-09 NOTE — Progress Notes (Signed)
Patient ID: [name], female   DOB: [birth year], 54 y.o.   MRN: [id number]   Subjective:    Patient ID: Patricia Alvarez, female    DOB: [birth year], 54 y.o.   MRN: [id number]  HPI  Patricia Alvarez is a pleasant 54 year old white female known to Dr. Fuller Alvarez. She has history of chronic GERD and constipation. She had undergone colonoscopy in January 2015 with finding of one small adenomatous polyp and internal hemorrhoids. Sign She comes in today with complaints of her reflux symptoms being "back with a vengeance". She says she went off of Protonix in February 2016 because she had read about the possible connection with dementia and memory loss. She says her father adds severe dementia and she worries about any potential contributing factors. Patricia Alvarez She says she's been taking Tums on a regular basis but is having ongoing problems with heartburn indigestion on a daily basis. No dysphagia or odynophagia. She also has had 3 recent episodes of mid abdominal pain which has come on acutely and is associated with abdominal distention and swelling. She says the pain is fairly intense at times and almost doubles her over. S will last for several hours then she gets a gurgling sensation in her abdomen and this seems to release things she'll then have a bowel movement and gradually the symptoms dissipate. Last episode was about a week and half ago. States that she was hospitalized with a partial small bowel obstruction about 15 years ago, etiology was not clear but resolved with conservative measures. She is status post abdominal hysterectomy in 2011 and also had a prior abdominoplasty.  Review of Systems Pertinent positive and negative review of systems were noted in the above HPI section.  All other review of systems was otherwise negative.  Outpatient Encounter Prescriptions as of 10/09/2015  Medication Sig  . cyanocobalamin (,VITAMIN B-12,) 1000 MCG/ML injection Inject 1 mL (1,000 mcg total) into the muscle every 30  (thirty) days.  Marland Kitchen estrogen-methylTESTOSTERone (ESTRATEST) 1.25-2.5 MG per tablet Take 1 tablet by mouth daily.    . famotidine (PEPCID) 40 MG tablet Take 1 tablet (40 mg total) by mouth 2 (two) times daily.  . [DISCONTINUED] omeprazole (PRILOSEC) 40 MG capsule Take 1 capsule (40 mg total) by mouth daily.   No facility-administered encounter medications on file as of 10/09/2015.   Allergies  Allergen Reactions  . Codeine Itching and Nausea Only    REACTION: nausea  . Morphine Nausea Only    REACTION: nausea  . Prednisone     REACTION: Hyperactive   Patient Active Problem List   Diagnosis Date Noted  . Unspecified adverse effect of unspecified drug, medicinal and biological substance 05/05/2012  . Back pain with radiation 02/26/2011  . Cervical sprain 02/26/2011  . CONSTIPATION 11/04/2010  . ABDOMINAL PAIN-MULTIPLE SITES 11/04/2010  . ACUTE PHARYNGITIS 05/24/2010  . ABDOMINAL PAIN, EPIGASTRIC 10/31/2009  . GERD 10/02/2009  . HYPERLIPIDEMIA-MIXED 02/14/2009  . UNSPECIFIED VITAMIN D DEFICIENCY 11/23/2008  . ALLERGIC RHINITIS 06/12/2008  . B12 DEFICIENCY 10/07/2007  . ENDOMETRIOSIS OF UTERUS 10/07/2007  . BACK PAIN, LUMBAR 10/07/2007   Social History   Social History  . Marital Status: Married    Spouse Name: N/A  . Number of Children: 2  . Years of Education: N/A   Occupational History  . paralegal    Social History Main Topics  . Smoking status: Never Smoker   . Smokeless tobacco: Never Used  . Alcohol Use: No  . Drug Use: No  . Sexual  Activity: Not on file   Other Topics Concern  . Not on file   Social History Narrative    Ms. Patricia Alvarez's family history includes Breast cancer in her maternal grandmother; Colon polyps in her father; Heart failure (age of onset: 69) in her mother; Hyperlipidemia (age of onset: 70) in her mother. There is no history of Colon cancer.      Objective:    Filed Vitals:   10/09/15 1006  BP: 124/80  Pulse: 78    Physical Exam    well-developed white female in no acute distress, pleasant blood pressure 124/80 pulse 78 height 5 foot 4 weight 144. HEENT ;nontraumatic normal cephalic EOMI PERRLA sclera anicteric, Cardiovascular; regular rate and rhythm with S1-S2 no murmur or gallop, Pulmonary ;clear bilaterally, Abdomen; soft nondistended bowel sounds are active she does have some tenderness in the right mid quadrant no guarding or rebound no palpable mass or hepatosplenomegaly she has had prior tummy tuck, Rectal; exam not done, Extremities; no clubbing cyanosis or edema skin warm and dry, Neuropsych; mood and affect appropriate      Assessment & Alvarez:   #1 54 yo female with Chronic GERD- poorly controlled - on no current therapy and does not want to take PPI #2 Intermittent episodes of mid abdominal  pain and  distention occuring over the past few weeks- etiology not clear- r/o intermittent low grad SBO #3 s/p hysterectomy  #4 s/p abdominoplasty #5 hx adenomatous colon polyps- due for follow up 2020  Alvarez; Start Pepcid 40 mg po BID initially , if better in ome month can decrease to Qam Anti-reflux regimen CBC,CMET Schedule for Ct abd/pelvis with contrast- further Alvarez pending findings  Patricia Alvarez Patricia Harold PA-C 10/09/2015   Cc: Patricia Chessman, DO

## 2015-10-09 NOTE — Patient Instructions (Addendum)
Please go to the basement level to have your labs drawn.  We sent a prescription to [place] for Pepcid 40 mg.    You have been scheduled for a CT scan of the abdomen and pelvis at [facility], first floor. Go to registration inside front door of hospital. You are scheduled on  Friday 10-12-2015 at 7:00 am . You should arrive at 6:45 am  to your appointment time for registration. Please follow the written instructions below on the day of your exam:  WARNING: IF YOU ARE ALLERGIC TO IODINE/X-RAY DYE, PLEASE NOTIFY RADIOLOGY IMMEDIATELY AT [phone]! YOU WILL BE GIVEN A 13 HOUR PREMEDICATION PREP.  1) Do not eat anything after 3:00 AM (4 hours prior to your test) 2) You have been given 2 bottles of oral contrast to drink. The solution may taste  better if refrigerated, but do NOT add ice or any other liquid to this solution. Shake well before drinking.    Drink 1 bottle of contrast @ 5:00 am  (2 hours prior to your exam)  Drink 1 bottle of contrast @ 6:00 am  (1 hour prior to your exam)  You may take any medications as prescribed with a small amount of water except for the following: Metformin, Glucophage, Glucovance, Avandamet, Riomet, Fortamet, Actoplus Met, Janumet, Glumetza or Metaglip. The above medications must be held the day of the exam AND 48 hours after the exam.  The purpose of you drinking the oral contrast is to aid in the visualization of your intestinal tract. The contrast solution may cause some diarrhea. Before your exam is started, you will be given a small amount of fluid to drink. Depending on your individual set of symptoms, you may also receive an intravenous injection of x-ray contrast/dye. Plan on being at [facility] for 30 minutes or long, depending on the type of exam you are having performed.  If you have any questions regarding your exam or if you need to reschedule, you may call the CT department at [phone] between the hours of 8:00  am and 5:00 pm, Monday-Friday.  ________________________________________________________________________

## 2015-10-10 ENCOUNTER — Other Ambulatory Visit: Payer: Self-pay

## 2015-10-10 DIAGNOSIS — R748 Abnormal levels of other serum enzymes: Secondary | ICD-10-CM

## 2015-10-10 DIAGNOSIS — R1031 Right lower quadrant pain: Secondary | ICD-10-CM

## 2015-10-10 DIAGNOSIS — E875 Hyperkalemia: Secondary | ICD-10-CM

## 2015-10-11 ENCOUNTER — Other Ambulatory Visit (INDEPENDENT_AMBULATORY_CARE_PROVIDER_SITE_OTHER): Payer: BLUE CROSS/BLUE SHIELD

## 2015-10-11 DIAGNOSIS — E875 Hyperkalemia: Secondary | ICD-10-CM

## 2015-10-11 DIAGNOSIS — R1031 Right lower quadrant pain: Secondary | ICD-10-CM

## 2015-10-11 DIAGNOSIS — R748 Abnormal levels of other serum enzymes: Secondary | ICD-10-CM

## 2015-10-11 LAB — COMPREHENSIVE METABOLIC PANEL
ALBUMIN: 4.1 g/dL (ref 3.5–5.2)
ALK PHOS: 58 U/L (ref 39–117)
ALT: 7 U/L (ref 0–35)
AST: 16 U/L (ref 0–37)
BILIRUBIN TOTAL: 0.4 mg/dL (ref 0.2–1.2)
BUN: 11 mg/dL (ref 6–23)
CO2: 32 mEq/L (ref 19–32)
Calcium: 9 mg/dL (ref 8.4–10.5)
Chloride: 103 mEq/L (ref 96–112)
Creatinine, Ser: 0.64 mg/dL (ref 0.40–1.20)
GFR: 103.13 mL/min (ref >60.00)
GLUCOSE: 83 mg/dL (ref 70–99)
Potassium: 3.8 mEq/L (ref 3.5–5.1)
Sodium: 139 mEq/L (ref 135–145)
TOTAL PROTEIN: 6.7 g/dL (ref 6.0–8.3)

## 2015-10-11 LAB — LIPASE: LIPASE: 31 U/L (ref 11.0–59.0)

## 2015-10-12 ENCOUNTER — Telehealth: Payer: Self-pay | Admitting: Physician Assistant

## 2015-10-12 ENCOUNTER — Encounter (HOSPITAL_COMMUNITY): Payer: Self-pay

## 2015-10-12 ENCOUNTER — Ambulatory Visit (HOSPITAL_COMMUNITY)
Admission: RE | Admit: 2015-10-12 | Discharge: 2015-10-12 | Disposition: A | Discharge status: Home / Self Care | Payer: BLUE CROSS/BLUE SHIELD | Source: Ambulatory Visit | Attending: Physician Assistant | Admitting: Physician Assistant

## 2015-10-12 DIAGNOSIS — R1084 Generalized abdominal pain: Secondary | ICD-10-CM

## 2015-10-12 DIAGNOSIS — Z9049 Acquired absence of other specified parts of digestive tract: Secondary | ICD-10-CM | POA: Insufficient documentation

## 2015-10-12 DIAGNOSIS — K219 Gastro-esophageal reflux disease without esophagitis: Secondary | ICD-10-CM

## 2015-10-12 DIAGNOSIS — Z90710 Acquired absence of both cervix and uterus: Secondary | ICD-10-CM | POA: Diagnosis not present

## 2015-10-12 DIAGNOSIS — R1031 Right lower quadrant pain: Secondary | ICD-10-CM | POA: Diagnosis present

## 2015-10-12 DIAGNOSIS — R110 Nausea: Secondary | ICD-10-CM | POA: Diagnosis present

## 2015-10-12 MED ORDER — IOHEXOL 300 MG/ML  SOLN
100.0000 mL | Freq: Once | INTRAMUSCULAR | Status: AC | PRN
  Administered 2015-10-12: 100 mL via INTRAVENOUS

## 2015-10-15 NOTE — Telephone Encounter (Signed)
Advised the results are WNL's. Waiting for the provider to review and make her recommendations.

## 2015-10-16 ENCOUNTER — Other Ambulatory Visit: Payer: Self-pay

## 2015-10-25 ENCOUNTER — Telehealth: Payer: Self-pay | Admitting: Physician Assistant

## 2015-10-26 NOTE — Telephone Encounter (Signed)
Patient notified that pancrease and liver were fine on recent CT.  All questions answered.  She will call back for any additional questions or concerns

## 2015-11-06 ENCOUNTER — Ambulatory Visit: Payer: No Typology Code available for payment source | Admitting: Gastroenterology

## 2015-12-05 ENCOUNTER — Encounter (HOSPITAL_COMMUNITY): Payer: Self-pay | Admitting: Family Medicine

## 2015-12-05 ENCOUNTER — Telehealth: Payer: Self-pay | Admitting: Family Medicine

## 2015-12-05 ENCOUNTER — Emergency Department (HOSPITAL_COMMUNITY)
Admission: EM | Admit: 2015-12-05 | Discharge: 2015-12-05 | Discharge status: Against Medical Advice | Payer: BLUE CROSS/BLUE SHIELD | Attending: Emergency Medicine | Admitting: Emergency Medicine

## 2015-12-05 ENCOUNTER — Emergency Department (HOSPITAL_COMMUNITY): Payer: BLUE CROSS/BLUE SHIELD

## 2015-12-05 DIAGNOSIS — R51 Headache: Secondary | ICD-10-CM | POA: Diagnosis not present

## 2015-12-05 DIAGNOSIS — Z79899 Other long term (current) drug therapy: Secondary | ICD-10-CM | POA: Diagnosis not present

## 2015-12-05 DIAGNOSIS — R200 Anesthesia of skin: Secondary | ICD-10-CM | POA: Diagnosis present

## 2015-12-05 DIAGNOSIS — Z86010 Personal history of colonic polyps: Secondary | ICD-10-CM | POA: Insufficient documentation

## 2015-12-05 DIAGNOSIS — Z8719 Personal history of other diseases of the digestive system: Secondary | ICD-10-CM | POA: Insufficient documentation

## 2015-12-05 DIAGNOSIS — H9202 Otalgia, left ear: Secondary | ICD-10-CM | POA: Diagnosis not present

## 2015-12-05 DIAGNOSIS — R202 Paresthesia of skin: Secondary | ICD-10-CM | POA: Diagnosis not present

## 2015-12-05 DIAGNOSIS — Z8742 Personal history of other diseases of the female genital tract: Secondary | ICD-10-CM | POA: Insufficient documentation

## 2015-12-05 DIAGNOSIS — Z8639 Personal history of other endocrine, nutritional and metabolic disease: Secondary | ICD-10-CM | POA: Insufficient documentation

## 2015-12-05 LAB — CBC
HEMATOCRIT: 42.9 % (ref 36.0–46.0)
Hemoglobin: 14.1 g/dL (ref 12.0–15.0)
MCH: 30.8 pg (ref 26.0–34.0)
MCHC: 32.9 g/dL (ref 30.0–36.0)
MCV: 93.7 fL (ref 78.0–100.0)
Platelets: 251 10*3/uL (ref 150–400)
RBC: 4.58 MIL/uL (ref 3.87–5.11)
RDW: 13.2 % (ref 11.5–15.5)
WBC: 3.6 10*3/uL — AB (ref 4.0–10.5)

## 2015-12-05 LAB — URINALYSIS, ROUTINE W REFLEX MICROSCOPIC
Bilirubin Urine: NEGATIVE
GLUCOSE, UA: NEGATIVE mg/dL
HGB URINE DIPSTICK: NEGATIVE
Ketones, ur: NEGATIVE mg/dL
Leukocytes, UA: NEGATIVE
Nitrite: NEGATIVE
PROTEIN: NEGATIVE mg/dL
Specific Gravity, Urine: 1.005 (ref 1.005–1.030)
pH: 7 (ref 5.0–8.0)

## 2015-12-05 LAB — DIFFERENTIAL
Basophils Absolute: 0 10*3/uL (ref 0.0–0.1)
Basophils Relative: 1 %
Eosinophils Absolute: 0.1 10*3/uL (ref 0.0–0.7)
Eosinophils Relative: 3 %
LYMPHS PCT: 40 %
Lymphs Abs: 1.4 10*3/uL (ref 0.7–4.0)
MONO ABS: 0.3 10*3/uL (ref 0.1–1.0)
MONOS PCT: 7 %
NEUTROS ABS: 1.8 10*3/uL (ref 1.7–7.7)
Neutrophils Relative %: 49 %

## 2015-12-05 LAB — RAPID URINE DRUG SCREEN, HOSP PERFORMED
Amphetamines: NOT DETECTED
Barbiturates: NOT DETECTED
Benzodiazepines: NOT DETECTED
Cocaine: NOT DETECTED
Opiates: NOT DETECTED
Tetrahydrocannabinol: NOT DETECTED

## 2015-12-05 LAB — I-STAT CHEM 8, ED
BUN: 9 mg/dL (ref 6–20)
Calcium, Ion: 1.19 mmol/L (ref 1.12–1.23)
Chloride: 104 mmol/L (ref 101–111)
Creatinine, Ser: 0.7 mg/dL (ref 0.44–1.00)
Glucose, Bld: 92 mg/dL (ref 65–99)
HCT: 46 % (ref 36.0–46.0)
Hemoglobin: 15.6 g/dL — ABNORMAL HIGH (ref 12.0–15.0)
Potassium: 4.2 mmol/L (ref 3.5–5.1)
Sodium: 140 mmol/L (ref 135–145)
TCO2: 26 mmol/L (ref 0–100)

## 2015-12-05 LAB — COMPREHENSIVE METABOLIC PANEL WITH GFR
ALT: 9 U/L — ABNORMAL LOW (ref 14–54)
AST: 21 U/L (ref 15–41)
Albumin: 3.8 g/dL (ref 3.5–5.0)
Alkaline Phosphatase: 53 U/L (ref 38–126)
Anion gap: 10 (ref 5–15)
BUN: 9 mg/dL (ref 6–20)
CO2: 25 mmol/L (ref 22–32)
Calcium: 9.4 mg/dL (ref 8.9–10.3)
Chloride: 105 mmol/L (ref 101–111)
Creatinine, Ser: 0.61 mg/dL (ref 0.44–1.00)
GFR calc Af Amer: >60 mL/min
GFR calc non Af Amer: >60 mL/min
Glucose, Bld: 96 mg/dL (ref 65–99)
Potassium: 4.2 mmol/L (ref 3.5–5.1)
Sodium: 140 mmol/L (ref 135–145)
Total Bilirubin: 0.8 mg/dL (ref 0.3–1.2)
Total Protein: 6.4 g/dL — ABNORMAL LOW (ref 6.5–8.1)

## 2015-12-05 LAB — CBG MONITORING, ED: Glucose-Capillary: 81 mg/dL (ref 65–99)

## 2015-12-05 LAB — I-STAT TROPONIN, ED: Troponin i, poc: 0 ng/mL (ref 0.00–0.08)

## 2015-12-05 LAB — PROTIME-INR
INR: 0.98 (ref 0.00–1.49)
Prothrombin Time: 13.2 s (ref 11.6–15.2)

## 2015-12-05 LAB — APTT: aPTT: 31 s (ref 24–37)

## 2015-12-05 NOTE — ED Provider Notes (Signed)
CSN: [id number]     Arrival date & time 12/05/15  [id number] History   First MD Initiated Contact with Patient 12/05/15 [phone]     Chief Complaint  Patient presents with  . Numbness  . Headache      HPI Pt was seen at 0920. Per pt, c/o gradual onset and persistence of constant left sided face "numbness" that started at 2100 last night. Pt states she began to have a left earache, which was followed by the left side of her face becoming "numb" as well as the tip of her tongue. States her left arm also "felt numb" and "cold." Pt states the numbness persists today. Denies CP/SOB, no abd pain, no N/V/D, no headache, no fevers, no neck pain, no visual changes, no focal motor weakness, no new tingling/numbness in extremities, no ataxia, no slurred speech, no facial droop.    Past Medical History  Diagnosis Date  . Hyperlipidemia   . Vitamin D deficiency   . Allergic rhinitis   . Back pain   . Endometriosis of uterus   . B12 deficiency   . Internal hemorrhoid   . Colon polyp 2015    TUBULAR ADENOMA (X 1).  . IBS (irritable bowel syndrome)    Past Surgical History  Procedure Laterality Date  . Appendectomy  02/2000    and ovary  . Partial hysterectomy  1994    right ovary and uterus  . Laparoscopy    . Colonoscopy w/ biopsies  2015    multiple  . Esophagogastroduodenoscopy  2011    multiple  . Oophorectomy Left 02/2000    w/appendectomy   Family History  Problem Relation Age of Onset  . Hyperlipidemia Mother 20  . Heart failure Mother 5  . Colon polyps Father   . Colon cancer Neg Hx   . Breast cancer Maternal Grandmother    Social History  Substance Use Topics  . Smoking status: Never Smoker   . Smokeless tobacco: Never Used  . Alcohol Use: No    Review of Systems ROS: Statement: All systems negative except as marked or noted in the HPI; Constitutional: Negative for fever and chills. ; ; Eyes: Negative for eye pain, redness and discharge. ; ; ENMT: Negative for ear pain,  hoarseness, nasal congestion, sinus pressure and sore throat. ; ; Cardiovascular: Negative for chest pain, palpitations, diaphoresis, dyspnea and peripheral edema. ; ; Respiratory: Negative for cough, wheezing and stridor. ; ; Gastrointestinal: Negative for nausea, vomiting, diarrhea, abdominal pain, blood in stool, hematemesis, jaundice and rectal bleeding. . ; ; Genitourinary: Negative for dysuria, flank pain and hematuria. ; ; Musculoskeletal: Negative for back pain and neck pain. Negative for swelling and trauma.; ; Skin: Negative for pruritus, rash, abrasions, blisters, bruising and skin lesion.; ; Neuro: +paresthesias. Negative for headache, lightheadedness and neck stiffness. Negative for weakness, altered level of consciousness , altered mental status, extremity weakness, nvoluntary movement, seizure and syncope.      Allergies  Codeine; Morphine; and Prednisone  Home Medications   Prior to Admission medications   Medication Sig Start Date End Date Taking? Authorizing Provider  cyanocobalamin (,VITAMIN B-12,) 1000 MCG/ML injection Inject 1 mL (1,000 mcg total) into the muscle every 30 (thirty) days. 09/05/14   Rosalita Chessman, DO  estrogen-methylTESTOSTERone (ESTRATEST) 1.25-2.5 MG per tablet Take 1 tablet by mouth daily.      Historical Provider, MD  famotidine (PEPCID) 40 MG tablet Take 1 tablet (40 mg total) by mouth 2 (two) times daily. 10/09/15  Amy S Esterwood, PA-C   BP 103/72 mmHg  Pulse 70  Temp(Src) 98 F (36.7 C) (Oral)  Resp 16  SpO2 99% Physical Exam  0925: Physical examination:  Nursing notes reviewed; Vital signs and O2 SAT reviewed;  Constitutional: Well developed, Well nourished, Well hydrated, In no acute distress; Head:  Normocephalic, atraumatic; Eyes: EOMI, PERRL, No scleral icterus; ENMT: Mouth and pharynx normal, Mucous membranes moist; Neck: Supple, Full range of motion, No lymphadenopathy; Cardiovascular: Regular rate and rhythm, No murmur, rub, or gallop;  Respiratory: Breath sounds clear & equal bilaterally, No rales, rhonchi, wheezes.  Speaking full sentences with ease, Normal respiratory effort/excursion; Chest: Nontender, Movement normal; Abdomen: Soft, Nontender, Nondistended, Normal bowel sounds; Genitourinary: No CVA tenderness; Spine:  No midline CS, TS, LS tenderness.;; Extremities: Pulses normal, No tenderness, No edema, No calf edema or asymmetry.; Neuro: AA&Ox3, Major CN grossly intact. Speech clear.  No facial droop.  No nystagmus. Grips equal. Strength 5/5 equal bilat UE's and LE's.  DTR 2/4 equal bilat UE's and LE's.  +left lower face subjective decreased sensation, otherwise gross sensory deficits.  Normal cerebellar testing bilat UE's (finger-nose) and LE's (heel-shin)..; Skin: Color normal, Warm, Dry.   ED Course  Procedures (including critical care time) Labs Review  Imaging Review  I have personally reviewed and evaluated these images and lab results as part of my medical decision-making.   EKG Interpretation   Date/Time:  Wednesday December 05 2015 08:51:17 EST Ventricular Rate:  71 PR Interval:  132 QRS Duration: 88 QT Interval:  384 QTC Calculation: 417 R Axis:   77 Text Interpretation:  Normal sinus rhythm Normal ECG Confirmed by [facility]   MD, Nunzio Cory [phone]) on 12/05/2015 10:39:54 AM      MDM  MDM Reviewed: previous chart, nursing note and vitals Reviewed previous: labs Interpretation: labs, x-ray, ECG and MRI      Results for orders placed or performed during the hospital encounter of 12/05/15  Protime-INR  Result Value Ref Range   Prothrombin Time 13.2 11.6 - 15.2 seconds   INR 0.98 0.00 - 1.49  APTT  Result Value Ref Range   aPTT 31 24 - 37 seconds  CBC  Result Value Ref Range   WBC 3.6 (L) 4.0 - 10.5 K/uL   RBC 4.58 3.87 - 5.11 MIL/uL   Hemoglobin 14.1 12.0 - 15.0 g/dL   HCT 42.9 36.0 - 46.0 %   MCV 93.7 78.0 - 100.0 fL   MCH 30.8 26.0 - 34.0 pg   MCHC 32.9 30.0 - 36.0 g/dL   RDW 13.2 11.5  - 15.5 %   Platelets 251 150 - 400 K/uL  Differential  Result Value Ref Range   Neutrophils Relative % 49 %   Neutro Abs 1.8 1.7 - 7.7 K/uL   Lymphocytes Relative 40 %   Lymphs Abs 1.4 0.7 - 4.0 K/uL   Monocytes Relative 7 %   Monocytes Absolute 0.3 0.1 - 1.0 K/uL   Eosinophils Relative 3 %   Eosinophils Absolute 0.1 0.0 - 0.7 K/uL   Basophils Relative 1 %   Basophils Absolute 0.0 0.0 - 0.1 K/uL  Comprehensive metabolic panel  Result Value Ref Range   Sodium 140 135 - 145 mmol/L   Potassium 4.2 3.5 - 5.1 mmol/L   Chloride 105 101 - 111 mmol/L   CO2 25 22 - 32 mmol/L   Glucose, Bld 96 65 - 99 mg/dL   BUN 9 6 - 20 mg/dL   Creatinine, Ser 0.61 0.44 -  1.00 mg/dL   Calcium 9.4 8.9 - 10.3 mg/dL   Total Protein 6.4 (L) 6.5 - 8.1 g/dL   Albumin 3.8 3.5 - 5.0 g/dL   AST 21 15 - 41 U/L   ALT 9 (L) 14 - 54 U/L   Alkaline Phosphatase 53 38 - 126 U/L   Total Bilirubin 0.8 0.3 - 1.2 mg/dL   GFR calc non Af Amer >60 >60 mL/min   GFR calc Af Amer >60 >60 mL/min   Anion gap 10 5 - 15  Urinalysis, Routine w reflex microscopic  Result Value Ref Range   Color, Urine YELLOW YELLOW   APPearance CLEAR CLEAR   Specific Gravity, Urine 1.005 1.005 - 1.030   pH 7.0 5.0 - 8.0   Glucose, UA NEGATIVE NEGATIVE mg/dL   Hgb urine dipstick NEGATIVE NEGATIVE   Bilirubin Urine NEGATIVE NEGATIVE   Ketones, ur NEGATIVE NEGATIVE mg/dL   Protein, ur NEGATIVE NEGATIVE mg/dL   Nitrite NEGATIVE NEGATIVE   Leukocytes, UA NEGATIVE NEGATIVE  Urine rapid drug screen (hosp performed)  Result Value Ref Range   Opiates NONE DETECTED NONE DETECTED   Cocaine NONE DETECTED NONE DETECTED   Benzodiazepines NONE DETECTED NONE DETECTED   Amphetamines NONE DETECTED NONE DETECTED   Tetrahydrocannabinol NONE DETECTED NONE DETECTED   Barbiturates NONE DETECTED NONE DETECTED  I-stat troponin, ED (not at [facility], [facility])  Result Value Ref Range   Troponin i, poc 0.00 0.00 - 0.08 ng/mL   Comment 3          CBG monitoring, ED   Result Value Ref Range   Glucose-Capillary 81 65 - 99 mg/dL  I-Stat Chem 8, ED  (not at [facility], [facility])  Result Value Ref Range   Sodium 140 135 - 145 mmol/L   Potassium 4.2 3.5 - 5.1 mmol/L   Chloride 104 101 - 111 mmol/L   BUN 9 6 - 20 mg/dL   Creatinine, Ser 0.70 0.44 - 1.00 mg/dL   Glucose, Bld 92 65 - 99 mg/dL   Calcium, Ion 1.19 1.12 - 1.23 mmol/L   TCO2 26 0 - 100 mmol/L   Hemoglobin 15.6 (H) 12.0 - 15.0 g/dL   HCT 46.0 36.0 - 46.0 %   Mr Brain Wo Contrast (neuro Protocol) 12/05/2015  CLINICAL DATA:  Numbness of the left side of the face beginning 14 hours ago. Some numbness of the left arm is well. EXAM: MRI HEAD WITHOUT CONTRAST TECHNIQUE: Multiplanar, multiecho pulse sequences of the brain and surrounding structures were obtained without intravenous contrast. COMPARISON:  None FINDINGS: Diffusion imaging does not show any acute or subacute infarction. The brainstem and cerebellum are normal. The left cerebral hemisphere is normal. The right cerebral hemispheres normal except for a small focus of abnormal T2 and FLAIR signal in the subcortical white matter of the right posterior frontal lobe. 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 No evidence of swelling. No hemorrhage, mass lesion, hydrocephalus or extra-axial collection. No pituitary mass. No inflammatory sinus disease. No skull or skullbase lesion. Major vessels at base of the brain show flow. IMPRESSION: No acute finding. 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 I would not expect this to relate to the acute symptoms. Electronically Signed   By: Nelson Chimes M.D.   On: 12/05/2015 11:32    1150: No change in assessment.  T/C to Neuro Dr. Janann Colonel, case  discussed, including:  HPI, pertinent PM/SHx, VS/PE, dx testing, ED course and treatment:  Requests to check MRI CS; if negative also, pt can be d/c  with outpt f/u. Pt updated; agreeable with plan.   1340:   Pt states she does not want to wait for the MRI, and wants to go home now. I encouraged pt to stay, continues to refuse.  Pt makes her own medical decisions.  Risks of AMA explained to pt, including, but not limited to:  stroke, cervical spine lesion(s), heart attack, cardiac arrythmia ("irregular heart rate/beat"), "passing out," temporary and/or permanent disability, death.  Pt and family verb understanding and continue to refuse admission, understanding the consequences of their decision.  I encouraged pt to follow up with her PMD tomorrow and Neuro MD within the next week, and return to the ED immediately if symptoms worsen, she changes her mind, or for any other concerns.  Pt verb understanding, agreeable.   Francine Graven, DO 12/09/15 2112

## 2015-12-05 NOTE — ED Notes (Signed)
EKG completed in triage.

## 2015-12-05 NOTE — Telephone Encounter (Signed)
Patient Name: Patricia Alvarez  DOB: [birth year]    Initial Comment caller states she had a pain in her left ear and left side of her face is numb   Nurse Assessment  Nurse: Leilani Merl, RN, Nira Conn Date/Time (Eastern Time): 12/05/2015 8:16:16 AM  Confirm and document reason for call. If symptomatic, describe symptoms. You must click the next button to save text entered. ---caller states she has the feeling of congestion in her left ear and left side of her face is numb. This started last night about 9 pm, no facial drooping  Has the patient traveled out of the country within the last 30 days? ---Not Applicable  Does the patient have any new or worsening symptoms? ---Yes  Will a triage be completed? ---Yes  Related visit to physician within the last 2 weeks? ---No  Does the PT have any chronic conditions? (i.e. diabetes, asthma, etc.) ---Yes  List chronic conditions. ---see MR  Did the patient indicate they were pregnant? ---No  Is this a behavioral health or substance abuse call? ---No     Guidelines    Guideline Title Affirmed Question Affirmed Notes  Neurologic Deficit Patient sounds very sick or weak to the triager    Final Disposition User   Go to ED Now (or PCP triage) Standifer, RN, Heather    Comments  Caller states that the areas on the left side of her face are not numb, she is able to feel them.   Referrals  GO TO FACILITY UNDECIDED   Disagree/Comply: Comply

## 2015-12-05 NOTE — Discharge Instructions (Signed)
°Emergency Department Resource Guide °1) Find a Doctor and Pay Out of Pocket °Although you won't have to find out who is covered by your insurance plan, it is a good idea to ask around and get recommendations. You will then need to call the office and see if the doctor you have chosen will accept you as a new patient and what types of options they offer for patients who are self-pay. Some doctors offer discounts or will set up payment plans for their patients who do not have insurance, but you will need to ask so you aren't surprised when you get to your appointment. ° °2) Contact Your Local Health Department °Not all health departments have doctors that can see patients for sick visits, but many do, so it is worth a call to see if yours does. If you don't know where your local health department is, you can check in your phone book. The CDC also has a tool to help you locate your state's health department, and many state websites also have listings of all of their local health departments. ° °3) Find a Walk-in Clinic °If your illness is not likely to be very severe or complicated, you may want to try a walk in clinic. These are popping up all over the country in pharmacies, drugstores, and shopping centers. They're usually staffed by nurse practitioners or physician assistants that have been trained to treat common illnesses and complaints. They're usually fairly quick and inexpensive. However, if you have serious medical issues or chronic medical problems, these are probably not your best option. ° °No Primary Care Doctor: °- Call [facility] at  832-8000 - they can help you locate a primary care doctor that  accepts your insurance, provides certain services, etc. °- Physician Referral Service- 1-800-533-3463 ° °Chronic Pain Problems: °Organization         Address  Phone   Notes  °[facility]  (336) 297-2271 Patients need to be referred by their primary care doctor.  ° °Medication  Assistance: °Organization         Address  Phone   Notes  °[place] [facility] [address] °[place], [place] [address] (336) 641-8030 --Must be a resident of [place] °-- Must have NO insurance coverage whatsoever (no Medicaid/ Medicare, etc.) °-- The pt. MUST have a primary care doctor that directs their care regularly and follows them in the community °  °[facility]  (866) 331-1348   °[employer]  (888) 892-1162   ° °Agencies that provide inexpensive medical care: °Organization         Address  Phone   Notes  °[facility]  (336) 832-8035   °[facility]    (336) 832-7272   °[facility] [address] °[place], [place] [address] (336) 832-4777   °[facility] [address], °[place] (336) 271-4999   °[facility]    (336) 373-0678   °[facility]    (336) 272-1050   °[facility] ° [address], [place] Phone:  (336) 832-4444, Fax:  (336) 832-4440 Hours of Operation:  9 am - 6 pm, M-F.  Also accepts Medicaid/Medicare and self-pay.  ° [facility] ° [address], [place] Phone: (336) 832-3150, Fax: (336) 832-3151. Hours of Operation:  8:30 am - 5:30 pm, M-F.  Also accepts Medicaid and self-pay.  °[facility] [place] [address], [place] Phone: (336) 878-6027   °[facility] [address], [place], [place] (336)723-1848, Ext. 123 Mondays & Thursdays: 7-9 AM.  First 15 patients are seen on a first come, first serve basis. °  ° °Medicaid-accepting [place] Providers: ° °Organization         Address  Phone   Notes  °[facility] [address], [place] (336) 641-2100 Also accepts self-pay patients.  °[facility] [address], [place] ° (336) 856-9996   °[facility] [address], [place]  (336) 288-8857   °[facility] [address] [place] [address], [place] (336) 299-7000   °Veita Bland [address], [place]  ° (336) 373-1557 Only accepts [place] Access Medicaid patients after they have their name applied to their card.  ° °Self-Pay (no insurance) in [place]: ° °Organization         Address  Phone   Notes  °Sickle Cell Patients, [facility] [address], [place] (336) 832-1970   °[facility] [address], [place] (336) 832-4400   °[facility] ° [address], [place] (336) 992-4800   °[facility]/Dr. Osei-Bonsu ° [address] [place] [address], [place] or [address], [place] (336) [phone] Phone number for both [place] and [place] locations is the same.  °[facility] [address], [place] (336) 299-0000   °[facility] [address] [place] [address], [place] or [address] (336) 852-7530 °(336) 878-2260   °[facility] [address], [place] (336) 350-1642, phone; (336) 294-5005, fax Sees patients 1st and 3rd Saturday of every month.  Must not qualify for public or private insurance (i.e. Medicaid, Medicare, [place] Health Choice, Veterans' Benefits) • Household income should be no more than 200% of the poverty level •The clinic cannot treat you if you are pregnant or think you are pregnant • Sexually transmitted diseases are not treated at the clinic.  ° ° °Dental Care: °Organization         Address  Phone  Notes  °[place] [facility] [address], [place] (336) 641-6152 Accepts children up to age 21 who are enrolled in Medicaid or [place] Health Choice; pregnant women with a Medicaid card; and children who have applied for Medicaid or [place] Health Choice, but were declined, whose parents can pay a reduced fee at time of service.  °[place]  [facility] [place]  [address], [place] (336) 641-7733 Accepts children up to age 21 who are enrolled in Medicaid or [place] Health Choice; pregnant women with a Medicaid card; and children who have applied for Medicaid or  Health Choice, but were declined, whose parents can pay a reduced fee at time of service.  °[facility] ° [address],  (336) 641-4533 Patients are seen by appointment only. Walk-ins are not accepted. [facility] will see patients 18 years of age and older. °Monday - Tuesday (8am-5pm) °Most Wednesdays (8:30-5pm) °$30 per visit, cash only  °[facility] ° [address], [place] (336) 641-4533 Patients are seen by appointment only. Walk-ins are not accepted. [facility] will see patients 18 years of age and older. °One   Wednesday Evening (Monthly: Volunteer Based).  $30 per visit, cash only  °[facility]  (919) 537-3737 for adults; Children under age 4, call Graduate Pediatric Dentistry at ([phone]) [phone]. Children aged 4-14, please call (919) [phone] to request a pediatric application. ° Dental services are provided in all areas of dental care including fillings, crowns and bridges, complete and partial dentures, implants, gum treatment, root canals, and extractions. Preventive care is also provided. Treatment is provided to both adults and children. °Patients are selected via a lottery and there is often a waiting list. °  °[facility] [address], °[place] ° (336) 763-8833 [email or web address] °  °[facility] [address], [place], [place] (336)723-1848, Ext. 123 Second and Fourth Thursday of each month, opens at 6:30 AM; Clinic ends at 9 AM.  Patients are seen on a first-come first-served basis, and a limited number are seen during each clinic.  ° °[facility] ° [address], [place], [place] (336) 723-7904    Eligibility Requirements °You must have lived in [place], or [place] counties for at least the last three months. °  You cannot be eligible for state or federal sponsored healthcare insurance, including Veterans Administration, Medicaid, or Medicare. °  You generally cannot be eligible for healthcare insurance through your employer.  °  How to apply: °Eligibility screenings are held every Tuesday and Wednesday afternoon from 1:00 pm until 4:00 pm. You do not need an appointment for the interview!  °[facility] [address], [place], [place] 336-631-2330   °[facility]  336-342-8273   °[facility]  336-703-3100   °[facility]  336-570-6415   ° °Behavioral Health Resources in the Community: °Intensive Outpatient Programs °Organization         Address  Phone  Notes  °[facility] [address], [place], [place] 336-878-6098   °[facility] [address], [place], [place] 336-832-9800   °[facility] [address], [place], [place] ° 336-882-2125   °[facility] [address],  °[place], [place] 1-800-853-5163 or 336-641-4981   °Substance Abuse Resources °Organization         Address  Phone  Notes  °[facility]  336-882-2125   °[facility]  336-784-9470   °[facility]  336-285-9073   °[facility]  336-845-3988   °Residential & Outpatient Substance Abuse Program  1-800-659-3381   °Psychological Services °Organization         Address  Phone  Notes  °[facility]  336- 832-9600   °[facility]  336- 378-7881   °[facility] [address], [place] 1-800-853-5163 or 336-641-4981   ° °Mobile Crisis Teams °Organization         Address  Phone  Notes  °[facility], Mobile Crisis Care Unit  1-877-626-1772   °Assertive °[facility] ° [address]  [place], [place] 336-834-9664   °Sharon DeEsch [address] °[place] 336-554-5454   ° °Self-Help/Support Groups °Organization         Address  Phone             Notes  °[facility] - variety of support groups  336- 373-1402 Call for more information  °Narcotics Anonymous (NA), [facility] [address], °[place]  2 meetings at this location  ° °  Residential Treatment Programs Organization         Address  Phone  Notes  [facility] [address]    [place]  1-814-016-8334   [facility]  [address], [place] [id number], [place], [place]   [place], [place] [phone] Admissions: 8am-3pm M-F  [facility] [place] [address]    [place], [place] [phone]   [facility] [address] [place], [place], [place]   [facility] [address],  [place], [place]   [facility] Intensive Outpatient [address], [place], [place]   [facility] ([place]  [place], [place] 1-228-127-9876 or 346-101-2246   [facility] [address] [place], [place] Accepts Medicaid  [facility] [address]  [place] 1-(479) 391-1952 Substance Abuse/Addiction Treatment   [facility] Organization         Address  Phone  Notes  [facility]  267-554-5803   Domenic Schwab, PhD [address] Arlis [place], [place]   2282377159 or 223-289-0462   [facility] [place], [place] 905-367-1932   [facility] [address], [place], [place] 618-632-0709 Insurance/Medicaid/sponsorship through [facility] [address] [place]                                    [place], [place] (954) [phone] [place] [address], [place] 418-680-0010    Dr. Adele Schilder  325-431-8263   [facility] Dept. 1) [address], [place] 2) [place] 3)  [address], [place] 602-876-4184 [phone]  [phone]   [place] 641-089-8110 or 351-764-6466 (After Hours)      Take your usual prescriptions as previously directed.  Call your regular medical doctor today to schedule a follow up appointment within the next 1 to 2 days. Call the Neurologist today to schedule an appointment within the next week.  Return to the Emergency Department immediately sooner if worsening.

## 2015-12-05 NOTE — ED Notes (Signed)
Patient still in MRI.  

## 2015-12-05 NOTE — Telephone Encounter (Signed)
Per pt's chart, she went to the ER.

## 2015-12-05 NOTE — ED Notes (Signed)
Dr.mcmanus made aware pt requesting to leave. MRI states it will be 1 hr before next MRI can be done.

## 2015-12-05 NOTE — ED Notes (Signed)
Patient states also had numbness tip of tongue continued today.

## 2015-12-05 NOTE — ED Notes (Signed)
Pt here for numbness left side of face and tongue that started about 9 pm last night. sts some numbness went into left arm and arm feels cold. sts pain in left ear and into head.

## 2015-12-28 ENCOUNTER — Ambulatory Visit: Payer: BLUE CROSS/BLUE SHIELD | Admitting: Neurology

## 2016-04-09 ENCOUNTER — Other Ambulatory Visit: Payer: Self-pay | Admitting: Obstetrics and Gynecology

## 2016-04-09 DIAGNOSIS — Z1231 Encounter for screening mammogram for malignant neoplasm of breast: Secondary | ICD-10-CM

## 2016-04-17 DIAGNOSIS — Z6824 Body mass index (BMI) 24.0-24.9, adult: Secondary | ICD-10-CM | POA: Diagnosis not present

## 2016-04-17 DIAGNOSIS — Z01419 Encounter for gynecological examination (general) (routine) without abnormal findings: Secondary | ICD-10-CM | POA: Diagnosis not present

## 2016-04-17 LAB — HM PAP SMEAR: HM Pap smear: NORMAL

## 2016-05-12 ENCOUNTER — Ambulatory Visit
Admission: RE | Admit: 2016-05-12 | Discharge: 2016-05-12 | Disposition: A | Discharge status: Home / Self Care | Payer: BLUE CROSS/BLUE SHIELD | Source: Ambulatory Visit | Attending: Obstetrics and Gynecology | Admitting: Obstetrics and Gynecology

## 2016-05-12 DIAGNOSIS — Z1231 Encounter for screening mammogram for malignant neoplasm of breast: Secondary | ICD-10-CM

## 2016-05-15 DIAGNOSIS — M9901 Segmental and somatic dysfunction of cervical region: Secondary | ICD-10-CM | POA: Diagnosis not present

## 2016-05-15 DIAGNOSIS — M47812 Spondylosis without myelopathy or radiculopathy, cervical region: Secondary | ICD-10-CM | POA: Diagnosis not present

## 2016-05-15 DIAGNOSIS — R51 Headache: Secondary | ICD-10-CM | POA: Diagnosis not present

## 2016-05-15 DIAGNOSIS — M5022 Other cervical disc displacement, mid-cervical region, unspecified level: Secondary | ICD-10-CM | POA: Diagnosis not present

## 2016-05-20 DIAGNOSIS — M5022 Other cervical disc displacement, mid-cervical region, unspecified level: Secondary | ICD-10-CM | POA: Diagnosis not present

## 2016-05-20 DIAGNOSIS — M9901 Segmental and somatic dysfunction of cervical region: Secondary | ICD-10-CM | POA: Diagnosis not present

## 2016-05-20 DIAGNOSIS — M47812 Spondylosis without myelopathy or radiculopathy, cervical region: Secondary | ICD-10-CM | POA: Diagnosis not present

## 2016-05-20 DIAGNOSIS — R51 Headache: Secondary | ICD-10-CM | POA: Diagnosis not present

## 2016-08-20 ENCOUNTER — Other Ambulatory Visit: Payer: Self-pay | Admitting: Physician Assistant

## 2016-08-22 DIAGNOSIS — L821 Other seborrheic keratosis: Secondary | ICD-10-CM | POA: Diagnosis not present

## 2016-08-22 DIAGNOSIS — L918 Other hypertrophic disorders of the skin: Secondary | ICD-10-CM | POA: Diagnosis not present

## 2016-08-22 DIAGNOSIS — D2262 Melanocytic nevi of left upper limb, including shoulder: Secondary | ICD-10-CM | POA: Diagnosis not present

## 2016-08-22 DIAGNOSIS — D2261 Melanocytic nevi of right upper limb, including shoulder: Secondary | ICD-10-CM | POA: Diagnosis not present

## 2016-09-15 DIAGNOSIS — H04123 Dry eye syndrome of bilateral lacrimal glands: Secondary | ICD-10-CM | POA: Diagnosis not present

## 2016-11-03 DIAGNOSIS — M5137 Other intervertebral disc degeneration, lumbosacral region: Secondary | ICD-10-CM | POA: Diagnosis not present

## 2016-11-03 DIAGNOSIS — M9905 Segmental and somatic dysfunction of pelvic region: Secondary | ICD-10-CM | POA: Diagnosis not present

## 2016-11-03 DIAGNOSIS — M9903 Segmental and somatic dysfunction of lumbar region: Secondary | ICD-10-CM | POA: Diagnosis not present

## 2016-11-03 DIAGNOSIS — M5441 Lumbago with sciatica, right side: Secondary | ICD-10-CM | POA: Diagnosis not present

## 2016-11-06 DIAGNOSIS — M5137 Other intervertebral disc degeneration, lumbosacral region: Secondary | ICD-10-CM | POA: Diagnosis not present

## 2016-11-06 DIAGNOSIS — M9903 Segmental and somatic dysfunction of lumbar region: Secondary | ICD-10-CM | POA: Diagnosis not present

## 2016-11-06 DIAGNOSIS — M9905 Segmental and somatic dysfunction of pelvic region: Secondary | ICD-10-CM | POA: Diagnosis not present

## 2016-11-06 DIAGNOSIS — M5441 Lumbago with sciatica, right side: Secondary | ICD-10-CM | POA: Diagnosis not present

## 2016-11-11 DIAGNOSIS — M5441 Lumbago with sciatica, right side: Secondary | ICD-10-CM | POA: Diagnosis not present

## 2016-11-11 DIAGNOSIS — M5137 Other intervertebral disc degeneration, lumbosacral region: Secondary | ICD-10-CM | POA: Diagnosis not present

## 2016-11-11 DIAGNOSIS — M9905 Segmental and somatic dysfunction of pelvic region: Secondary | ICD-10-CM | POA: Diagnosis not present

## 2016-11-11 DIAGNOSIS — M9903 Segmental and somatic dysfunction of lumbar region: Secondary | ICD-10-CM | POA: Diagnosis not present

## 2016-12-11 DIAGNOSIS — D225 Melanocytic nevi of trunk: Secondary | ICD-10-CM | POA: Diagnosis not present

## 2016-12-11 DIAGNOSIS — D485 Neoplasm of uncertain behavior of skin: Secondary | ICD-10-CM | POA: Diagnosis not present

## 2016-12-15 ENCOUNTER — Telehealth: Payer: Self-pay | Admitting: Gastroenterology

## 2016-12-15 ENCOUNTER — Ambulatory Visit: Payer: BLUE CROSS/BLUE SHIELD | Admitting: Gastroenterology

## 2016-12-15 NOTE — Telephone Encounter (Signed)
Do you want to charge? 

## 2016-12-15 NOTE — Telephone Encounter (Signed)
No charge this time. 

## 2017-01-12 ENCOUNTER — Ambulatory Visit (INDEPENDENT_AMBULATORY_CARE_PROVIDER_SITE_OTHER): Payer: BLUE CROSS/BLUE SHIELD | Admitting: Gastroenterology

## 2017-01-12 ENCOUNTER — Telehealth: Payer: Self-pay | Admitting: Gastroenterology

## 2017-01-12 ENCOUNTER — Encounter: Payer: Self-pay | Admitting: Gastroenterology

## 2017-01-12 VITALS — BP 90/62 | HR 70 | Resp 16 | Ht 64.5 in | Wt 144.0 lb

## 2017-01-12 DIAGNOSIS — K219 Gastro-esophageal reflux disease without esophagitis: Secondary | ICD-10-CM

## 2017-01-12 DIAGNOSIS — K5901 Slow transit constipation: Secondary | ICD-10-CM | POA: Diagnosis not present

## 2017-01-12 DIAGNOSIS — Z86010 Personal history of colonic polyps: Secondary | ICD-10-CM | POA: Diagnosis not present

## 2017-01-12 DIAGNOSIS — K648 Other hemorrhoids: Secondary | ICD-10-CM

## 2017-01-12 MED ORDER — OMEPRAZOLE 40 MG PO CPDR: 40.0000 mg | DELAYED_RELEASE_CAPSULE | Freq: Two times a day (BID) | ORAL | 11 refills | Status: DC

## 2017-01-12 MED ORDER — PLECANATIDE 3 MG PO TABS: 1.0000 | ORAL_TABLET | Freq: Every day | ORAL | 11 refills | Status: DC

## 2017-01-12 NOTE — Telephone Encounter (Signed)
Left a message for patient to return my call. 

## 2017-01-12 NOTE — Telephone Encounter (Signed)
Patient states the prescription is over 300 dollars. Asked patient if she has tried the coupon that I gave her. Patient states she has not yet but will try it today and call me back to let me know how much it costs.

## 2017-01-12 NOTE — Progress Notes (Signed)
    History of Present Illness: This is a 56 year old female with GERD. Symptoms have improved on Pepcid 40 mg twice daily however she has breakthrough symptoms times daily and intermittent hiccups. She has ongoing difficulties with constipation and occasionally notes small volume rectal bleeding with bowel movements.  Current Medications, Allergies, Past Medical History, Past Surgical History, Family History and Social History were reviewed in [employer] record.  Physical Exam: General: Well developed, well nourished, no acute distress Head: Normocephalic and atraumatic Eyes:  sclerae anicteric, EOMI Ears: Normal auditory acuity Mouth: No deformity or lesions Lungs: Clear throughout to auscultation Heart: Regular rate and rhythm; no murmurs, rubs or bruits Abdomen: Soft, non tender and non distended. No masses, hepatosplenomegaly or hernias noted. Normal Bowel sounds Musculoskeletal: Symmetrical with no gross deformities  Pulses:  Normal pulses noted Extremities: No clubbing, cyanosis, edema or deformities noted Neurological: Alert oriented x 4, grossly nonfocal Psychological:  Alert and cooperative. Normal mood and affect  Assessment and Recommendations:  1. GERD symptoms not adequately controlled. Change to omeprazole 40 mg twice daily. Discontinue Pepcid. Return office visit in 6 weeks..  2. Chronic constipation. Previous trial of Linzess led to cramping and diarrhea and she could not tolerate it. Begin Trileptal 3 mg daily.  3. Small volume hematochezia likely secondary to small internal hemorrhoids. Preparation H suppositories daily as needed. Improved management of constipation as above.  4. Personal history of adenomatous colon polyps. Five-year interval surveillance colonoscopy is recommended in January 2020.

## 2017-01-12 NOTE — Patient Instructions (Signed)
We have given you Trulance samples to start taking one tablet by mouth daily.   We have sent the following medications to your pharmacy for you to pick up at your convenience:omeprazole and Trulance.    Thank you for choosing me and [facility].  Pricilla Riffle. Dagoberto Ligas., MD., [place]

## 2017-01-13 DIAGNOSIS — M5441 Lumbago with sciatica, right side: Secondary | ICD-10-CM | POA: Diagnosis not present

## 2017-01-13 DIAGNOSIS — M5137 Other intervertebral disc degeneration, lumbosacral region: Secondary | ICD-10-CM | POA: Diagnosis not present

## 2017-01-13 DIAGNOSIS — M9905 Segmental and somatic dysfunction of pelvic region: Secondary | ICD-10-CM | POA: Diagnosis not present

## 2017-01-13 DIAGNOSIS — M9903 Segmental and somatic dysfunction of lumbar region: Secondary | ICD-10-CM | POA: Diagnosis not present

## 2017-01-20 DIAGNOSIS — M5441 Lumbago with sciatica, right side: Secondary | ICD-10-CM | POA: Diagnosis not present

## 2017-01-20 DIAGNOSIS — M5137 Other intervertebral disc degeneration, lumbosacral region: Secondary | ICD-10-CM | POA: Diagnosis not present

## 2017-01-20 DIAGNOSIS — M9905 Segmental and somatic dysfunction of pelvic region: Secondary | ICD-10-CM | POA: Diagnosis not present

## 2017-01-20 DIAGNOSIS — M9903 Segmental and somatic dysfunction of lumbar region: Secondary | ICD-10-CM | POA: Diagnosis not present

## 2017-01-28 ENCOUNTER — Telehealth: Payer: Self-pay | Admitting: Gastroenterology

## 2017-01-28 MED ORDER — LINACLOTIDE 145 MCG PO CAPS: 145.0000 ug | ORAL_CAPSULE | Freq: Every day | ORAL | 11 refills | Status: DC

## 2017-01-28 NOTE — Telephone Encounter (Signed)
Patient states the Trulance samples worked for the first several days but now she has not had a BM since Sunday. Patient states she would really like to start Linzess since she knows it is covered by her insurance and she is feeling miser bale today. Informed patient that Dr. Fuller Plan wants her to start with Linzess 145 mcg and she can call us and let us know in a couple of weeks how she is doing. Patient verbalized understanding.

## 2017-01-28 NOTE — Telephone Encounter (Signed)
If Trulance is working please give her a discount card and stay on Trulance. If not available then Linzess 145 mcg daily.

## 2017-01-28 NOTE — Telephone Encounter (Signed)
Patient states Trulance is not covered by her insurance but Linzess is covered. Can we switch patient to [place]?

## 2017-01-29 DIAGNOSIS — S0501XA Injury of conjunctiva and corneal abrasion without foreign body, right eye, initial encounter: Secondary | ICD-10-CM | POA: Diagnosis not present

## 2017-02-05 DIAGNOSIS — M5441 Lumbago with sciatica, right side: Secondary | ICD-10-CM | POA: Diagnosis not present

## 2017-02-05 DIAGNOSIS — M9905 Segmental and somatic dysfunction of pelvic region: Secondary | ICD-10-CM | POA: Diagnosis not present

## 2017-02-05 DIAGNOSIS — M9903 Segmental and somatic dysfunction of lumbar region: Secondary | ICD-10-CM | POA: Diagnosis not present

## 2017-02-05 DIAGNOSIS — M5137 Other intervertebral disc degeneration, lumbosacral region: Secondary | ICD-10-CM | POA: Diagnosis not present

## 2017-02-25 ENCOUNTER — Ambulatory Visit: Payer: BLUE CROSS/BLUE SHIELD | Admitting: Gastroenterology

## 2017-04-02 DIAGNOSIS — L738 Other specified follicular disorders: Secondary | ICD-10-CM | POA: Diagnosis not present

## 2017-04-02 DIAGNOSIS — D485 Neoplasm of uncertain behavior of skin: Secondary | ICD-10-CM | POA: Diagnosis not present

## 2017-04-02 DIAGNOSIS — L739 Follicular disorder, unspecified: Secondary | ICD-10-CM | POA: Diagnosis not present

## 2017-04-02 DIAGNOSIS — D225 Melanocytic nevi of trunk: Secondary | ICD-10-CM | POA: Diagnosis not present

## 2017-04-14 ENCOUNTER — Other Ambulatory Visit: Payer: Self-pay | Admitting: Obstetrics and Gynecology

## 2017-04-14 DIAGNOSIS — Z1231 Encounter for screening mammogram for malignant neoplasm of breast: Secondary | ICD-10-CM

## 2017-04-23 DIAGNOSIS — Z01419 Encounter for gynecological examination (general) (routine) without abnormal findings: Secondary | ICD-10-CM | POA: Diagnosis not present

## 2017-04-23 DIAGNOSIS — Z6824 Body mass index (BMI) 24.0-24.9, adult: Secondary | ICD-10-CM | POA: Diagnosis not present

## 2017-05-18 ENCOUNTER — Ambulatory Visit
Admission: RE | Admit: 2017-05-18 | Discharge: 2017-05-18 | Disposition: A | Discharge status: Home / Self Care | Payer: BLUE CROSS/BLUE SHIELD | Source: Ambulatory Visit | Attending: Obstetrics and Gynecology | Admitting: Obstetrics and Gynecology

## 2017-05-18 DIAGNOSIS — Z1231 Encounter for screening mammogram for malignant neoplasm of breast: Secondary | ICD-10-CM | POA: Diagnosis not present

## 2017-07-21 DIAGNOSIS — M9903 Segmental and somatic dysfunction of lumbar region: Secondary | ICD-10-CM | POA: Diagnosis not present

## 2017-07-21 DIAGNOSIS — M9905 Segmental and somatic dysfunction of pelvic region: Secondary | ICD-10-CM | POA: Diagnosis not present

## 2017-07-21 DIAGNOSIS — M5441 Lumbago with sciatica, right side: Secondary | ICD-10-CM | POA: Diagnosis not present

## 2017-07-21 DIAGNOSIS — M5137 Other intervertebral disc degeneration, lumbosacral region: Secondary | ICD-10-CM | POA: Diagnosis not present

## 2017-07-27 DIAGNOSIS — M9905 Segmental and somatic dysfunction of pelvic region: Secondary | ICD-10-CM | POA: Diagnosis not present

## 2017-07-27 DIAGNOSIS — M9903 Segmental and somatic dysfunction of lumbar region: Secondary | ICD-10-CM | POA: Diagnosis not present

## 2017-07-27 DIAGNOSIS — M5137 Other intervertebral disc degeneration, lumbosacral region: Secondary | ICD-10-CM | POA: Diagnosis not present

## 2017-07-27 DIAGNOSIS — M5441 Lumbago with sciatica, right side: Secondary | ICD-10-CM | POA: Diagnosis not present

## 2017-08-04 DIAGNOSIS — M9905 Segmental and somatic dysfunction of pelvic region: Secondary | ICD-10-CM | POA: Diagnosis not present

## 2017-08-04 DIAGNOSIS — M5137 Other intervertebral disc degeneration, lumbosacral region: Secondary | ICD-10-CM | POA: Diagnosis not present

## 2017-08-04 DIAGNOSIS — M5441 Lumbago with sciatica, right side: Secondary | ICD-10-CM | POA: Diagnosis not present

## 2017-08-04 DIAGNOSIS — M9903 Segmental and somatic dysfunction of lumbar region: Secondary | ICD-10-CM | POA: Diagnosis not present

## 2017-08-10 DIAGNOSIS — M9903 Segmental and somatic dysfunction of lumbar region: Secondary | ICD-10-CM | POA: Diagnosis not present

## 2017-08-10 DIAGNOSIS — M5441 Lumbago with sciatica, right side: Secondary | ICD-10-CM | POA: Diagnosis not present

## 2017-08-10 DIAGNOSIS — M5137 Other intervertebral disc degeneration, lumbosacral region: Secondary | ICD-10-CM | POA: Diagnosis not present

## 2017-08-10 DIAGNOSIS — M9905 Segmental and somatic dysfunction of pelvic region: Secondary | ICD-10-CM | POA: Diagnosis not present

## 2017-08-20 DIAGNOSIS — N951 Menopausal and female climacteric states: Secondary | ICD-10-CM | POA: Diagnosis not present

## 2017-08-24 DIAGNOSIS — M9905 Segmental and somatic dysfunction of pelvic region: Secondary | ICD-10-CM | POA: Diagnosis not present

## 2017-08-24 DIAGNOSIS — M5441 Lumbago with sciatica, right side: Secondary | ICD-10-CM | POA: Diagnosis not present

## 2017-08-24 DIAGNOSIS — M5137 Other intervertebral disc degeneration, lumbosacral region: Secondary | ICD-10-CM | POA: Diagnosis not present

## 2017-08-24 DIAGNOSIS — M9903 Segmental and somatic dysfunction of lumbar region: Secondary | ICD-10-CM | POA: Diagnosis not present

## 2017-09-07 DIAGNOSIS — M5441 Lumbago with sciatica, right side: Secondary | ICD-10-CM | POA: Diagnosis not present

## 2017-09-07 DIAGNOSIS — M9903 Segmental and somatic dysfunction of lumbar region: Secondary | ICD-10-CM | POA: Diagnosis not present

## 2017-09-07 DIAGNOSIS — M5137 Other intervertebral disc degeneration, lumbosacral region: Secondary | ICD-10-CM | POA: Diagnosis not present

## 2017-09-07 DIAGNOSIS — M9905 Segmental and somatic dysfunction of pelvic region: Secondary | ICD-10-CM | POA: Diagnosis not present

## 2017-09-21 DIAGNOSIS — M5441 Lumbago with sciatica, right side: Secondary | ICD-10-CM | POA: Diagnosis not present

## 2017-09-21 DIAGNOSIS — M5137 Other intervertebral disc degeneration, lumbosacral region: Secondary | ICD-10-CM | POA: Diagnosis not present

## 2017-09-21 DIAGNOSIS — M9903 Segmental and somatic dysfunction of lumbar region: Secondary | ICD-10-CM | POA: Diagnosis not present

## 2017-09-21 DIAGNOSIS — M9905 Segmental and somatic dysfunction of pelvic region: Secondary | ICD-10-CM | POA: Diagnosis not present

## 2017-09-28 DIAGNOSIS — M4727 Other spondylosis with radiculopathy, lumbosacral region: Secondary | ICD-10-CM | POA: Diagnosis not present

## 2017-09-28 DIAGNOSIS — M5117 Intervertebral disc disorders with radiculopathy, lumbosacral region: Secondary | ICD-10-CM | POA: Diagnosis not present

## 2017-09-28 DIAGNOSIS — M5116 Intervertebral disc disorders with radiculopathy, lumbar region: Secondary | ICD-10-CM | POA: Diagnosis not present

## 2017-10-09 DIAGNOSIS — D2272 Melanocytic nevi of left lower limb, including hip: Secondary | ICD-10-CM | POA: Diagnosis not present

## 2017-10-09 DIAGNOSIS — D2261 Melanocytic nevi of right upper limb, including shoulder: Secondary | ICD-10-CM | POA: Diagnosis not present

## 2017-10-09 DIAGNOSIS — D225 Melanocytic nevi of trunk: Secondary | ICD-10-CM | POA: Diagnosis not present

## 2017-10-09 DIAGNOSIS — D216 Benign neoplasm of connective and other soft tissue of trunk, unspecified: Secondary | ICD-10-CM | POA: Diagnosis not present

## 2017-10-09 DIAGNOSIS — M6740 Ganglion, unspecified site: Secondary | ICD-10-CM | POA: Diagnosis not present

## 2017-10-09 DIAGNOSIS — D2262 Melanocytic nevi of left upper limb, including shoulder: Secondary | ICD-10-CM | POA: Diagnosis not present

## 2017-10-09 DIAGNOSIS — D485 Neoplasm of uncertain behavior of skin: Secondary | ICD-10-CM | POA: Diagnosis not present

## 2017-11-24 DIAGNOSIS — H04123 Dry eye syndrome of bilateral lacrimal glands: Secondary | ICD-10-CM | POA: Diagnosis not present

## 2017-12-01 DIAGNOSIS — M5440 Lumbago with sciatica, unspecified side: Secondary | ICD-10-CM | POA: Diagnosis not present

## 2017-12-01 DIAGNOSIS — M5416 Radiculopathy, lumbar region: Secondary | ICD-10-CM | POA: Diagnosis not present

## 2017-12-01 DIAGNOSIS — M5136 Other intervertebral disc degeneration, lumbar region: Secondary | ICD-10-CM | POA: Diagnosis not present

## 2017-12-01 DIAGNOSIS — M47816 Spondylosis without myelopathy or radiculopathy, lumbar region: Secondary | ICD-10-CM | POA: Diagnosis not present

## 2017-12-01 DIAGNOSIS — M549 Dorsalgia, unspecified: Secondary | ICD-10-CM | POA: Diagnosis not present

## 2018-01-12 DIAGNOSIS — M47816 Spondylosis without myelopathy or radiculopathy, lumbar region: Secondary | ICD-10-CM | POA: Diagnosis not present

## 2018-01-12 DIAGNOSIS — M5416 Radiculopathy, lumbar region: Secondary | ICD-10-CM | POA: Diagnosis not present

## 2018-01-12 DIAGNOSIS — M5440 Lumbago with sciatica, unspecified side: Secondary | ICD-10-CM | POA: Diagnosis not present

## 2018-01-12 DIAGNOSIS — M5136 Other intervertebral disc degeneration, lumbar region: Secondary | ICD-10-CM | POA: Diagnosis not present

## 2018-02-17 ENCOUNTER — Encounter: Payer: Self-pay | Admitting: Family Medicine

## 2018-02-17 ENCOUNTER — Ambulatory Visit: Payer: BLUE CROSS/BLUE SHIELD | Admitting: Family Medicine

## 2018-02-17 ENCOUNTER — Ambulatory Visit (INDEPENDENT_AMBULATORY_CARE_PROVIDER_SITE_OTHER)
Admission: RE | Admit: 2018-02-17 | Discharge: 2018-02-17 | Disposition: A | Discharge status: Home / Self Care | Payer: BLUE CROSS/BLUE SHIELD | Source: Ambulatory Visit | Attending: Family Medicine | Admitting: Family Medicine

## 2018-02-17 VITALS — BP 91/62 | HR 68 | Temp 98.1°F | Resp 18 | Ht 64.5 in | Wt 141.0 lb

## 2018-02-17 DIAGNOSIS — R201 Hypoesthesia of skin: Secondary | ICD-10-CM

## 2018-02-17 DIAGNOSIS — R519 Headache, unspecified: Secondary | ICD-10-CM

## 2018-02-17 DIAGNOSIS — M5412 Radiculopathy, cervical region: Secondary | ICD-10-CM | POA: Diagnosis not present

## 2018-02-17 DIAGNOSIS — M4802 Spinal stenosis, cervical region: Secondary | ICD-10-CM | POA: Insufficient documentation

## 2018-02-17 DIAGNOSIS — E559 Vitamin D deficiency, unspecified: Secondary | ICD-10-CM | POA: Diagnosis not present

## 2018-02-17 DIAGNOSIS — K219 Gastro-esophageal reflux disease without esophagitis: Secondary | ICD-10-CM

## 2018-02-17 DIAGNOSIS — G43809 Other migraine, not intractable, without status migrainosus: Secondary | ICD-10-CM

## 2018-02-17 DIAGNOSIS — Z0000 Encounter for general adult medical examination without abnormal findings: Secondary | ICD-10-CM

## 2018-02-17 DIAGNOSIS — M47812 Spondylosis without myelopathy or radiculopathy, cervical region: Secondary | ICD-10-CM | POA: Diagnosis not present

## 2018-02-17 DIAGNOSIS — E538 Deficiency of other specified B group vitamins: Secondary | ICD-10-CM

## 2018-02-17 DIAGNOSIS — G8929 Other chronic pain: Secondary | ICD-10-CM

## 2018-02-17 DIAGNOSIS — Z01118 Encounter for examination of ears and hearing with other abnormal findings: Secondary | ICD-10-CM | POA: Diagnosis not present

## 2018-02-17 DIAGNOSIS — M792 Neuralgia and neuritis, unspecified: Secondary | ICD-10-CM

## 2018-02-17 DIAGNOSIS — M5030 Other cervical disc degeneration, unspecified cervical region: Secondary | ICD-10-CM | POA: Insufficient documentation

## 2018-02-17 DIAGNOSIS — Z79899 Other long term (current) drug therapy: Secondary | ICD-10-CM

## 2018-02-17 DIAGNOSIS — R94128 Abnormal results of other function studies of ear and other special senses: Secondary | ICD-10-CM

## 2018-02-17 DIAGNOSIS — G43909 Migraine, unspecified, not intractable, without status migrainosus: Secondary | ICD-10-CM | POA: Insufficient documentation

## 2018-02-17 DIAGNOSIS — R51 Headache: Secondary | ICD-10-CM

## 2018-02-17 DIAGNOSIS — M4722 Other spondylosis with radiculopathy, cervical region: Secondary | ICD-10-CM | POA: Insufficient documentation

## 2018-02-17 LAB — CBC WITH DIFFERENTIAL/PLATELET
BASOS ABS: 0.1 10*3/uL (ref 0.0–0.1)
Basophils Relative: 1.7 % (ref 0.0–3.0)
Eosinophils Absolute: 0.1 10*3/uL (ref 0.0–0.7)
Eosinophils Relative: 3.5 % (ref 0.0–5.0)
HEMATOCRIT: 40.2 % (ref 36.0–46.0)
Hemoglobin: 13.5 g/dL (ref 12.0–15.0)
LYMPHS PCT: 36.3 % (ref 12.0–46.0)
Lymphs Abs: 1.3 10*3/uL (ref 0.7–4.0)
MCHC: 33.5 g/dL (ref 30.0–36.0)
MCV: 94.6 fl (ref 78.0–100.0)
MONOS PCT: 5.9 % (ref 3.0–12.0)
Monocytes Absolute: 0.2 10*3/uL (ref 0.1–1.0)
NEUTROS PCT: 52.6 % (ref 43.0–77.0)
Neutro Abs: 1.9 10*3/uL (ref 1.4–7.7)
Platelets: 262 10*3/uL (ref 150.0–400.0)
RBC: 4.25 Mil/uL (ref 3.87–5.11)
RDW: 13.9 % (ref 11.5–15.5)
WBC: 3.5 10*3/uL — ABNORMAL LOW (ref 4.0–10.5)

## 2018-02-17 LAB — COMPREHENSIVE METABOLIC PANEL
ALBUMIN: 4 g/dL (ref 3.5–5.2)
ALT: 12 U/L (ref 0–35)
AST: 21 U/L (ref 0–37)
Alkaline Phosphatase: 57 U/L (ref 39–117)
BILIRUBIN TOTAL: 0.4 mg/dL (ref 0.2–1.2)
BUN: 11 mg/dL (ref 6–23)
CALCIUM: 9 mg/dL (ref 8.4–10.5)
CO2: 31 mEq/L (ref 19–32)
CREATININE: 0.55 mg/dL (ref 0.40–1.20)
Chloride: 105 mEq/L (ref 96–112)
GFR: 121.77 mL/min (ref >60.00)
Glucose, Bld: 91 mg/dL (ref 70–99)
Potassium: 4.4 mEq/L (ref 3.5–5.1)
Sodium: 139 mEq/L (ref 135–145)
Total Protein: 6.3 g/dL (ref 6.0–8.3)

## 2018-02-17 LAB — TSH: TSH: 2.4 u[IU]/mL (ref 0.35–4.50)

## 2018-02-17 LAB — VITAMIN B12: VITAMIN B 12: 111 pg/mL — AB (ref 211–911)

## 2018-02-17 LAB — VITAMIN D 25 HYDROXY (VIT D DEFICIENCY, FRACTURES): VITD: 17.59 ng/mL — ABNORMAL LOW (ref 30.00–100.00)

## 2018-02-17 LAB — MAGNESIUM: Magnesium: 1.9 mg/dL (ref 1.5–2.5)
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
## 2018-02-17 NOTE — Patient Instructions (Addendum)
I will call with labs and xray results once available.  We will discuss further work up after results received.   It was a pleasure to meet you today!  Please help [place] help you:  We are honored you have chosen [facility] for your Primary [facility] home. Below you will find basic instructions that you may need to access in the future. Please help [place] help you by reading the instructions, which cover many of the frequent questions we experience.   Prescription refills and request:  -In order to allow more efficient response time, please call your pharmacy for all refills. They will forward the request electronically to [place]. This allows for the quickest possible response. Request left on a nurse line can take longer to refill, since these are checked as time allows between office patients and other phone calls.  - refill request can take up to 3-5 working days to complete.  - If request is sent electronically and request is appropiate, it is usually completed in 1-2 business days.  - all patients will need to be seen routinely for all chronic medical conditions requiring prescription medications (see follow-up below). If you are overdue for follow up on your condition, you will be asked to make an appointment and we will call in enough medication to cover you until your appointment (up to 30 days).  - all controlled substances will require a face to face visit to request/refill.  - if you desire your prescriptions to go through a new pharmacy, and have an active script at original pharmacy, you will need to call your pharmacy and have scripts transferred to new pharmacy. This is completed between the pharmacy locations and not by your provider.    Results: If any images or labs were ordered, it can take up to 1 week to get results depending on the test ordered and the lab/facility running and resulting the test. - Normal or stable results, which do not need further discussion, may be released to your  mychart immediately with attached note to you. A call may not be generated for normal results. Please make certain to sign up for mychart. If you have questions on how to activate your mychart you can call the front office.  - If your results need further discussion, our office will attempt to contact you via phone, and if unable to reach you after 2 attempts, we will release your abnormal result to your mychart with instructions.  - All results will be automatically released in mychart after 1 week.  - Your provider will provide you with explanation and instruction on all relevant material in your results. Please keep in mind, results and labs may appear confusing or abnormal to the untrained eye, but it does not mean they are actually abnormal for you personally. If you have any questions about your results that are not covered, or you desire more detailed explanation than what was provided, you should make an appointment with your provider to do so.   Our office handles many outgoing and incoming calls daily. If we have not contacted you within 1 week about your results, please check your mychart to see if there is a message first and if not, then contact our office.  In helping with this matter, you help decrease call volume, and therefore allow [place] to be able to respond to patients needs more efficiently.   Acute office visits (sick visit):  An acute visit is intended for a new problem and  are scheduled in shorter time slots to allow schedule openings for patients with new problems. This is the appropriate visit to discuss a new problem. In order to provide you with excellent quality medical care with proper time for you to explain your problem, have an exam and receive treatment with instructions, these appointments should be limited to one new problem per visit. If you experience a new problem, in which you desire to be addressed, please make an acute office visit, we save openings on the schedule to  accommodate you. Please do not save your new problem for any other type of visit, let us take care of it properly and quickly for you.   Follow up visits:  Depending on your condition(s) your provider will need to see you routinely in order to provide you with quality care and prescribe medication(s). Most chronic conditions (Example: hypertension, Diabetes, depression/anxiety... etc), require visits a couple times a year. Your provider will instruct you on proper follow up for your personal medical conditions and history. Please make certain to make follow up appointments for your condition as instructed. Failing to do so could result in lapse in your medication treatment/refills. If you request a refill, and are overdue to be seen on a condition, we will always provide you with a 30 day script (once) to allow you time to schedule.    Medicare wellness (well visit): - we have a wonderful Nurse Maudie Mercury), that will meet with you and provide you will yearly medicare wellness visits. These visits should occur yearly (can not be scheduled less than 1 calendar year apart) and cover preventive health, immunizations, advance directives and screenings you are entitled to yearly through your medicare benefits. Do not miss out on your entitled benefits, this is when medicare will pay for these benefits to be ordered for you.  These are strongly encouraged by your provider and is the appropriate type of visit to make certain you are up to date with all preventive health benefits. If you have not had your medicare wellness exam in the last 12 months, please make certain to schedule one by calling the office and schedule your medicare wellness with Maudie Mercury as soon as possible.   Yearly physical (well visit):  - Adults are recommended to be seen yearly for physicals. Check with your insurance and date of your last physical, most insurances require one calendar year between physicals. Physicals include all preventive health  topics, screenings, medical exam and labs that are appropriate for gender/age and history. You may have fasting labs needed at this visit. This is a well visit (not a sick visit), new problems should not be covered during this visit (see acute visit).  - Pediatric patients are seen more frequently when they are younger. Your provider will advise you on well child visit timing that is appropriate for your their age. - This is not a medicare wellness visit. Medicare wellness exams do not have an exam portion to the visit. Some medicare companies allow for a physical, some do not allow a yearly physical. If your medicare allows a yearly physical you can schedule the medicare wellness with our nurse Maudie Mercury and have your physical with your provider after, on the same day. Please check with insurance for your full benefits.   Late Policy/No Shows:  - all new patients should arrive 15-30 minutes earlier than appointment to allow [place] time  to  obtain all personal demographics,  insurance information and for you to complete office paperwork. -  All established patients should arrive 10-15 minutes earlier than appointment time to update all information and be checked in .  - In our best efforts to run on time, if you are late for your appointment you will be asked to either reschedule or if able, we will work you back into the schedule. There will be a wait time to work you back in the schedule,  depending on availability.  - If you are unable to make it to your appointment as scheduled, please call 24 hours ahead of time to allow [place] to fill the time slot with someone else who needs to be seen. If you do not cancel your appointment ahead of time, you may be charged a no show fee.  Occipital Neuralgia Occipital neuralgia is a type of headache that causes episodes of very bad pain in the back of your head. Pain from occipital neuralgia may spread (radiate) to other parts of your head. The pain is usually brief and often  goes away after you rest and relax. These headaches may be caused by irritation of the nerves that leave your spinal cord high up in your neck, just below the base of your skull (occipital nerves). Your occipital nerves transmit sensations from the back of your head, the top of your head, and the areas behind your ears. What are the causes? Occipital neuralgia can occur without any known cause (primary headache syndrome). In other cases, occipital neuralgia is caused by pressure on or irritation of one of the two occipital nerves. Causes of occipital nerve compression or irritation include:  Wear and tear of the vertebrae in the neck (osteoarthritis).  Neck injury.  Disease of the disks that separate the vertebrae.  Tumors.  Gout.  Infections.  Diabetes.  Swollen blood vessels that put pressure on the occipital nerves.  Muscle spasm in the neck.  What are the signs or symptoms? Pain is the main symptom of occipital neuralgia. It usually starts in the back of the head but may also be felt in other areas supplied by the occipital nerves. Pain is usually on one side but may be on both sides. You may have:  Brief episodes of very bad pain that is burning, stabbing, shocking, or shooting.  Pain behind the eye.  Pain triggered by neck movement or hair brushing.  Scalp tenderness.  Aching in the back of the head between episodes of very bad pain.  How is this diagnosed? Your health care provider may diagnose occipital neuralgia based on your symptoms and a physical exam. During the exam, the health care provider may push on areas supplied by the occipital nerves to see if they are painful. Some tests may also be done to help in making the diagnosis. These may include:  Imaging studies of the upper spinal cord, such as an MRI or CT scan. These may show compression or spinal cord abnormalities.  Nerve block. You will get an injection of numbing medicine (local anesthetic) near the  occipital nerve to see if this relieves pain.  How is this treated? Treatment may begin with simple measures, such as:  Rest.  Massage.  Heat.  Over-the-counter pain relievers.  If these measures do not work, you may need other treatments, including:  Medicines such as: ? Prescription-strength anti-inflammatory medicines. ? Muscle relaxants. ? Antiseizure medicines. ? Antidepressants.  Steroid injection. This involves injections of local anesthetic and strong anti-inflammatory drugs (steroids).  Pulsed radiofrequency. Wires are implanted to deliver electrical impulses that block pain  signals from the occipital nerve.  Physical therapy.  Surgery to relieve nerve pressure.  Follow these instructions at home:  Take all medicines as directed by your health care provider.  Avoid activities that cause pain.  Rest when you have an attack of pain.  Try gentle massage or a heating pad to relieve pain.  Work with a physical therapist to learn stretching exercises you can do at home.  Try a different pillow or sleeping position.  Practice good posture.  Try to stay active. Get regular exercise that does not cause pain. Ask your health care provider to suggest safe exercises for you.  Keep all follow-up visits as directed by your health care provider. This is important. Contact a health care provider if:  Your medicine is not working.  You have new or worsening symptoms. Get help right away if:  You have very bad head pain that is not going away.  You have a sudden change in vision, balance, or speech. This information is not intended to replace advice given to you by your health care provider. Make sure you discuss any questions you have with your health care provider. Document Released: 10/07/2001 Document Revised: 03/20/2016 Document Reviewed: 10/05/2013 Elsevier Interactive Patient Education  2017 [employer].   Trigeminal Neuralgia Trigeminal neuralgia is a  nerve disorder that causes attacks of severe facial pain. The attacks last from a few seconds to several minutes. They can happen for days, weeks, or months and then go away for months or years. Trigeminal neuralgia is also called tic douloureux. What are the causes? This condition is caused by damage to a nerve in the face that is called the trigeminal nerve. An attack can be triggered by:  Talking.  Chewing.  Putting on makeup.  Washing your face.  Shaving your face.  Brushing your teeth.  Touching your face.  What increases the risk? This condition is more likely to develop in:  Women.  People who are 27 years of age or older.  What are the signs or symptoms? The main symptom of this condition is pain in the jaw, lips, eyes, nose, scalp, forehead, and face. The pain may be intense, stabbing, electric, or shock-like. How is this diagnosed? This condition is diagnosed with a physical exam. A CT scan or MRI may be done to rule out other conditions that can cause facial pain. How is this treated? This condition may be treated with:  Avoiding the things that trigger your attacks.  Pain medicine.  Surgery. This may be done in severe cases if other medical treatment does not provide relief.  Follow these instructions at home:  Take over-the-counter and prescription medicines only as told by your health care provider.  If you wish to get pregnant, talk with your health care provider before you start trying to get pregnant.  Avoid the things that trigger your attacks. It may help to: ? Chew on the unaffected side of your mouth. ? Avoid touching your face. ? Avoid blasts of hot or cold air. Contact a health care provider if:  Your pain medicine is not helping.  You develop new, unexplained symptoms, such as: ? Double vision. ? Facial weakness. ? Changes in hearing or balance.  You become pregnant. Get help right away if:  Your pain is unbearable, and your pain  medicine does not help. This information is not intended to replace advice given to you by your health care provider. Make sure you discuss any questions you have with  your health care provider. Document Released: 10/10/2000 Document Revised: 06/15/2016 Document Reviewed: 02/05/2015 Elsevier Interactive Patient Education  2018 [employer].   Multiple Sclerosis Multiple sclerosis (MS) is a disease of the central nervous system. It leads to the loss of the insulating covering of the nerves (myelin sheath) of your brain. When this happens, brain signals do not get sent properly or may not get sent at all. The age of onset of MS varies. What are the causes? The cause of MS is unknown. However, it is more common in the [place] than in the [place]. What increases the risk? There is a higher number of women with MS than men. MS is not an illness that is passed down to you from your family members (inherited). However, your risk of MS is higher if you have a relative with MS. What are the signs or symptoms? The symptoms of MS occur in episodes or attacks. These attacks may last weeks to months. There may be long periods of almost no symptoms between attacks. The symptoms of MS vary. This is because of the many different ways it affects the central nervous system. The main symptoms of MS include:  Vision problems and eye pain.  Numbness.  Weakness.  Inability to move your arms, hands, feet, or legs (paralysis).  Balance problems.  Tremors.  How is this diagnosed? Your health care provider can diagnose MS with the help of imaging exams and lab tests. These may include specialized X-ray exams and spinal fluid tests. The best imaging exam to confirm a diagnosis of MS is an MRI. How is this treated? There is no known cure for MS, but there are medicines that can decrease the number and frequency of attacks. Steroids are often used for short-term relief. Physical and  occupational therapy may also help. There are also many new alternative or complementary treatments available to help control the symptoms of MS. Ask your health care provider if any of these other options are right for you. Follow these instructions at home:  Take medicines as directed by your health care provider.  Exercise as directed by your health care provider. Contact a health care provider if: You begin to feel depressed. Get help right away if:  You develop paralysis.  You have problems with bladder, bowel, or sexual function.  You develop mental changes, such as forgetfulness or mood swings.  You have a period of uncontrolled movements (seizure). This information is not intended to replace advice given to you by your health care provider. Make sure you discuss any questions you have with your health care provider. Document Released: 10/10/2000 Document Revised: 03/20/2016 Document Reviewed: 06/20/2013 Elsevier Interactive Patient Education  2017 [employer].

## 2018-02-18 ENCOUNTER — Telehealth: Payer: Self-pay | Admitting: Family Medicine

## 2018-02-18 DIAGNOSIS — M5030 Other cervical disc degeneration, unspecified cervical region: Secondary | ICD-10-CM

## 2018-02-18 DIAGNOSIS — M4802 Spinal stenosis, cervical region: Secondary | ICD-10-CM

## 2018-02-18 DIAGNOSIS — R201 Hypoesthesia of skin: Secondary | ICD-10-CM

## 2018-02-18 DIAGNOSIS — M792 Neuralgia and neuritis, unspecified: Secondary | ICD-10-CM

## 2018-02-18 DIAGNOSIS — M4722 Other spondylosis with radiculopathy, cervical region: Secondary | ICD-10-CM

## 2018-02-18 MED ORDER — VITAMIN D (ERGOCALCIFEROL) 1.25 MG (50000 UNIT) PO CAPS: 50000.0000 [IU] | ORAL_CAPSULE | ORAL | 0 refills | Status: DC

## 2018-02-18 NOTE — Telephone Encounter (Signed)
Spoke with patient reviewed lab results,xray results and instructions. Patient verbalized understanding. Scheduled patient B12 injection and 3 month follow up appt.

## 2018-02-18 NOTE — Telephone Encounter (Signed)
Please inform patient the following information:  1. Her B12 is severely low. She needs replacement. She had a h/o of requiring B12 injections in the past. I would like her to restart these injections once a week for 4 doses, then every 2 weeks for 4 doses. Injections to be given here by nurse visit.  Provider appt in 3 months (please schedule now) to follow up on level.  2. Her vit d is severely low. I have called in a supplement for her to start once a week. I also want her to start 1000u daily oral OTC with a meal during and to continue after prescribed supplement. Will follow also at 3 month follow up.   3. Her magnesium is lower end normal. Start a daily magnesium supplement OTC (usually 400 mg)  4. 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 She will need an MRI of her neck and I have started the process on ordering that for her. This may go straight through or take a few days to get scheduled depending upon her insurance. They will call to schedule her. AS SOON AS SHE GETS AN APPT FOR MRI, she is to call here to make an appt with me 2 days later to review all of her results and discuss further plan.

## 2018-02-19 ENCOUNTER — Telehealth: Payer: Self-pay | Admitting: Family Medicine

## 2018-02-19 ENCOUNTER — Telehealth: Payer: Self-pay | Admitting: *Deleted

## 2018-02-19 DIAGNOSIS — M792 Neuralgia and neuritis, unspecified: Secondary | ICD-10-CM

## 2018-02-19 DIAGNOSIS — M5030 Other cervical disc degeneration, unspecified cervical region: Secondary | ICD-10-CM

## 2018-02-19 NOTE — Telephone Encounter (Signed)
Pt has a MRI of her CERVICAL SPINE. I do not understand the phone note sent requesting a spine MRI with neck MRI from pt. - MRI of the rest of her spine is not indicated at this time. Insurance would not pay for the rest of her spine if that is what she is referring to.

## 2018-02-19 NOTE — Telephone Encounter (Signed)
Copied from [place] [id number]. Topic: Quick Communication - See Telephone Encounter >> Feb 19, 2018  2:39 PM Boyd Kerbs wrote: CRM for notification.   Fraser Din from [facility] [phone] needs to have referral stating cervicale with contrast and w/o contrast       or  With out only .  Can not do just do with contrast (she does not have previous one)   See Telephone encounter for: 02/19/18.

## 2018-02-19 NOTE — Telephone Encounter (Signed)
Copied from [place] [id number]. Topic: Referral - Request >> Feb 18, 2018  2:00 PM Hewitt Shorts wrote: CRM for notification. See Telephone encounter for: 02/18/18. Pt is wanting to add to her mri of the neck also wants to do an mri of her spine at the same time   Best number [phone]   Left message with information on patient voice mail.

## 2018-02-22 ENCOUNTER — Ambulatory Visit: Payer: Self-pay

## 2018-02-22 ENCOUNTER — Other Ambulatory Visit: Payer: Self-pay | Admitting: Obstetrics and Gynecology

## 2018-02-22 DIAGNOSIS — Z1231 Encounter for screening mammogram for malignant neoplasm of breast: Secondary | ICD-10-CM

## 2018-02-22 NOTE — Telephone Encounter (Signed)
Pt. c/o numbness in lips, tongue, and left side of roof of mouth, that spread to left jaw, and to left orbital area; episode lasted 3 minutes, with residual numbness in the tongue and jaw.  Has had intermittent tingling in left arm; stated this did occur after the onset of the facial / mouth numbness today.  Denied any headache dizziness, vision loss, balance issues, or speech problems.  Reported she had some blurring of vision of left eye with the numbness; this has cleared now.  Has MRI scheduled on 02/26/18.  Spoke with nurse in office.  Recommended to try to move MRI up to earlier date, but if symptoms are worse, should go to the ER for evaluation today.   Pt. Advised of recommendation per Dr. Raoul Pitch.  Stated she will call [facility] to attempt to move MRI to earlier date.  Strongly advised to go to the ER with worsening or persistent symptoms.  Verb. Understanding.  Agrees with plan.       Reason for Disposition . [1] Numbness (i.e., loss of sensation) of the face, arm / hand, or leg / foot on one side of the body AND [2] sudden onset AND [3] brief (now gone)    Called PCP office.  Recommended if symptoms worsen or persist, to go to ER; otherwise should change MRI to earlier date.  Answer Assessment - Initial Assessment Questions 1. SYMPTOM: "What is the main symptom you are concerned about?" (e.g., weakness, numbness)     Numbness started in roof of mouth, tongue, moved into left jaw, left eye area, lasted 3 minutes; was still noticeable in mouth, but not as bad.  2. ONSET: "When did this start?" (minutes, hours, days; while sleeping)     About 3:47 PM 3. LAST NORMAL: "When was the last time you were normal (no symptoms)?"     Felt normal prior to onset of numbness 4. PATTERN "Does this come and go, or has it been constant since it started?"  "Is it present now?"     Comes and goes; noticed "it was bad a week ago"; lasted about 3 days off and on.    5. CARDIAC SYMPTOMS: "Have you had any of the  following symptoms: chest pain, difficulty breathing, palpitations?"     Denies the above 6. NEUROLOGIC SYMPTOMS: "Have you had any of the following symptoms: headache, dizziness, vision loss, double vision, changes in speech, unsteady on your feet?"     Denied headache, dizziness, vision loss, balance or speech; had left eye blurring with numbness in eye area 7. OTHER SYMPTOMS: "Do you have any other symptoms?"     No  8. PREGNANCY: "Is there any chance you are pregnant?" "When was your last menstrual period?"     hysterectomy  Protocols used: NEUROLOGIC DEFICIT-A-AH

## 2018-02-23 ENCOUNTER — Telehealth: Payer: Self-pay | Admitting: *Deleted

## 2018-02-23 ENCOUNTER — Encounter: Payer: Self-pay | Admitting: *Deleted

## 2018-02-23 DIAGNOSIS — R51 Headache: Principal | ICD-10-CM

## 2018-02-23 DIAGNOSIS — M792 Neuralgia and neuritis, unspecified: Secondary | ICD-10-CM

## 2018-02-23 DIAGNOSIS — M4722 Other spondylosis with radiculopathy, cervical region: Secondary | ICD-10-CM

## 2018-02-23 DIAGNOSIS — R519 Headache, unspecified: Secondary | ICD-10-CM

## 2018-02-23 DIAGNOSIS — G8929 Other chronic pain: Secondary | ICD-10-CM

## 2018-02-23 DIAGNOSIS — M5030 Other cervical disc degeneration, unspecified cervical region: Secondary | ICD-10-CM

## 2018-02-23 DIAGNOSIS — M4802 Spinal stenosis, cervical region: Secondary | ICD-10-CM

## 2018-02-23 NOTE — Telephone Encounter (Signed)
Patients Insurance company denied coverage for MRI . Please advise

## 2018-02-23 NOTE — Telephone Encounter (Signed)
Please inform patient, unfortunately her insurance company has denied her MRI.  The reason for denial is that she has not had therapy for 6 weeks since presenting with complaints on  February 17, 2018. -For this reason I would recommend she do the B12 injections.  Take the diclofenac as prescribed by prior physician. -Consider steroid for acute symptoms, however she reports a reaction when taking prednisone and that would be the medication of choice. -I will refer her to neurology in the meantime, and by the time she is established with them and on the medications they should be able to have an MRI approved through neurology.  -If she has symptoms prior to her neurology appointment or MRI approval, she should go to the emergency room and they would likely obtain the MRI.

## 2018-02-23 NOTE — Telephone Encounter (Signed)
Spoke with patient reviewed information and instructions patient verbalized understanding . She is also going to follow up with her Neurosurgeon regarding her back issues.

## 2018-02-26 ENCOUNTER — Ambulatory Visit (INDEPENDENT_AMBULATORY_CARE_PROVIDER_SITE_OTHER): Payer: BLUE CROSS/BLUE SHIELD

## 2018-02-26 ENCOUNTER — Other Ambulatory Visit: Payer: BLUE CROSS/BLUE SHIELD

## 2018-02-26 DIAGNOSIS — E538 Deficiency of other specified B group vitamins: Secondary | ICD-10-CM | POA: Diagnosis not present

## 2018-02-26 MED ORDER — CYANOCOBALAMIN 1000 MCG/ML IJ SOLN
1000.0000 ug | INTRAMUSCULAR | Status: AC
  Administered 2018-02-26 – 2018-03-19 (×3): 1000 ug via INTRAMUSCULAR

## 2018-02-26 NOTE — Progress Notes (Signed)
Patient will get once weekly doses for total of 4 doses then will get one dose every 2 weeks for additional 4 doses

## 2018-02-26 NOTE — Progress Notes (Signed)
Patient presented today for 1 of 8 total B12 injections.   Original order: B12 1000 mcg injection once a week for 4 doses, then every 2 weeks for 4 doses. Injections to be given in office by nurse visit.    Patient is to have a provider appointment to occur at the end of her injection series.  Medical screening examination/treatment/procedure(s) were performed by non-physician practitioner and as supervising physician I was immediately available for consultation/collaboration.  I agree with above assessment and plan.  Electronically Signed by: Howard Pouch, DO [facility]

## 2018-02-26 NOTE — Progress Notes (Signed)
Pt is presented to our department for her 1 of 4 B12 injection. Pt tolerated meds well with no complain.

## 2018-02-27 ENCOUNTER — Other Ambulatory Visit: Payer: BLUE CROSS/BLUE SHIELD

## 2018-03-02 ENCOUNTER — Ambulatory Visit: Payer: BLUE CROSS/BLUE SHIELD | Admitting: Family Medicine

## 2018-03-05 ENCOUNTER — Ambulatory Visit (INDEPENDENT_AMBULATORY_CARE_PROVIDER_SITE_OTHER): Payer: BLUE CROSS/BLUE SHIELD

## 2018-03-05 DIAGNOSIS — E538 Deficiency of other specified B group vitamins: Secondary | ICD-10-CM

## 2018-03-05 NOTE — Progress Notes (Addendum)
Patient presents today for #2 of #4 weekly Vit. B12 injections. After completion of weekly doses patient will get 4 additional doses every other week. Given with no incidence or problems. Patient left with no complaints.  Medical screening examination/treatment/procedure(s) were performed by non-physician practitioner and as supervising physician I was immediately available for consultation/collaboration.  I agree with above assessment and plan.  Electronically Signed by: Howard Pouch, DO [place] [facility] [place]

## 2018-03-10 ENCOUNTER — Ambulatory Visit (INDEPENDENT_AMBULATORY_CARE_PROVIDER_SITE_OTHER): Payer: BLUE CROSS/BLUE SHIELD

## 2018-03-10 DIAGNOSIS — E538 Deficiency of other specified B group vitamins: Secondary | ICD-10-CM

## 2018-03-10 MED ORDER — CYANOCOBALAMIN 1000 MCG/ML IJ SOLN
1000.0000 ug | Freq: Once | INTRAMUSCULAR | Status: AC
  Administered 2018-03-10: 1000 ug via INTRAMUSCULAR

## 2018-03-10 NOTE — Progress Notes (Addendum)
  Patricia Alvarez is a 57 y.o. female presents to the office today for #3 of #4 Vitamin B12 injection weekly for 4 weeks then biweekly, per physician's orders. Original order: Vitamin B12 1,058mcg, IM was administered in left deltoid today. Patient tolerated injection. Patient due for follow up labs/provider appt: No. Date due, appt made Yes Patient next injection due: 03/17/18, appt made Yes  [place] screening examination/treatment/procedure(s) were performed by non-physician practitioner and as supervising physician I was immediately available for consultation/collaboration.  I agree with above assessment and plan.  Electronically Signed by: Howard Pouch, DO [place] [facility] [place]

## 2018-03-11 ENCOUNTER — Ambulatory Visit: Payer: BLUE CROSS/BLUE SHIELD

## 2018-03-19 ENCOUNTER — Ambulatory Visit (INDEPENDENT_AMBULATORY_CARE_PROVIDER_SITE_OTHER): Payer: BLUE CROSS/BLUE SHIELD

## 2018-03-19 DIAGNOSIS — E538 Deficiency of other specified B group vitamins: Secondary | ICD-10-CM | POA: Diagnosis not present

## 2018-03-19 NOTE — Progress Notes (Addendum)
Patricia Alvarez is a 57 y.o. female presents to the office today for Vitamin B12 injection per physician's orders. Original order: 02/23/18 Vitamin B12, 1055mcg, IM was administered Right deltoid today. Patient tolerated injection. Patient due for follow up labs/provider appt: No.  appt made No  Patient has had #4 of #4 weekly injections and will start every 2 weeks for next injection. Patient next injection due: 2 weeks, June 7, appt made Yes  [place] screening examination/treatment/procedure(s) were performed by non-physician practitioner and as supervising physician I was immediately available for consultation/collaboration.  I agree with above assessment and plan.  Electronically Signed by: Howard Pouch, DO [place] [facility] [place]

## 2018-04-02 ENCOUNTER — Ambulatory Visit (INDEPENDENT_AMBULATORY_CARE_PROVIDER_SITE_OTHER): Payer: BLUE CROSS/BLUE SHIELD | Admitting: Family Medicine

## 2018-04-02 DIAGNOSIS — E538 Deficiency of other specified B group vitamins: Secondary | ICD-10-CM | POA: Diagnosis not present

## 2018-04-02 MED ORDER — CYANOCOBALAMIN 1000 MCG/ML IJ SOLN
1000.0000 ug | Freq: Once | INTRAMUSCULAR | Status: AC
Start: 2018-04-02 — End: 2018-04-02
  Administered 2018-04-02: 1000 ug via INTRAMUSCULAR

## 2018-04-02 NOTE — Progress Notes (Addendum)
Patricia Alvarez is a 57 y.o. female presents to the office today for Vitamin B12 injections, per physician's orders. Original order:02/23/18  Vitamin B12, 1042mcg IM was administered in left deltoid today. Patient tolerated injection well. Patient had #1 of #4 every two week injections.  Patient due for follow up labs/provider appt: No.   Patient next injection due: 2 weeks   [place] Flow  Medical screening examination/treatment/procedure(s) were performed by non-physician practitioner and as supervising physician I was immediately available for consultation/collaboration.  I agree with above assessment and plan.  Electronically Signed by: Howard Pouch, DO [place] [facility] [place]

## 2018-04-15 ENCOUNTER — Emergency Department (HOSPITAL_COMMUNITY): Payer: BLUE CROSS/BLUE SHIELD

## 2018-04-15 ENCOUNTER — Emergency Department (HOSPITAL_COMMUNITY)
Admission: EM | Admit: 2018-04-15 | Discharge: 2018-04-15 | Disposition: A | Discharge status: Home / Self Care | Payer: BLUE CROSS/BLUE SHIELD | Attending: Emergency Medicine | Admitting: Emergency Medicine

## 2018-04-15 ENCOUNTER — Other Ambulatory Visit: Payer: Self-pay

## 2018-04-15 ENCOUNTER — Encounter (HOSPITAL_COMMUNITY): Payer: Self-pay | Admitting: Emergency Medicine

## 2018-04-15 DIAGNOSIS — R200 Anesthesia of skin: Secondary | ICD-10-CM | POA: Diagnosis not present

## 2018-04-15 DIAGNOSIS — H539 Unspecified visual disturbance: Secondary | ICD-10-CM | POA: Diagnosis not present

## 2018-04-15 DIAGNOSIS — R51 Headache: Secondary | ICD-10-CM | POA: Diagnosis not present

## 2018-04-15 DIAGNOSIS — Z79899 Other long term (current) drug therapy: Secondary | ICD-10-CM | POA: Diagnosis not present

## 2018-04-15 DIAGNOSIS — G43109 Migraine with aura, not intractable, without status migrainosus: Secondary | ICD-10-CM | POA: Diagnosis not present

## 2018-04-15 LAB — CBC
HEMATOCRIT: 41.9 % (ref 36.0–46.0)
Hemoglobin: 13.8 g/dL (ref 12.0–15.0)
MCH: 31.2 pg (ref 26.0–34.0)
MCHC: 32.9 g/dL (ref 30.0–36.0)
MCV: 94.6 fL (ref 78.0–100.0)
PLATELETS: 242 10*3/uL (ref 150–400)
RBC: 4.43 MIL/uL (ref 3.87–5.11)
RDW: 12.8 % (ref 11.5–15.5)
WBC: 3.8 10*3/uL — ABNORMAL LOW (ref 4.0–10.5)

## 2018-04-15 LAB — COMPREHENSIVE METABOLIC PANEL
ALBUMIN: 4 g/dL (ref 3.5–5.0)
ALK PHOS: 61 U/L (ref 38–126)
ALT: 13 U/L — ABNORMAL LOW (ref 14–54)
AST: 23 U/L (ref 15–41)
Anion gap: 6 (ref 5–15)
BILIRUBIN TOTAL: 0.7 mg/dL (ref 0.3–1.2)
BUN: 9 mg/dL (ref 6–20)
CALCIUM: 9 mg/dL (ref 8.9–10.3)
CO2: 26 mmol/L (ref 22–32)
Chloride: 106 mmol/L (ref 101–111)
Creatinine, Ser: 0.85 mg/dL (ref 0.44–1.00)
Glucose, Bld: 101 mg/dL — ABNORMAL HIGH (ref 65–99)
POTASSIUM: 4.1 mmol/L (ref 3.5–5.1)
Sodium: 138 mmol/L (ref 135–145)
Total Protein: 6.5 g/dL (ref 6.5–8.1)

## 2018-04-15 LAB — I-STAT CHEM 8, ED
BUN: 9 mg/dL (ref 6–20)
CALCIUM ION: 1.16 mmol/L (ref 1.15–1.40)
Chloride: 103 mmol/L (ref 101–111)
Creatinine, Ser: 0.8 mg/dL (ref 0.44–1.00)
Glucose, Bld: 98 mg/dL (ref 65–99)
HEMATOCRIT: 41 % (ref 36.0–46.0)
HEMOGLOBIN: 13.9 g/dL (ref 12.0–15.0)
Potassium: 4.1 mmol/L (ref 3.5–5.1)
Sodium: 140 mmol/L (ref 135–145)
TCO2: 24 mmol/L (ref 22–32)

## 2018-04-15 LAB — DIFFERENTIAL
Abs Immature Granulocytes: 0 10*3/uL (ref 0.0–0.1)
Basophils Absolute: 0 10*3/uL (ref 0.0–0.1)
Basophils Relative: 1 %
EOS ABS: 0.2 10*3/uL (ref 0.0–0.7)
EOS PCT: 4 %
Immature Granulocytes: 0 %
LYMPHS ABS: 1.6 10*3/uL (ref 0.7–4.0)
LYMPHS PCT: 41 %
MONO ABS: 0.3 10*3/uL (ref 0.1–1.0)
Monocytes Relative: 7 %
Neutro Abs: 1.8 10*3/uL (ref 1.7–7.7)
Neutrophils Relative %: 47 %

## 2018-04-15 LAB — APTT: APTT: 28 s (ref 24–36)

## 2018-04-15 LAB — I-STAT BETA HCG BLOOD, ED (MC, WL, AP ONLY)

## 2018-04-15 LAB — I-STAT TROPONIN, ED: TROPONIN I, POC: 0 ng/mL (ref 0.00–0.08)

## 2018-04-15 LAB — PROTIME-INR
INR: 0.92
PROTHROMBIN TIME: 12.3 s (ref 11.4–15.2)

## 2018-04-15 MED ORDER — AMITRIPTYLINE HCL 25 MG PO TABS: 25.0000 mg | ORAL_TABLET | Freq: Every day | ORAL | 1 refills | Status: DC

## 2018-04-15 MED ORDER — AMITRIPTYLINE HCL 25 MG PO TABS: 25.0000 mg | ORAL_TABLET | Freq: Every day | ORAL | Status: DC

## 2018-04-15 NOTE — Discharge Instructions (Addendum)
MRI negative.  Both myself and neurology consultant think that this is probably related to complicated migraines.  Neurology is recommending amitriptyline 25 mg at bedtime until you are seen by neurology on July 8.  Follow-up with your primary doctor as needed.  Follow-up with neurology earlier if possible.

## 2018-04-15 NOTE — ED Provider Notes (Signed)
[facility] EMERGENCY DEPARTMENT Provider Note   CSN: [id number] Arrival date & time: 04/15/18  1122     History   Chief Complaint Chief Complaint  Patient presents with  . Stroke Symptoms    HPI Patricia Alvarez is a 57 y.o. female.  Patient brought herself to the emergency department.  Patient presenting with concerns for possible stroke.  Patient has a history of B12 deficiency vitamin D deficiency and has had a history of migraines since age 33.  Patient's migraine headache pain is usually in the occipital area.  She has had episodes where she has had loss of vision and arm numbness and is also had bilateral occipital pain.  Lately these headaches become more frequently particularly since April.  Would occurring about twice a week.  Apparently yesterday was an 8 out of 10 with bilateral facial numbness and left arm numbness however today while in the ED ED she is now stating it is gotten much better is only 2 out of 10.  Her primary care doctor has referred her to neurology and she has appointment on July 8.  She was referred to [facility] neurology.  Patient also has a concern of MS this was raised by her primary care doctor as well.     Past Medical History:  Diagnosis Date  . Allergic rhinitis   . Allergy   . B12 deficiency   . Back pain   . Chicken pox   . Colon polyp 2015   TUBULAR ADENOMA (X 1).  . DDD (degenerative disc disease), cervical   . Endometriosis of uterus   . GERD (gastroesophageal reflux disease)   . Hyperlipidemia   . IBS (irritable bowel syndrome)   . Internal hemorrhoid   . Migraines   . Rectal bleeding   . Vitamin D deficiency     Patient Active Problem List   Diagnosis Date Noted  . Hypoesthesia of skin 02/17/2018  . Encounter for long-term (current) use of medications 02/17/2018  . Chronic nonintractable headache 02/17/2018  . Neuralgia 02/17/2018  . Abnormal hearing test 02/17/2018  . Cervical spondylosis with radiculopathy  02/17/2018  . 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 DDD (degenerative disc disease), cervical   . Migraines   . Hx of adenomatous colonic polyps 01/12/2017  . GERD 10/02/2009  . HYPERLIPIDEMIA-MIXED 02/14/2009  . Vitamin D deficiency 11/23/2008  . B12 deficiency 10/07/2007    Past Surgical History:  Procedure Laterality Date  . APPENDECTOMY  02/2000   and ovary  . AUGMENTATION MAMMAPLASTY Bilateral 1998   Saline,retropectoral   . COLONOSCOPY W/ BIOPSIES  2015   multiple  . ESOPHAGOGASTRODUODENOSCOPY  2011   multiple  . LAPAROSCOPY    . OOPHORECTOMY Left 02/2000   w/appendectomy  . PARTIAL HYSTERECTOMY  1994   right ovary and uterus     OB History    Gravida  4   Para  2   Term  2   Preterm      AB  2   Living  2     SAB      TAB      Ectopic      Multiple      Live Births               Home Medications    Prior to Admission medications   Medication Sig Start Date End Date Taking? Authorizing Provider  cyanocobalamin (,VITAMIN B-12,) 1000 MCG/ML injection Inject 1 mL (1,000 mcg  total) into the muscle every 30 (thirty) days. Patient not taking: Reported on 01/12/2017 09/05/14   Carollee Herter, Alferd Apa, DO  diclofenac (VOLTAREN) 75 MG EC tablet Take 75 mg by mouth 2 (two) times daily as needed. 01/30/18   [provider]  estradiol (ESTRACE) 0.5 MG tablet Take 0.5 mg by mouth daily. 02/03/18   [provider]  famotidine (PEPCID) 40 MG tablet TAKE 1 TABLET (40 MG TOTAL) BY MOUTH 2 (TWO) TIMES DAILY. 08/21/16   Esterwood, Amy S, PA-C  omeprazole (PRILOSEC) 40 MG capsule Take 1 capsule (40 mg total) by mouth 2 (two) times daily. 01/12/17   Ladene Artist, MD  Vitamin D, Ergocalciferol, (DRISDOL) 50000 units CAPS capsule Take 1 capsule (50,000 Units total) by mouth every 7 (seven) days. 02/18/18   Ma Hillock, DO    Family History Family History  Problem Relation Age of Onset  . Hyperlipidemia Mother 27  . Heart failure  Mother 97  . Arthritis Mother   . Asthma Mother   . Hearing loss Mother   . Heart disease Mother   . Hypertension Mother   . Miscarriages / [place] Mother   . Colon polyps Father   . Stroke Father   . Hearing loss Father   . Breast cancer Maternal Grandmother 78  . Colon cancer Neg Hx     Social History Social History   Tobacco Use  . Smoking status: Never Smoker  . Smokeless tobacco: Never Used  Substance Use Topics  . Alcohol use: No  . Drug use: No     Allergies   Codeine; Morphine; and Prednisone   Review of Systems Review of Systems  Constitutional: Negative for fever.  HENT: Positive for drooling. Negative for congestion.   Eyes: Positive for visual disturbance.  Respiratory: Negative for shortness of breath.   Cardiovascular: Negative for chest pain.  Gastrointestinal: Negative for abdominal pain.  Genitourinary: Negative for dysuria.  Musculoskeletal: Negative for back pain and neck pain.  Skin: Negative for rash.  Neurological: Positive for numbness and headaches. Negative for speech difficulty and weakness.  Hematological: Does not bruise/bleed easily.  Psychiatric/Behavioral: Negative for confusion.     Physical Exam Updated Vital Signs BP 119/82   Pulse 80   Resp 15   SpO2 98%   Physical Exam  Constitutional: She is oriented to person, place, and time. She appears well-developed and well-nourished. No distress.  HENT:  Head: Normocephalic and atraumatic.  Mouth/Throat: Oropharynx is clear and moist.  Eyes: Pupils are equal, round, and reactive to light. Conjunctivae and EOM are normal.  Neck: Normal range of motion. Neck supple.  Cardiovascular: Normal rate, regular rhythm and normal heart sounds.  No murmur heard. Pulmonary/Chest: Effort normal and breath sounds normal. No respiratory distress.  Abdominal: Soft. Bowel sounds are normal. There is no tenderness.  Musculoskeletal: Normal range of motion. She exhibits no edema.    Neurological: She is alert and oriented to person, place, and time. No cranial nerve deficit or sensory deficit. She exhibits normal muscle tone. Coordination normal.  Skin: Skin is warm. No rash noted.  Nursing note and vitals reviewed.    ED Treatments / Results  Labs (all labs ordered are listed, but only abnormal results are displayed) Labs Reviewed  CBC - Abnormal; Notable for the following components:      Result Value   WBC 3.8 (*)    All other components within normal limits  COMPREHENSIVE METABOLIC PANEL - Abnormal; Notable for the  following components:   Glucose, Bld 101 (*)    ALT 13 (*)    All other components within normal limits  PROTIME-INR  APTT  DIFFERENTIAL  I-STAT TROPONIN, ED  CBG MONITORING, ED  I-STAT CHEM 8, ED  I-STAT BETA HCG BLOOD, ED ([facility], [facility], [facility] ONLY)    EKG EKG Interpretation  Date/Time:  Thursday April 15 2018 11:28:25 EDT Ventricular Rate:  80 PR Interval:  136 QRS Duration: 86 QT Interval:  384 QTC Calculation: 442 R Axis:   64 Text Interpretation:  Normal sinus rhythm Normal ECG Confirmed by Fredia Sorrow [id number]) on 04/15/2018 12:32:30 PM   Radiology Ct Head Wo Contrast  Result Date: 04/15/2018 CLINICAL DATA:  Onset of headache yesterday, jaw tightness, focal neural deficit of less than 6 hours stroke suspected EXAM: CT HEAD WITHOUT CONTRAST TECHNIQUE: Contiguous axial images were obtained from the base of the skull through the vertex without intravenous contrast. Sagittal and coronal MPR images reconstructed from axial data set. COMPARISON:  None FINDINGS: Brain: Normal ventricular morphology. No midline shift or mass effect. Normal appearance of brain parenchyma. No intracranial hemorrhage, mass lesion, or evidence of acute infarction. No extra-axial fluid collections. Vascular: Unremarkable Skull: Intact Sinuses/Orbits: Clear Other: N/A IMPRESSION: Normal exam. Electronically Signed   By: Lavonia Dana M.D.   On: 04/15/2018 12:20   Mr  Brain Wo Contrast (neuro Protocol)  Result Date: 04/15/2018 CLINICAL DATA:  Numbness and headache. EXAM: MRI HEAD WITHOUT CONTRAST TECHNIQUE: Multiplanar, multiecho pulse sequences of the brain and surrounding structures were obtained without intravenous contrast. COMPARISON:  CT head earlier today.  MR head 12/05/2015. FINDINGS: Brain: No evidence for acute infarction, hemorrhage, mass lesion, hydrocephalus, or extra-axial fluid. Normal for age cerebral volume. Minor foci of white matter signal abnormality, nonacute, uncertain clinical significance. Considerations include complicated migraine, vasculitis, chronic infection, early small vessel disease, or idiopathic. Vascular: Flow voids are maintained throughout the carotid, basilar, and vertebral arteries. There are no areas of chronic hemorrhage. Skull and upper cervical spine: Unremarkable visualized calvarium, skullbase, and cervical vertebrae. Pituitary, pineal, cerebellar tonsils unremarkable. No upper cervical cord lesions. Sinuses/Orbits: No orbital masses or proptosis. Globes appear symmetric. Sinuses appear well aerated, without evidence for air-fluid level. Other: No nasopharyngeal pathology or mastoid fluid. Scalp and other visualized extracranial soft tissues grossly unremarkable. Compared with prior MR from 2017, white matter disease has not significantly progressed. IMPRESSION: No acute or focal intracranial abnormality. Good general agreement with prior normal CT. Minor foci of white matter signal abnormality, stable since 0092, uncertain, but likely doubtful, clinical significance. Electronically Signed   By: Staci Righter M.D.   On: 04/15/2018 14:02    Procedures Procedures (including critical care time)  CRITICAL CARE Performed by: Fredia Sorrow Total critical care time: 30 minutes Critical care time was exclusive of separately billable procedures and treating other patients. Critical care was necessary to treat or prevent imminent  or life-threatening deterioration. Critical care was time spent personally by me on the following activities: development of treatment plan with patient and/or surrogate as well as nursing, discussions with consultants, evaluation of patient's response to treatment, examination of patient, obtaining history from patient or surrogate, ordering and performing treatments and interventions, ordering and review of laboratory studies, ordering and review of radiographic studies, pulse oximetry and re-evaluation of patient's condition.   Medications Ordered in ED Medications  amitriptyline (ELAVIL) tablet 25 mg (has no administration in time range)     Initial Impression / Assessment and Plan / ED Course  I have reviewed the triage vital signs and the nursing notes.  Pertinent labs & imaging results that were available during my care of the patient were reviewed by me and considered in my medical decision making (see chart for details).    Code stroke  orders initiated in triage.  Patient seen by me initially prior to being seen by neurology.  Suggested to urology I thought this may be complicated migraine history since she has had frequent episodes since April and has a long-standing prior history of migraines.  Patient seen in consultation by neurology.  They agreed.  We opted to do MRI brain although head CT negative because of the concerns for MS.  MRI brain without any acute findings.  There was previous MRI brain in 2017 that had an abnormal findings comparison with today that appears to be unchanged and radiology thinks is insignificant.  No evidence of MS.  Patient does not like taking steroids so neurology recommended treating the comp located migraines with amitriptyline 25 mg at bedtime and continuing her plan to follow-up with neurology on July 8.  Patient without any new or worse symptoms while here.  Patient without significant migraine pain so no formal migraine cocktail provided  here.   Final Clinical Impressions(s) / ED Diagnoses   Final diagnoses:  Complicated migraine    ED Discharge Orders    None       Fredia Sorrow, MD 04/15/18 1524

## 2018-04-15 NOTE — ED Notes (Signed)
Pt refused wheelchair when asked and ambulated back to room with a steady gait.

## 2018-04-15 NOTE — Consult Note (Addendum)
NEURO HOSPITALIST CONSULT NOTE   Requestig physician: Dr. Rogene Houston   Reason for Consult headache   History obtained from:  Patient     HPI:                                                                                                                                          Patricia Alvarez is an 57 y.o. female with B12 deficiency, migraines, vitamin D deficiency, cervical degenerative disc disease.  She states that she has had migraine headaches since she was 24.  Her migraine headaches initially started out with bilateral occipital pain that would become throbbing in nature.  They later developed into having symptoms such as loss of vision or arm numbness which then was followed by again occipital, pounding, bilateral pain.  She often times would take Excedrin Migraine.  She also endorses that she has photophobia, phonophobia, and pain to palpation of her scalp during these episodes.  Often times she can get in a dark room and sleep it off.  Lately her headaches have become more frequent to the point that she is having them twice a week.  Yesterday apparently was an 8/10 with bilateral facial numbness and left arm numbness however today while in the ED she is now stating it is gotten much better a 2/10.  She does have a appointment with Dr. Leta Baptist on July 8 and she is also told [facility] neurology that if they have a opening anytime for she be more than willing to come.  Past Medical History:  Diagnosis Date  . Allergic rhinitis   . Allergy   . B12 deficiency   . Back pain   . Chicken pox   . Colon polyp 2015   TUBULAR ADENOMA (X 1).  . DDD (degenerative disc disease), cervical   . Endometriosis of uterus   . GERD (gastroesophageal reflux disease)   . Hyperlipidemia   . IBS (irritable bowel syndrome)   . Internal hemorrhoid   . Migraines   . Rectal bleeding   . Vitamin D deficiency     Past Surgical History:  Procedure Laterality Date  . APPENDECTOMY   02/2000   and ovary  . AUGMENTATION MAMMAPLASTY Bilateral 1998   Saline,retropectoral   . COLONOSCOPY W/ BIOPSIES  2015   multiple  . ESOPHAGOGASTRODUODENOSCOPY  2011   multiple  . LAPAROSCOPY    . OOPHORECTOMY Left 02/2000   w/appendectomy  . PARTIAL HYSTERECTOMY  1994   right ovary and uterus    Family History  Problem Relation Age of Onset  . Hyperlipidemia Mother 76  . Heart failure Mother 77  . Arthritis Mother   . Asthma Mother   . Hearing loss Mother   . Heart disease Mother   . Hypertension Mother   .  Miscarriages / [place] Mother   . Colon polyps Father   . Stroke Father   . Hearing loss Father   . Breast cancer Maternal Grandmother 34  . Colon cancer Neg Hx               Social History:  reports that she has never smoked. She has never used smokeless tobacco. She reports that she does not drink alcohol or use drugs.  Allergies  Allergen Reactions  . Codeine Itching and Nausea Only    REACTION: nausea  . Morphine Nausea Only    REACTION: nausea  . Prednisone     REACTION: Hyperactive    MEDICATIONS:                                                                                                                     No current facility-administered medications for this encounter.    Current Outpatient Medications  Medication Sig Dispense Refill  . cyanocobalamin (,VITAMIN B-12,) 1000 MCG/ML injection Inject 1 mL (1,000 mcg total) into the muscle every 30 (thirty) days. (Patient not taking: Reported on 01/12/2017) 10 mL 0  . diclofenac (VOLTAREN) 75 MG EC tablet Take 75 mg by mouth 2 (two) times daily as needed.  3  . estradiol (ESTRACE) 0.5 MG tablet Take 0.5 mg by mouth daily.  3  . famotidine (PEPCID) 40 MG tablet TAKE 1 TABLET (40 MG TOTAL) BY MOUTH 2 (TWO) TIMES DAILY. 60 tablet 3  . omeprazole (PRILOSEC) 40 MG capsule Take 1 capsule (40 mg total) by mouth 2 (two) times daily. 60 capsule 11  . Vitamin D, Ergocalciferol, (DRISDOL) 50000 units CAPS  capsule Take 1 capsule (50,000 Units total) by mouth every 7 (seven) days. 12 capsule 0      ROS:                                                                                                                                       History obtained from the patient  General ROS: negative for - chills, fatigue, fever, night sweats, weight gain or weight loss Psychological ROS: negative for - behavioral disorder, hallucinations, memory difficulties, mood swings or suicidal ideation Ophthalmic ROS: negative for - blurry vision, double vision, eye pain or loss of vision ENT ROS: negative for - epistaxis, nasal discharge, oral lesions, sore throat, tinnitus or vertigo Allergy and Immunology ROS: negative for - hives or  itchy/watery eyes Hematological and Lymphatic ROS: negative for - bleeding problems, bruising or swollen lymph nodes Endocrine ROS: negative for - galactorrhea, hair pattern changes, polydipsia/polyuria or temperature intolerance Respiratory ROS: negative for - cough, hemoptysis, shortness of breath or wheezing Cardiovascular ROS: negative for - chest pain, dyspnea on exertion, edema or irregular heartbeat Gastrointestinal ROS: negative for - abdominal pain, diarrhea, hematemesis, nausea/vomiting or stool incontinence Genito-Urinary ROS: negative for - dysuria, hematuria, incontinence or urinary frequency/urgency Musculoskeletal ROS: negative for - joint swelling or muscular weakness Neurological ROS: as noted in HPI Dermatological ROS: negative for rash and skin lesion changes   Blood pressure 119/82, pulse 80, resp. rate 15, SpO2 98 %.   General Examination:                                                                                                       Physical Exam  HEENT-  Normocephalic, no lesions, without obvious abnormality.  Normal external eye and conjunctiva.   Extremities- Warm, dry and intact Musculoskeletal-no joint tenderness, deformity or  swelling Skin-warm and dry, no hyperpigmentation, vitiligo, or suspicious lesions  Neurological Examination Mental Status: Alert, oriented, thought content appropriate.  Speech fluent without evidence of aphasia.  Able to follow 3 step commands without difficulty. Cranial Nerves: II: Visual fields grossly normal,  III,IV, VI: ptosis not present, extra-ocular motions intact bilaterally pupils equal, round, reactive to light and accommodation V,VII: smile symmetric, facial light touch sensation normal bilaterally VIII: hearing normal bilaterally IX,X: uvula rises symmetrically XI: bilateral shoulder shrug XII: midline tongue extension Motor: Right : Upper extremity   5/5    Left:     Upper extremity   5/5  Lower extremity   5/5     Lower extremity   5/5 Tone and bulk:normal tone throughout; no atrophy noted Sensory: Pinprick and light touch intact throughout, bilaterally Deep Tendon Reflexes: 2+ and symmetric throughout Plantars: Right: downgoing   Left: downgoing Cerebellar: normal finger-to-nose, and normal heel-to-shin test Gait: Tested   Lab Results: Basic Metabolic Panel: Recent Labs  Lab 04/15/18 1137 04/15/18 1142  NA 138 140  K 4.1 4.1  CL 106 103  CO2 26  --   GLUCOSE 101* 98  BUN 9 9  CREATININE 0.85 0.80  CALCIUM 9.0  --     CBC: Recent Labs  Lab 04/15/18 1137 04/15/18 1142  WBC 3.8*  --   NEUTROABS 1.8  --   HGB 13.8 13.9  HCT 41.9 41.0  MCV 94.6  --   PLT 242  --     Cardiac Enzymes: No results for input(s): CKTOTAL, CKMB, CKMBINDEX, TROPONINI in the last 168 hours.  Lipid Panel: No results for input(s): CHOL, TRIG, HDL, CHOLHDL, VLDL, LDLCALC in the last 168 hours.  Imaging: Ct Head Wo Contrast  Result Date: 04/15/2018 CLINICAL DATA:  Onset of headache yesterday, jaw tightness, focal neural deficit of less than 6 hours stroke suspected EXAM: CT HEAD WITHOUT CONTRAST TECHNIQUE: Contiguous axial images were obtained from the base of the skull  through the vertex without intravenous contrast. Sagittal and  coronal MPR images reconstructed from axial data set. COMPARISON:  None FINDINGS: Brain: Normal ventricular morphology. No midline shift or mass effect. Normal appearance of brain parenchyma. No intracranial hemorrhage, mass lesion, or evidence of acute infarction. No extra-axial fluid collections. Vascular: Unremarkable Skull: Intact Sinuses/Orbits: Clear Other: N/A IMPRESSION: Normal exam. Electronically Signed   By: Lavonia Dana M.D.   On: 04/15/2018 12:20    Assessment and plan per attending neurologist  Etta Quill PA-C [facility] [phone]  04/15/2018, 12:57 PM   Assessment/ D89-year-old female presenting to the emergency department with occipital headache and neurological symptoms including bilateral facial numbness left arm numbness.  At this time headache is a 2/5 and her neurological symptoms have subsided.  Patient states that yesterday she was a 8/10.  She has a history of migraines which are very similar to this with the occipital pain however the neurological sign symptoms have increased.   Impression: -Complicated migraine  Recommendations:  - Amitriptyline 25 mg nightly - Follow-up with neurological visit with Dr. Leta Baptist on July 8 - Would avoid alcohol, cheese, and get good nights rest    NEUROHOSPITALIST ADDENDUM  Attempted to see this patient however she was in MRI at the time. Later, when I went to assess her she was already discharged from the ED.  I, however did discuss the plan with PA-C and agree with the recommendations.  Billing of this note will be done by Etta Quill, PA-C.    Karena Addison Aroor MD [facility] [phone]   If 7pm to 7am, please call on call as listed on AMION.

## 2018-04-15 NOTE — ED Triage Notes (Signed)
Patient to ED c/o recurrent facial numbness, tongue numbness, L arm weakness/tingling x 1 hour - states similar happened 2 years ago and she's been in contact with her PCP, who told her to go to ED if symptoms recurred. Patient endorsing headache as well that started yesterday. No facial droop, no drift, no vision changes, grip strength strong and equal. Pt does appear to have changes in speech, in which she states is because her tongue feels numb.

## 2018-04-15 NOTE — ED Notes (Signed)
Pt has returned from CT.  

## 2018-04-15 NOTE — ED Notes (Signed)
Pt taken strait back. EKG done and RN made aware of pts c/c and poss code stroke.

## 2018-04-15 NOTE — ED Provider Notes (Signed)
Patient placed in Quick Look pathway, seen and evaluated   Chief Complaint: numbness, HA  HPI:   Pt presenting for evaluation of numbness and HA. States that she has had a HA since yesterday. It is constant and severe. Additionally, pt developed numbness of bilateral face and weakness of L arm  1 hr PTA. 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 She was to f/u with neuro, but never did. Had similar sxs several months ago, PCP found low vitamin D and B12, started supplementation, and recommended neuro f/u. Apt in 5 wks. Today sxs started again, and tp came to ED. She reports blurry R vision, no vision loss. She is not on blood thinner. Take estrogen pills. No CP, SOB. No fall or injury.   ROS: HA  Physical Exam:   Gen: No distress  Neuro: Awake and Alert. No obvious neurologic deficits. CN intact. Grip strength equal. No pronator drift.   Skin: Warm   Stroke order set ordered but no code stroke called. Discussed with Dr. Melina Copa, who recommends neuro consult.   Discussed with Dr. Lorraine Lax, who recommended stat CT, and getting a CTA during visit. No code stroke called.   Initiation of care has begun. The patient has been counseled on the process, plan, and necessity for staying for the completion/evaluation, and the remainder of the medical screening examination    Franchot Heidelberg, PA-C 04/15/18 1204    Fredia Sorrow, MD 04/15/18 1453

## 2018-04-16 ENCOUNTER — Ambulatory Visit (INDEPENDENT_AMBULATORY_CARE_PROVIDER_SITE_OTHER): Payer: BLUE CROSS/BLUE SHIELD

## 2018-04-16 DIAGNOSIS — E538 Deficiency of other specified B group vitamins: Secondary | ICD-10-CM | POA: Diagnosis not present

## 2018-04-16 MED ORDER — CYANOCOBALAMIN 1000 MCG/ML IJ SOLN
1000.0000 ug | Freq: Once | INTRAMUSCULAR | Status: AC
  Administered 2018-04-16: 1000 ug via INTRAMUSCULAR

## 2018-04-16 NOTE — Progress Notes (Signed)
Patient presents today for Vitamin B12 injection. #2 of #4 of every 2 week injection. Patient to have labs and follow up after #4 injection. Patient has appointments scheduled. Injection given with no incidence or problems. Patient left with no complaints.

## 2018-04-18 NOTE — Progress Notes (Signed)
Pt with vit B12 deficiency.  Agree with vit B12 1000 mcg IM in office today. Signed:  Crissie Sickles, MD           04/18/2018

## 2018-04-28 DIAGNOSIS — Z01419 Encounter for gynecological examination (general) (routine) without abnormal findings: Secondary | ICD-10-CM | POA: Diagnosis not present

## 2018-04-28 DIAGNOSIS — Z6823 Body mass index (BMI) 23.0-23.9, adult: Secondary | ICD-10-CM | POA: Diagnosis not present

## 2018-04-30 ENCOUNTER — Ambulatory Visit (INDEPENDENT_AMBULATORY_CARE_PROVIDER_SITE_OTHER): Payer: BLUE CROSS/BLUE SHIELD | Admitting: *Deleted

## 2018-04-30 DIAGNOSIS — E538 Deficiency of other specified B group vitamins: Secondary | ICD-10-CM

## 2018-04-30 MED ORDER — CYANOCOBALAMIN 1000 MCG/ML IJ SOLN
1000.0000 ug | Freq: Once | INTRAMUSCULAR | Status: AC
  Administered 2018-04-30: 1000 ug via INTRAMUSCULAR

## 2018-04-30 NOTE — Progress Notes (Signed)
Patricia Alvarez is a 57 y.o. female presents to the office today for B 12 injection 3 of 4 injections, per physician's orders. Original order: 02/23/18 b 12(med), 1000 mcg (dose),  IM (route) was administered left deltoid (location) today. Patient tolerated injection. Patient due for follow up labs/provider appt: no. , Patient next injection due: 05/14/18, appt made yes  [name]

## 2018-05-02 ENCOUNTER — Other Ambulatory Visit: Payer: Self-pay | Admitting: Gastroenterology

## 2018-05-03 ENCOUNTER — Encounter

## 2018-05-03 ENCOUNTER — Encounter: Payer: Self-pay | Admitting: Diagnostic Neuroimaging

## 2018-05-03 ENCOUNTER — Ambulatory Visit: Payer: BLUE CROSS/BLUE SHIELD | Admitting: Diagnostic Neuroimaging

## 2018-05-03 ENCOUNTER — Telehealth: Payer: Self-pay | Admitting: Gastroenterology

## 2018-05-03 VITALS — BP 111/72 | HR 69 | Ht 64.5 in | Wt 143.6 lb

## 2018-05-03 DIAGNOSIS — G43109 Migraine with aura, not intractable, without status migrainosus: Secondary | ICD-10-CM

## 2018-05-03 MED ORDER — AMITRIPTYLINE HCL 25 MG PO TABS: 25.0000 mg | ORAL_TABLET | Freq: Every day | ORAL | 6 refills | Status: DC

## 2018-05-03 MED ORDER — RIZATRIPTAN BENZOATE 10 MG PO TBDP: 10.0000 mg | ORAL_TABLET | ORAL | 6 refills | Status: DC | PRN

## 2018-05-03 MED ORDER — OMEPRAZOLE 40 MG PO CPDR: 40.0000 mg | DELAYED_RELEASE_CAPSULE | Freq: Two times a day (BID) | ORAL | 0 refills | Status: DC

## 2018-05-03 NOTE — Patient Instructions (Signed)
MIGRAINE PREVENTION - amitriptyline 25mg  at bedtime  MIGRAINE RESCUE - rizatriptan 10mg  as needed for breakthrough headache; may repeat x 1 after 2 hours; max 2 tabs per day or 8 per month

## 2018-05-03 NOTE — Progress Notes (Signed)
[facility]  PATIENT: Patricia Alvarez DOB: [birth year]  REFERRING CLINICIAN: Raoul Pitch HISTORY FROM: patient  REASON FOR VISIT: new consult    HISTORICAL  CHIEF COMPLAINT:  Chief Complaint  Patient presents with  . NP  Dr. Raoul Pitch  . Chronic nonintractable headache.    Has hx migraine,  takes excedrin migraine 4 days wk. Hx 2 yrs ago, stabbing pain, L sided numbness, 01/2018 stabbing pain, L sided numbness, ED/ CT/MRI ,     HISTORY OF PRESENT ILLNESS:   57 year old female here for evaluation of headaches.  Patient had onset of headaches sometime in her late 22s.  She describes pounding, unilateral, mainly left-sided, throbbing severe headaches associated with nausea, blurred vision, sensitivity to sound, seeing dots and spots.  Headaches were intermittent for many years.  Patient treated these with over-the-counter medications.  The last couple of years patient has had slight change in her headaches with sharp shooting pain in her left ear, left eye, facial numbness, lip numbness, drooling sensation, arm numbness.  These also were associated with headaches.  Patient feeling headaches and pain behind her ears and behind her head.  Headaches increased in April 2019.  Now patient having headaches 3-4 times per week.  Patient had a CT scan and MRI of the brain, as well as emergency room evaluation, and was diagnosed with probable complicated migraine.  She was prescribed amitriptyline but has not tried it yet.  Patient has family history of migraine in her mother.  No specific triggering factors currently for her headaches.  Patient has a long history of somewhat interrupted sleep and mild insomnia.  She is able to fall asleep without difficulty but then tends to wake up in the middle the night and has difficulty falling back asleep.    REVIEW OF SYSTEMS: Full 14 system review of systems performed and negative with exception of: Headache numbness  fatigue.  ALLERGIES: Allergies  Allergen Reactions  . Codeine Itching and Nausea Only    REACTION: nausea  . Morphine Nausea Only    REACTION: nausea  . Prednisone     REACTION: Hyperactive    HOME MEDICATIONS: Outpatient Medications Prior to Visit  Medication Sig Dispense Refill  . cyanocobalamin (,VITAMIN B-12,) 1000 MCG/ML injection Inject 1 mL (1,000 mcg total) into the muscle every 30 (thirty) days. 10 mL 0  . diclofenac (VOLTAREN) 75 MG EC tablet Take 75 mg by mouth 2 (two) times daily as needed.  3  . estradiol (ESTRACE) 0.5 MG tablet Take 0.5 mg by mouth daily.  3  . omeprazole (PRILOSEC) 40 MG capsule Take 1 capsule (40 mg total) by mouth 2 (two) times daily. 60 capsule 11  . Vitamin D, Ergocalciferol, (DRISDOL) 50000 units CAPS capsule Take 1 capsule (50,000 Units total) by mouth every 7 (seven) days. 12 capsule 0  . famotidine (PEPCID) 40 MG tablet TAKE 1 TABLET (40 MG TOTAL) BY MOUTH 2 (TWO) TIMES DAILY. 60 tablet 3  . amitriptyline (ELAVIL) 25 MG tablet Take 1 tablet (25 mg total) by mouth at bedtime. (Patient not taking: Reported on 05/03/2018) 14 tablet 1   No facility-administered medications prior to visit.     PAST MEDICAL HISTORY: Past Medical History:  Diagnosis Date  . Allergic rhinitis   . Allergy   . B12 deficiency   . Back pain   . Chicken pox   . Colon polyp 2015   TUBULAR ADENOMA (X 1).  . DDD (degenerative disc disease), cervical   . Endometriosis  of uterus   . GERD (gastroesophageal reflux disease)   . Hyperlipidemia   . IBS (irritable bowel syndrome)   . Internal hemorrhoid   . Migraines   . Rectal bleeding   . Vitamin D deficiency     PAST SURGICAL HISTORY: Past Surgical History:  Procedure Laterality Date  . APPENDECTOMY  02/2000   and ovary  . AUGMENTATION MAMMAPLASTY Bilateral 1998   Saline,retropectoral   . COLONOSCOPY W/ BIOPSIES  2015   multiple  . ESOPHAGOGASTRODUODENOSCOPY  2011   multiple  . LAPAROSCOPY    . OOPHORECTOMY  Left 02/2000   w/appendectomy  . PARTIAL HYSTERECTOMY  1994   right ovary and uterus    FAMILY HISTORY: Family History  Problem Relation Age of Onset  . Hyperlipidemia Mother 80  . Heart failure Mother 37  . Arthritis Mother   . Asthma Mother   . Hearing loss Mother   . Heart disease Mother   . Hypertension Mother   . Miscarriages / [place] Mother   . Colon polyps Father   . Stroke Father   . Hearing loss Father   . Breast cancer Maternal Grandmother 42  . Colon cancer Neg Hx     SOCIAL HISTORY:  Social History   Socioeconomic History  . Marital status: Married    Spouse name: Not on file  . Number of children: 2  . Years of education: 43  . Highest education level: Not on file  Occupational History  . Occupation: Forensic psychologist: [employer]  . Financial resource strain: Not on file  . Food insecurity:    Worry: Not on file    Inability: Not on file  . Transportation needs:    Medical: Not on file    Non-medical: Not on file  Tobacco Use  . Smoking status: Never Smoker  . Smokeless tobacco: Never Used  Substance and Sexual Activity  . Alcohol use: No  . Drug use: No  . Sexual activity: Yes    Partners: Male    Birth control/protection: Surgical  Lifestyle  . Physical activity:    Days per week: Not on file    Minutes per session: Not on file  . Stress: Not on file  Relationships  . Social connections:    Talks on phone: Not on file    Gets together: Not on file    Attends religious service: Not on file    Active member of club or organization: Not on file    Attends meetings of clubs or organizations: Not on file    Relationship status: Not on file  . Intimate partner violence:    Fear of current or ex partner: Not on file    Emotionally abused: Not on file    Physically abused: Not on file    Forced sexual activity: Not on file  Other Topics Concern  . Not on file  Social History Narrative   Married. 2  children.    HS graduate. Employed as a Herbalist.    Uses bicycle helmet, smoke alarm in the home.   Feels safe in her relationships.   Caffeine- unsweet 1 quart most days.      PHYSICAL EXAM  GENERAL EXAM/CONSTITUTIONAL: Vitals:  Vitals:   05/03/18 0807  BP: 111/72  Pulse: 69  Weight: 143 lb 9.6 oz (65.1 kg)  Height: 5' 4.5" (1.638 m)     Body mass index is 24.27 kg/m.  Visual Acuity Screening  Right eye Left eye Both eyes  Without correction:     With correction: 20/30 20/50      Patient is in no distress; well developed, nourished and groomed; neck is supple  CARDIOVASCULAR:  Examination of carotid arteries is normal; no carotid bruits  Regular rate and rhythm, no murmurs  Examination of peripheral vascular system by observati  on and palpation is normal  EYES:  Ophthalmoscopic exam of optic discs and posterior segments is normal; no papilledema or hemorrhages  MUSCULOSKELETAL:  Gait, strength, tone, movements noted in Neurologic exam below  NEUROLOGIC: MENTAL STATUS:  No flowsheet data found.  awake, alert, oriented to person, place and time  recent and remote memory intact  normal attention and concentration  language fluent, comprehension intact, naming intact,   fund of knowledge appropriate  CRANIAL NERVE:   2nd - no papilledema on fundoscopic exam  2nd, 3rd, 4th, 6th - pupils equal and reactive to light, visual fields full to confrontation, extraocular muscles intact, no nystagmus  5th - facial sensation symmetric  7th - facial strength symmetric  8th - hearing intact  9th - palate elevates symmetrically, uvula midline  11th - shoulder shrug symmetric  12th - tongue protrusion midline  MOTOR:   normal bulk and tone, full strength in the BUE, BLE  SENSORY:   normal and symmetric to light touch, temperature, vibration  COORDINATION:   finger-nose-finger, fine finger movements normal  REFLEXES:   deep tendon  reflexes present and symmetric  GAIT/STATION:   narrow based gait; able to walk tandem; romberg is negative    DIAGNOSTIC DATA (LABS, IMAGING, TESTING) - I reviewed patient records, labs, notes, testing and imaging myself where available.  Lab Results  Component Value Date   WBC 3.8 (L) 04/15/2018   HGB 13.9 04/15/2018   HCT 41.0 04/15/2018   MCV 94.6 04/15/2018   PLT 242 04/15/2018      Component Value Date/Time   NA 140 04/15/2018 1142   K 4.1 04/15/2018 1142   CL 103 04/15/2018 1142   CO2 26 04/15/2018 1137   GLUCOSE 98 04/15/2018 1142   BUN 9 04/15/2018 1142   CREATININE 0.80 04/15/2018 1142   CALCIUM 9.0 04/15/2018 1137   PROT 6.5 04/15/2018 1137   ALBUMIN 4.0 04/15/2018 1137   AST 23 04/15/2018 1137   ALT 13 (L) 04/15/2018 1137   ALKPHOS 61 04/15/2018 1137   BILITOT 0.7 04/15/2018 1137   GFRNONAA >60 04/15/2018 1137   GFRAA >60 04/15/2018 1137   Lab Results  Component Value Date   CHOL 217 (HH) 01/06/2008   HDL 37.4 (L) 01/06/2008   LDLDIRECT 168.7 01/06/2008   TRIG 79 01/06/2008   CHOLHDL 5.8 CALC 01/06/2008   No results found for: HGBA1C Lab Results  Component Value Date   VITAMINB12 111 (L) 02/17/2018   Lab Results  Component Value Date   TSH 2.40 02/17/2018   VITD  Date Value Ref Range Status  02/17/2018 17.59 (L) 30.00 - 100.00 ng/mL Final   Vit D, 25-Hydroxy  Date Value Ref Range Status  05/05/2012 44 30 - 89 ng/mL Final    Comment:    This assay accurately quantifies Vitamin D, which is the sum of the 25-Hydroxy forms of Vitamin D2 and D3.  Studies have shown that the optimum concentration of 25-Hydroxy Vitamin D is 30 ng/mL or higher.  Concentrations of Vitamin D between 20 and 29 ng/mL are considered to be insufficient and concentrations less than 20 ng/mL are considered  to be deficient for Vitamin D.  05/08/2010 39 30 - 89 ng/mL Final    Comment:    See lab report for associated comment(s)    04/15/18 MRI brain [I reviewed  images myself and agree with interpretation. -VRP]  - No acute or focal intracranial abnormality. Good general agreement with prior normal CT. - Minor foci of white matter signal abnormality, stable since 4496, uncertain, but likely doubtful, clinical significance.     ASSESSMENT AND PLAN  57 y.o. year old female here with history of migraine with aura, with worsening headaches since April 2019.  Neurologic examination and MRI of the brain are unremarkable.  Likely represents worsening of patient's existing migraine.  Patient has had several attacks associated with neurologic/strokelike symptoms, which may represent complicated migraine.  We will proceed with further treatment.   Dx: complicated migraine  1. Migraine with aura and without status migrainosus, not intractable   2. Complicated migraine      PLAN:  MIGRAINE PREVENTION - amitriptyline 25mg  at bedtime; may consider topiramate or propranolol in future  MIGRAINE RESCUE - rizatriptan 10mg  as needed for breakthrough headache; may repeat x 1 after 2 hours; max 2 tabs per day or 8 per month  Meds ordered this encounter  Medications  . amitriptyline (ELAVIL) 25 MG tablet    Sig: Take 1 tablet (25 mg total) by mouth at bedtime.    Dispense:  30 tablet    Refill:  6  . rizatriptan (MAXALT-MLT) 10 MG disintegrating tablet    Sig: Take 1 tablet (10 mg total) by mouth as needed for migraine. May repeat in 2 hours if needed    Dispense:  9 tablet    Refill:  6   Return in about 6 months (around 11/03/2018) for with NP or Penumalli.    Penni Bombard, MD 04/30/9162, 8:46 AM Certified in Neurology, Neurophysiology and Neuroimaging  [facility] [address], [place],  [address] [phone]

## 2018-05-03 NOTE — Telephone Encounter (Signed)
Prescription sent to patient's pharmacy until upcoming appt. 

## 2018-05-07 ENCOUNTER — Encounter: Payer: Self-pay | Admitting: Family Medicine

## 2018-05-07 ENCOUNTER — Telehealth: Payer: Self-pay | Admitting: Family Medicine

## 2018-05-07 ENCOUNTER — Ambulatory Visit: Payer: BLUE CROSS/BLUE SHIELD | Admitting: Family Medicine

## 2018-05-07 VITALS — BP 92/63 | HR 71 | Resp 16 | Ht 65.0 in | Wt 140.0 lb

## 2018-05-07 DIAGNOSIS — G43809 Other migraine, not intractable, without status migrainosus: Secondary | ICD-10-CM

## 2018-05-07 DIAGNOSIS — G8929 Other chronic pain: Secondary | ICD-10-CM

## 2018-05-07 DIAGNOSIS — M792 Neuralgia and neuritis, unspecified: Secondary | ICD-10-CM

## 2018-05-07 DIAGNOSIS — E538 Deficiency of other specified B group vitamins: Secondary | ICD-10-CM

## 2018-05-07 DIAGNOSIS — E559 Vitamin D deficiency, unspecified: Secondary | ICD-10-CM

## 2018-05-07 DIAGNOSIS — R51 Headache: Secondary | ICD-10-CM

## 2018-05-07 DIAGNOSIS — M5030 Other cervical disc degeneration, unspecified cervical region: Secondary | ICD-10-CM | POA: Diagnosis not present

## 2018-05-07 DIAGNOSIS — R519 Headache, unspecified: Secondary | ICD-10-CM

## 2018-05-07 LAB — VITAMIN D 25 HYDROXY (VIT D DEFICIENCY, FRACTURES): VITD: 34.77 ng/mL (ref 30.00–100.00)

## 2018-05-07 LAB — VITAMIN B12: VITAMIN B 12: 378 pg/mL (ref 211–911)

## 2018-05-07 MED ORDER — AMITRIPTYLINE HCL 10 MG PO TABS: 10.0000 mg | ORAL_TABLET | Freq: Every day | ORAL | 0 refills | Status: DC

## 2018-05-07 NOTE — Progress Notes (Signed)
Patient ID: Patricia Alvarez, female  DOB: 26-Aug-1961, 57 y.o.   MRN: [id number] Patient Care Team    Relationship Specialty Notifications Start End  Ma Hillock, DO PCP - General Family Medicine  02/17/18   Ladene Artist, MD Consulting Physician Gastroenterology  02/17/18   Teodoro Spray, OD  Optometry  02/17/18   Molli Posey, MD Consulting Physician Obstetrics and Gynecology  02/17/18     Chief Complaint  Patient presents with  . Follow-up    vit. D and vit. B12 deficiency    Subjective:  Patricia Alvarez is a 57 y.o.  female present for follow up on b12 and vit d level    Facial numbness/ intermittent tinnitus:   Long h/o chronic migraine headaches. She has seen neuro many years ago and had her new neuro appt this week.  Headaches reported to mostly affect left posterior auricular/occipital area. She was found to have b12 and vit d def after her last visit. She has completed her injections of b12 and her vit d 50000 u for 12 weeks (one left). She states she felt much better starting the supplements and magnesium. Neuro has also started on Elavil 25 mg. She just started but feels oversedated despite taking it at 8 pm. She has also been seen at ED since last visit when her attack hit, and they ruled out stroke/TIA and MS which is very relieved to here.  Prior note:  She is without symptoms today.  She reports onset of symptoms occurred approximately 2 years ago when she experienced a sharp deep pain in her left ear, eventually "moved to "to her posterior auricular area and  lasted less than 1 minute.  Since that time she has had intermittent moments where she has had a mixture of symptoms that occur, but not always present at the same time, of her tongue and lower lip being numb, increase in saliva where she feels that she drools from bilateral sides, left arm feels "cold "on the inside but no weakness that occurs a few times a week, left lower side of face will feel numb or  tingling, and intermittent ringing of the ears that is very rare and last less than 45 seconds.  She sustained an injury, "whiplash", in 1997 and had an x-ray in 2012 at a chiropractor office that was reviewed that showed degenerative disc disease and osteophyte formation.  December 05, 2015 she experienced a gradual onset with persistent left-sided face numbness, left ear pain, tip of her tongue being numb, left arm felt numb and cold and was seen in the emergency room.  A MRI was completed at that time showed a small region of abnormal T2 and FLAIR signal in the subcortical white matter of the right posterior frontal region. He has been prescribed Voltaren over the last year, however she states she rarely takes the medication and the tinnitus was not associated with the start of Voltaren.  She denies any hearing loss.Fhx hearing [place] mother and father  at age 28-80.  She has a family history of stroke.  She has a history of vitamin D and vitamin B12 deficiency.  She denies any visual changes, slurred speech, hearing impairment or gait impairment during her episodes.  She reports history of migraines that usually respond to Excedrin migraine if she is able to take it soon enough.  She states she usually has a rebound headache a day or 2 after using Excedrin Migraine.  Result Date: 04/15/2018 CLINICAL DATA:  Onset of headache yesterday, jaw tightness, focal neural deficit of less than 6 hours stroke suspected EXAM: CT HEAD WITHOUT CONTRAST TECHNIQUE: Contiguous axial images were obtained from the base of the skull through the vertex without intravenous contrast. Sagittal and coronal MPR images reconstructed from axial data set. COMPARISON:  None FINDINGS: Brain: Normal ventricular morphology. No midline shift or mass effect. Normal appearance of brain parenchyma. No intracranial hemorrhage, mass lesion, or evidence of acute infarction. No extra-axial fluid collections. Vascular: Unremarkable Skull: Intact  Sinuses/Orbits: Clear Other: N/A IMPRESSION: Normal exam. Electronically Signed   By: Lavonia Dana M.D.   On: 04/15/2018 12:20   Mr Brain Wo Contrast (neuro Protocol) Result Date: 04/15/2018  IMPRESSION: No acute or focal intracranial abnormality. Good general agreement with prior normal CT. Minor foci of white matter signal abnormality, stable since 8882, uncertain, but likely doubtful, clinical significance. Electronically Signed   By: Staci Righter M.D.   On: 04/15/2018 14:02   MRI  12/05/2015: IMPRESSION: No acute finding. 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   Depression screen [facility] 2/9 02/17/2018  Decreased Interest 0  Down, Depressed, Hopeless 0  PHQ - 2 Score 0   No flowsheet data found.        Fall Risk  02/17/2018  Falls in the past year? No     Immunization History  Administered Date(s) Administered  . Td 06/14/2001  . Tdap 04/27/2014    No exam data present  Past Medical History:  Diagnosis Date  . Allergic rhinitis   . Allergy   . B12 deficiency   . Back pain   . Chicken pox   . Colon polyp 2015   TUBULAR ADENOMA (X 1).  . DDD (degenerative disc disease), cervical   . Endometriosis of uterus   . GERD (gastroesophageal reflux disease)   . Hyperlipidemia   . IBS (irritable bowel syndrome)   . Internal hemorrhoid   . Migraines   . Rectal bleeding   . Vitamin D deficiency    Allergies  Allergen Reactions  . Codeine Itching and Nausea Only    REACTION: nausea  . Morphine Nausea Only    REACTION: nausea  . Prednisone     REACTION: Hyperactive   Past Surgical History:  Procedure Laterality Date  . APPENDECTOMY  02/2000   and ovary  . AUGMENTATION MAMMAPLASTY Bilateral 1998   Saline,retropectoral   . COLONOSCOPY W/ BIOPSIES  2015   multiple  . ESOPHAGOGASTRODUODENOSCOPY  2011   multiple  . LAPAROSCOPY    .  OOPHORECTOMY Left 02/2000   w/appendectomy  . PARTIAL HYSTERECTOMY  1994   right ovary and uterus   Family History  Problem Relation Age of Onset  . Hyperlipidemia Mother 56  . Heart failure Mother 74  . Arthritis Mother   . Asthma Mother   . Hearing loss Mother   . Heart disease Mother   . Hypertension Mother   . Miscarriages / [place] Mother   . Colon polyps Father   . Stroke Father   . Hearing loss Father   . Breast cancer Maternal Grandmother 22  . Colon cancer Neg Hx    Social History   Socioeconomic History  . Marital status: Married    Spouse name: Not on file  . Number of children: 2  . Years of education: 46  .  Highest education level: Not on file  Occupational History  . Occupation: Forensic psychologist: [employer]  . Financial resource strain: Not on file  . Food insecurity:    Worry: Not on file    Inability: Not on file  . Transportation needs:    Medical: Not on file    Non-medical: Not on file  Tobacco Use  . Smoking status: Never Smoker  . Smokeless tobacco: Never Used  Substance and Sexual Activity  . Alcohol use: No  . Drug use: No  . Sexual activity: Yes    Partners: Male    Birth control/protection: Surgical  Lifestyle  . Physical activity:    Days per week: Not on file    Minutes per session: Not on file  . Stress: Not on file  Relationships  . Social connections:    Talks on phone: Not on file    Gets together: Not on file    Attends religious service: Not on file    Active member of club or organization: Not on file    Attends meetings of clubs or organizations: Not on file    Relationship status: Not on file  . Intimate partner violence:    Fear of current or ex partner: Not on file    Emotionally abused: Not on file    Physically abused: Not on file    Forced sexual activity: Not on file  Other Topics Concern  . Not on file  Social History Narrative   Married. 2 children.    HS graduate.  Employed as a Herbalist.    Uses bicycle helmet, smoke alarm in the home.   Feels safe in her relationships.   Caffeine- unsweet 1 quart most days.    Allergies as of 05/07/2018      Reactions   Codeine Itching, Nausea Only   REACTION: nausea   Morphine Nausea Only   REACTION: nausea   Prednisone    REACTION: Hyperactive      Medication List        Accurate as of 05/07/18  8:59 AM. Always use your most recent med list.          amitriptyline 25 MG tablet Commonly known as:  ELAVIL Take 1 tablet (25 mg total) by mouth at bedtime.   amitriptyline 10 MG tablet Commonly known as:  ELAVIL Take 1 tablet (10 mg total) by mouth at bedtime.   cyanocobalamin 1000 MCG/ML injection Commonly known as:  (VITAMIN B-12) Inject 1 mL (1,000 mcg total) into the muscle every 30 (thirty) days.   diclofenac 75 MG EC tablet Commonly known as:  VOLTAREN Take 75 mg by mouth 2 (two) times daily as needed.   estradiol 0.5 MG tablet Commonly known as:  ESTRACE Take 0.5 mg by mouth daily.   omeprazole 40 MG capsule Commonly known as:  PRILOSEC Take 1 capsule (40 mg total) by mouth 2 (two) times daily.   rizatriptan 10 MG disintegrating tablet Commonly known as:  MAXALT-MLT Take 1 tablet (10 mg total) by mouth as needed for migraine. May repeat in 2 hours if needed   Vitamin D (Ergocalciferol) 50000 units Caps capsule Commonly known as:  DRISDOL Take 1 capsule (50,000 Units total) by mouth every 7 (seven) days.       All past medical history, surgical history, allergies, family history, immunizations andmedications were updated in the EMR today and reviewed under the history and medication portions of their EMR.  Recent Results (from the past 2160 hour(s))  B12     Status: Abnormal   Collection Time: 02/17/18  9:14 AM  Result Value Ref Range   Vitamin B-12 111 (L) 211 - 911 pg/mL  CBC w/Diff     Status: Abnormal   Collection Time: 02/17/18  9:14 AM  Result Value Ref Range    WBC 3.5 (L) 4.0 - 10.5 K/uL   RBC 4.25 3.87 - 5.11 Mil/uL   Hemoglobin 13.5 12.0 - 15.0 g/dL   HCT 40.2 36.0 - 46.0 %   MCV 94.6 78.0 - 100.0 fl   MCHC 33.5 30.0 - 36.0 g/dL   RDW 13.9 11.5 - 15.5 %   Platelets 262.0 150.0 - 400.0 K/uL   Neutrophils Relative % 52.6 43.0 - 77.0 %   Lymphocytes Relative 36.3 12.0 - 46.0 %   Monocytes Relative 5.9 3.0 - 12.0 %   Eosinophils Relative 3.5 0.0 - 5.0 %   Basophils Relative 1.7 0.0 - 3.0 %   Neutro Abs 1.9 1.4 - 7.7 K/uL   Lymphs Abs 1.3 0.7 - 4.0 K/uL   Monocytes Absolute 0.2 0.1 - 1.0 K/uL   Eosinophils Absolute 0.1 0.0 - 0.7 K/uL   Basophils Absolute 0.1 0.0 - 0.1 K/uL  VITAMIN D 25 Hydroxy (Vit-D Deficiency, Fractures)     Status: Abnormal   Collection Time: 02/17/18  9:14 AM  Result Value Ref Range   VITD 17.59 (L) 30.00 - 100.00 ng/mL  Comp Met (CMET)     Status: None   Collection Time: 02/17/18  9:14 AM  Result Value Ref Range   Sodium 139 135 - 145 mEq/L   Potassium 4.4 3.5 - 5.1 mEq/L   Chloride 105 96 - 112 mEq/L   CO2 31 19 - 32 mEq/L   Glucose, Bld 91 70 - 99 mg/dL   BUN 11 6 - 23 mg/dL   Creatinine, Ser 0.55 0.40 - 1.20 mg/dL   Total Bilirubin 0.4 0.2 - 1.2 mg/dL   Alkaline Phosphatase 57 39 - 117 U/L   AST 21 0 - 37 U/L   ALT 12 0 - 35 U/L   Total Protein 6.3 6.0 - 8.3 g/dL   Albumin 4.0 3.5 - 5.2 g/dL   Calcium 9.0 8.4 - 10.5 mg/dL   GFR 121.77 >60.00 mL/min  Magnesium     Status: None   Collection Time: 02/17/18  9:14 AM  Result Value Ref Range   Magnesium 1.9 1.5 - 2.5 mg/dL  TSH     Status: None   Collection Time: 02/17/18  9:14 AM  Result Value Ref Range   TSH 2.40 0.35 - 4.50 uIU/mL  Protime-INR     Status: None   Collection Time: 04/15/18 11:37 AM  Result Value Ref Range   Prothrombin Time 12.3 11.4 - 15.2 seconds   INR 0.92     Comment: Performed at [facility], [address], [place], [place] [address]  APTT     Status: None   Collection Time: 04/15/18 11:37 AM  Result Value Ref Range    aPTT 28 24 - 36 seconds    Comment: Performed at [facility] [address], [place] [address]  CBC     Status: Abnormal   Collection Time: 04/15/18 11:37 AM  Result Value Ref Range   WBC 3.8 (L) 4.0 - 10.5 K/uL   RBC 4.43 3.87 - 5.11 MIL/uL   Hemoglobin 13.8 12.0 - 15.0 g/dL  HCT 41.9 36.0 - 46.0 %   MCV 94.6 78.0 - 100.0 fL   MCH 31.2 26.0 - 34.0 pg   MCHC 32.9 30.0 - 36.0 g/dL   RDW 12.8 11.5 - 15.5 %   Platelets 242 150 - 400 K/uL    Comment: Performed at [facility], [place] [address] [place], [place] [address]  Differential     Status: None   Collection Time: 04/15/18 11:37 AM  Result Value Ref Range   Neutrophils Relative % 47 %   Neutro Abs 1.8 1.7 - 7.7 K/uL   Lymphocytes Relative 41 %   Lymphs Abs 1.6 0.7 - 4.0 K/uL   Monocytes Relative 7 %   Monocytes Absolute 0.3 0.1 - 1.0 K/uL   Eosinophils Relative 4 %   Eosinophils Absolute 0.2 0.0 - 0.7 K/uL   Basophils Relative 1 %   Basophils Absolute 0.0 0.0 - 0.1 K/uL   Immature Granulocytes 0 %   Abs Immature Granulocytes 0.0 0.0 - 0.1 K/uL    Comment: Performed at [facility], [place] [address] [place], [place] [address]  Comprehensive metabolic panel     Status: Abnormal   Collection Time: 04/15/18 11:37 AM  Result Value Ref Range   Sodium 138 135 - 145 mmol/L   Potassium 4.1 3.5 - 5.1 mmol/L   Chloride 106 101 - 111 mmol/L   CO2 26 22 - 32 mmol/L   Glucose, Bld 101 (H) 65 - 99 mg/dL   BUN 9 6 - 20 mg/dL   Creatinine, Ser 0.85 0.44 - 1.00 mg/dL   Calcium 9.0 8.9 - 10.3 mg/dL   Total Protein 6.5 6.5 - 8.1 g/dL   Albumin 4.0 3.5 - 5.0 g/dL   AST 23 15 - 41 U/L   ALT 13 (L) 14 - 54 U/L   Alkaline Phosphatase 61 38 - 126 U/L   Total Bilirubin 0.7 0.3 - 1.2 mg/dL   GFR calc non Af Amer >60 >60 mL/min   GFR calc Af Amer >60 >60 mL/min    Comment: (NOTE) The eGFR has been calculated using the CKD EPI equation. This calculation has not been validated in all clinical situations. eGFR's  persistently <60 mL/min signify possible Chronic Kidney Disease.    Anion gap 6 5 - 15    Comment: Performed at [facility] [address], [place],  [address]  I-stat troponin, ED     Status: None   Collection Time: 04/15/18 11:39 AM  Result Value Ref Range   Troponin i, poc 0.00 0.00 - 0.08 ng/mL   Comment 3            Comment: Due to the release kinetics of cTnI, a negative result within the first hours of the onset of symptoms does not rule out myocardial infarction with certainty. If myocardial infarction is still suspected, repeat the test at appropriate intervals.   I-Stat beta hCG blood, ED     Status: None   Collection Time: 04/15/18 11:41 AM  Result Value Ref Range   I-stat hCG, quantitative <5.0 <5 mIU/mL   Comment 3            Comment:   GEST. AGE      CONC.  (mIU/mL)   <=1 WEEK        5 - 50     2 WEEKS       50 - 500     3 WEEKS  100 - 10,000     4 WEEKS     1,000 - 30,000        FEMALE AND NON-PREGNANT FEMALE:     LESS THAN 5 mIU/mL   I-Stat Chem 8, ED     Status: None   Collection Time: 04/15/18 11:42 AM  Result Value Ref Range   Sodium 140 135 - 145 mmol/L   Potassium 4.1 3.5 - 5.1 mmol/L   Chloride 103 101 - 111 mmol/L   BUN 9 6 - 20 mg/dL   Creatinine, Ser 0.80 0.44 - 1.00 mg/dL   Glucose, Bld 98 65 - 99 mg/dL   Calcium, Ion 1.16 1.15 - 1.40 mmol/L   TCO2 24 22 - 32 mmol/L   Hemoglobin 13.9 12.0 - 15.0 g/dL   HCT 41.0 36.0 - 46.0 %    ROS: 14 pt review of systems performed and negative (unless mentioned in an HPI)  Objective: BP 92/63 (BP Location: Left Arm, Patient Position: Sitting, Cuff Size: Normal)   Pulse 71   Resp 16   Ht _0  (1.651 m)   Wt 140 lb (63.5 kg)   SpO2 96%   BMI 23.30 kg/m  Gen: Afebrile. No acute distress. Nontoxic. Appears well.  HENT: AT. [place].  MMM.  Eyes:Pupils Equal Round Reactive to light, Extraocular movements intact,  Conjunctiva without redness, discharge or icterus. CV: RRR no murmur, no  edema.  Chest: CTAB, no wheeze or crackles Abd: Soft. NTND. BS present. no Masses palpated.  MSK: normal. No focal deficits or obvious deformities.  Skin: no rashes, purpura or petechiae.  Neuro:  Normal gait. PERLA. EOMi. Alert. Oriented. Cranial nerves II through XII intact. Muscle strength 5/5 bilateral extremity. DTRs equal bilaterally. Psych: Normal affect, dress and demeanor. Normal speech. Normal thought content and judgment.   No exam data present   Assessment/plan: SHERON TALLMAN is a 57 y.o. female present for Establishment.  DDD (degenerative disc disease), cervical/Cervical radiculopathy/neuralgia/Other migraine without status migrainosus, not intractable/neuralgia/B12 deficiency/Hypoesthesia of skin/VIT D def - doing well. Recheck on vit d and b12 levels today.  - if levels normal, continue vit d 1000u d and B12 injections monthly (recheck q 6).  - Called in Elavil 10 mg QHS to allow her to taper to her neuro prescribed 25 mg QHS, since she is having morning sedation. If she finds even after tapering she needs the lower dose, she can call back in and I will be happy to supply the dose she requires (10 or 20)--> with follow up every 6 months.    Return if symptoms worsen or fail to improve.   Note is dictated utilizing voice recognition software. Although note has been proof read prior to signing, occasional typographical errors still can be missed. If any questions arise, please do not hesitate to call for verification.  Electronically signed by: Howard Pouch, DO [facility]

## 2018-05-07 NOTE — Telephone Encounter (Signed)
Please inform patient the following information: Her b12 is higher, but still not as high as I would have expected. - Continue b12 injections every 2 weeks x12 then follow up for retesting with the provider. She should also take a daily b12 1000 mcg orally  If not already.  - Her vit D is now normal. Continue daily supplement of at least 1000u.

## 2018-05-07 NOTE — Patient Instructions (Addendum)
Glad you are feeling better.  Called in the amitriptyline 10 mg take nightly--> taper to 20 mg (2 pills) on day 14. After additional week, try the 25 mg again provided by neuro.   If 25 mg is still too much, call in and we can refill the 10 mg for you.   Please help [place] help you:  We are honored you have chosen [facility] for your Primary Care home. Below you will find basic instructions that you may need to access in the future. Please help [place] help you by reading the instructions, which cover many of the frequent questions we experience.   Prescription refills and request:  -In order to allow more efficient response time, please call your pharmacy for all refills. They will forward the request electronically to [place]. This allows for the quickest possible response. Request left on a nurse line can take longer to refill, since these are checked as time allows between office patients and other phone calls.  - refill request can take up to 3-5 working days to complete.  - If request is sent electronically and request is appropiate, it is usually completed in 1-2 business days.  - all patients will need to be seen routinely for all chronic medical conditions requiring prescription medications (see follow-up below). If you are overdue for follow up on your condition, you will be asked to make an appointment and we will call in enough medication to cover you until your appointment (up to 30 days).  - all controlled substances will require a face to face visit to request/refill.  - if you desire your prescriptions to go through a new pharmacy, and have an active script at original pharmacy, you will need to call your pharmacy and have scripts transferred to new pharmacy. This is completed between the pharmacy locations and not by your provider.    Results: If any images or labs were ordered, it can take up to 1 week to get results depending on the test ordered and the lab/facility running and resulting  the test. - Normal or stable results, which do not need further discussion, may be released to your mychart immediately with attached note to you. A call may not be generated for normal results. Please make certain to sign up for mychart. If you have questions on how to activate your mychart you can call the front office.  - If your results need further discussion, our office will attempt to contact you via phone, and if unable to reach you after 2 attempts, we will release your abnormal result to your mychart with instructions.  - All results will be automatically released in mychart after 1 week.  - Your provider will provide you with explanation and instruction on all relevant material in your results. Please keep in mind, results and labs may appear confusing or abnormal to the untrained eye, but it does not mean they are actually abnormal for you personally. If you have any questions about your results that are not covered, or you desire more detailed explanation than what was provided, you should make an appointment with your provider to do so.   Our office handles many outgoing and incoming calls daily. If we have not contacted you within 1 week about your results, please check your mychart to see if there is a message first and if not, then contact our office.  In helping with this matter, you help decrease call volume, and therefore allow [place] to be able  to respond to patients needs more efficiently.   Acute office visits (sick visit):  An acute visit is intended for a new problem and are scheduled in shorter time slots to allow schedule openings for patients with new problems. This is the appropriate visit to discuss a new problem. Problems will not be addressed by phone call or Echart message. Appointment is needed if requesting treatment. In order to provide you with excellent quality medical care with proper time for you to explain your problem, have an exam and receive treatment with  instructions, these appointments should be limited to one new problem per visit. If you experience a new problem, in which you desire to be addressed, please make an acute office visit, we save openings on the schedule to accommodate you. Please do not save your new problem for any other type of visit, let us take care of it properly and quickly for you.   Follow up visits:  Depending on your condition(s) your provider will need to see you routinely in order to provide you with quality care and prescribe medication(s). Most chronic conditions (Example: hypertension, Diabetes, depression/anxiety... etc), require visits a couple times a year. Your provider will instruct you on proper follow up for your personal medical conditions and history. Please make certain to make follow up appointments for your condition as instructed. Failing to do so could result in lapse in your medication treatment/refills. If you request a refill, and are overdue to be seen on a condition, we will always provide you with a 30 day script (once) to allow you time to schedule.    Medicare wellness (well visit): - we have a wonderful Nurse Maudie Mercury), that will meet with you and provide you will yearly medicare wellness visits. These visits should occur yearly (can not be scheduled less than 1 calendar year apart) and cover preventive health, immunizations, advance directives and screenings you are entitled to yearly through your medicare benefits. Do not miss out on your entitled benefits, this is when medicare will pay for these benefits to be ordered for you.  These are strongly encouraged by your provider and is the appropriate type of visit to make certain you are up to date with all preventive health benefits. If you have not had your medicare wellness exam in the last 12 months, please make certain to schedule one by calling the office and schedule your medicare wellness with Maudie Mercury as soon as possible.   Yearly physical (well visit):   - Adults are recommended to be seen yearly for physicals. Check with your insurance and date of your last physical, most insurances require one calendar year between physicals. Physicals include all preventive health topics, screenings, medical exam and labs that are appropriate for gender/age and history. You may have fasting labs needed at this visit. This is a well visit (not a sick visit), new problems should not be covered during this visit (see acute visit).  - Pediatric patients are seen more frequently when they are younger. Your provider will advise you on well child visit timing that is appropriate for your their age. - This is not a medicare wellness visit. Medicare wellness exams do not have an exam portion to the visit. Some medicare companies allow for a physical, some do not allow a yearly physical. If your medicare allows a yearly physical you can schedule the medicare wellness with our nurse Maudie Mercury and have your physical with your provider after, on the same day. Please check with insurance for  your full benefits.   Late Policy/No Shows:  - all new patients should arrive 15-30 minutes earlier than appointment to allow [place] time  to  obtain all personal demographics,  insurance information and for you to complete office paperwork. - All established patients should arrive 10-15 minutes earlier than appointment time to update all information and be checked in .  - In our best efforts to run on time, if you are late for your appointment you will be asked to either reschedule or if able, we will work you back into the schedule. There will be a wait time to work you back in the schedule,  depending on availability.  - If you are unable to make it to your appointment as scheduled, please call 24 hours ahead of time to allow [place] to fill the time slot with someone else who needs to be seen. If you do not cancel your appointment ahead of time, you may be charged a no show fee.

## 2018-05-10 NOTE — Telephone Encounter (Signed)
Patient notified and verbalized understanding. 

## 2018-05-13 ENCOUNTER — Ambulatory Visit: Payer: BLUE CROSS/BLUE SHIELD

## 2018-05-17 ENCOUNTER — Telehealth: Payer: Self-pay

## 2018-05-17 NOTE — Telephone Encounter (Signed)
Patient called back and she is not taking amitriptyline. Call back [phone]

## 2018-05-17 NOTE — Telephone Encounter (Signed)
Message left on voice mail for patient to return call. Strength needed to be verified on Amitriptyline. 10 or 20 mg.

## 2018-05-18 ENCOUNTER — Ambulatory Visit (INDEPENDENT_AMBULATORY_CARE_PROVIDER_SITE_OTHER): Payer: BLUE CROSS/BLUE SHIELD

## 2018-05-18 DIAGNOSIS — E538 Deficiency of other specified B group vitamins: Secondary | ICD-10-CM | POA: Diagnosis not present

## 2018-05-18 DIAGNOSIS — Z13820 Encounter for screening for osteoporosis: Secondary | ICD-10-CM | POA: Diagnosis not present

## 2018-05-18 MED ORDER — CYANOCOBALAMIN 1000 MCG/ML IJ SOLN
1000.0000 ug | Freq: Once | INTRAMUSCULAR | Status: AC
  Administered 2018-05-18: 1000 ug via INTRAMUSCULAR

## 2018-05-18 NOTE — Progress Notes (Addendum)
Patricia Alvarez is a 57 y.o. female presents to the office today for Vitamin B12 injection, per physician's orders. Original order: 05/07/18, 1 of 12 every two weeks.  Vitamin B12 ,51ml, IM was administered left deltoid today. Patient tolerated injection. Patient due for follow up labs/provider appt: after injection #12. Date due: , appt made No Patient next injection due: June 01, 2018, appt made Yes  [place] screening examination/treatment/procedure(s) were performed by non-physician practitioner and as supervising physician I was immediately available for consultation/collaboration.  I agree with above assessment and plan.  Electronically Signed by: Howard Pouch, DO [place] [facility] [place]

## 2018-05-20 ENCOUNTER — Ambulatory Visit
Admission: RE | Admit: 2018-05-20 | Discharge: 2018-05-20 | Disposition: A | Discharge status: Home / Self Care | Payer: BLUE CROSS/BLUE SHIELD | Source: Ambulatory Visit | Attending: Obstetrics and Gynecology | Admitting: Obstetrics and Gynecology

## 2018-05-20 DIAGNOSIS — Z1231 Encounter for screening mammogram for malignant neoplasm of breast: Secondary | ICD-10-CM | POA: Diagnosis not present

## 2018-05-21 ENCOUNTER — Other Ambulatory Visit: Payer: Self-pay | Admitting: Obstetrics and Gynecology

## 2018-05-21 DIAGNOSIS — R928 Other abnormal and inconclusive findings on diagnostic imaging of breast: Secondary | ICD-10-CM

## 2018-05-25 DIAGNOSIS — M47816 Spondylosis without myelopathy or radiculopathy, lumbar region: Secondary | ICD-10-CM | POA: Diagnosis not present

## 2018-05-25 DIAGNOSIS — M5136 Other intervertebral disc degeneration, lumbar region: Secondary | ICD-10-CM | POA: Diagnosis not present

## 2018-05-25 DIAGNOSIS — M7062 Trochanteric bursitis, left hip: Secondary | ICD-10-CM | POA: Diagnosis not present

## 2018-05-25 DIAGNOSIS — M7061 Trochanteric bursitis, right hip: Secondary | ICD-10-CM | POA: Diagnosis not present

## 2018-05-26 ENCOUNTER — Other Ambulatory Visit: Payer: Self-pay | Admitting: Gastroenterology

## 2018-05-27 ENCOUNTER — Ambulatory Visit: Payer: BLUE CROSS/BLUE SHIELD

## 2018-05-27 ENCOUNTER — Other Ambulatory Visit: Payer: Self-pay | Admitting: Obstetrics and Gynecology

## 2018-05-27 ENCOUNTER — Ambulatory Visit
Admission: RE | Admit: 2018-05-27 | Discharge: 2018-05-27 | Disposition: A | Discharge status: Home / Self Care | Payer: BLUE CROSS/BLUE SHIELD | Source: Ambulatory Visit | Attending: Obstetrics and Gynecology | Admitting: Obstetrics and Gynecology

## 2018-05-27 DIAGNOSIS — R928 Other abnormal and inconclusive findings on diagnostic imaging of breast: Secondary | ICD-10-CM

## 2018-05-30 ENCOUNTER — Other Ambulatory Visit: Payer: Self-pay | Admitting: Gastroenterology

## 2018-05-31 ENCOUNTER — Ambulatory Visit: Payer: BLUE CROSS/BLUE SHIELD

## 2018-06-01 ENCOUNTER — Ambulatory Visit: Payer: BLUE CROSS/BLUE SHIELD

## 2018-06-02 DIAGNOSIS — M25551 Pain in right hip: Secondary | ICD-10-CM | POA: Diagnosis not present

## 2018-06-02 DIAGNOSIS — M1611 Unilateral primary osteoarthritis, right hip: Secondary | ICD-10-CM | POA: Diagnosis not present

## 2018-06-03 ENCOUNTER — Ambulatory Visit (INDEPENDENT_AMBULATORY_CARE_PROVIDER_SITE_OTHER): Payer: BLUE CROSS/BLUE SHIELD

## 2018-06-03 DIAGNOSIS — E538 Deficiency of other specified B group vitamins: Secondary | ICD-10-CM

## 2018-06-03 MED ORDER — CYANOCOBALAMIN 1000 MCG/ML IJ SOLN
1000.0000 ug | Freq: Once | INTRAMUSCULAR | Status: AC
  Administered 2018-06-03: 1000 ug via INTRAMUSCULAR

## 2018-06-03 NOTE — Progress Notes (Signed)
Patient presents today for Vitamin B12 injection 1 ml IM. 2 of 12 every 2 weeks. Left deltoid. Patient tolerated injection well.  Patient due for follow up after injection #12, Patient next injection due: June 16, 2018, appt made.

## 2018-06-10 NOTE — Progress Notes (Signed)
Pt with vit B12 deficiency.  Agree with vit B12 1000 mcg IM in office today. Signed:  Crissie Sickles, MD           06/10/2018

## 2018-06-17 ENCOUNTER — Ambulatory Visit (INDEPENDENT_AMBULATORY_CARE_PROVIDER_SITE_OTHER): Payer: BLUE CROSS/BLUE SHIELD | Admitting: Family Medicine

## 2018-06-17 DIAGNOSIS — E538 Deficiency of other specified B group vitamins: Secondary | ICD-10-CM

## 2018-06-17 MED ORDER — CYANOCOBALAMIN 1000 MCG/ML IJ SOLN
1000.0000 ug | Freq: Once | INTRAMUSCULAR | Status: AC
  Administered 2018-06-17: 1000 ug via INTRAMUSCULAR

## 2018-06-17 NOTE — Progress Notes (Signed)
Pt with vit B12 deficiency.  Agree with vit B12 1000 mcg IM in office today. Signed:  Crissie Sickles, MD           06/17/2018

## 2018-06-17 NOTE — Progress Notes (Signed)
Patricia Alvarez is a 57 y.o. female presents to the office today for B12  injection, per physician's orders.  Vitamin B12, 1000 mcg, IM was administered right deltoid today. Patient tolerated injection well.  Patient next injection due: July 01, 2018, appt made Yes

## 2018-06-24 DIAGNOSIS — M25551 Pain in right hip: Secondary | ICD-10-CM | POA: Diagnosis not present

## 2018-06-24 DIAGNOSIS — M1611 Unilateral primary osteoarthritis, right hip: Secondary | ICD-10-CM | POA: Diagnosis not present

## 2018-07-01 ENCOUNTER — Ambulatory Visit (INDEPENDENT_AMBULATORY_CARE_PROVIDER_SITE_OTHER): Payer: BLUE CROSS/BLUE SHIELD

## 2018-07-01 DIAGNOSIS — M1631 Unilateral osteoarthritis resulting from hip dysplasia, right hip: Secondary | ICD-10-CM | POA: Diagnosis not present

## 2018-07-01 DIAGNOSIS — E538 Deficiency of other specified B group vitamins: Secondary | ICD-10-CM

## 2018-07-01 MED ORDER — CYANOCOBALAMIN 1000 MCG/ML IJ SOLN
1000.0000 ug | Freq: Once | INTRAMUSCULAR | Status: AC
  Administered 2018-07-01: 1000 ug via INTRAMUSCULAR

## 2018-07-01 NOTE — Progress Notes (Addendum)
Patricia Alvarez is a 57 y.o. female presents to the office today for Vitamin B 12 injections, per physician's orders. Original order: # 4 of #12 Vitamin  B12 , 0.64ml, IM was administered Left Deltoid today. Patient tolerated injection. Patient due for follow up labs/provider appt: Marland Kitchen Date due: , appt made  Patient next injection due: 07/15/18, appt made Yes  [place] screening examination/treatment/procedure(s) were performed by non-physician practitioner and as supervising physician I was immediately available for consultation/collaboration.  I agree with above assessment and plan.  Electronically Signed by: Howard Pouch, DO [place] [facility] [place]

## 2018-07-02 ENCOUNTER — Ambulatory Visit: Payer: BLUE CROSS/BLUE SHIELD | Admitting: Gastroenterology

## 2018-07-06 ENCOUNTER — Telehealth: Payer: Self-pay | Admitting: *Deleted

## 2018-07-06 NOTE — Telephone Encounter (Signed)
Copied from [place] [id number]. Topic: General - Other >> Jul 06, 2018 11:17 AM Keene Breath wrote: Reason for CRM: Patient called to inform the doctor that she will be having surgery on 08/31/18 and a medical clearance form is being faxed over to the office.  Patient would like to speak with the nurse regarding this form.  CB# [phone].

## 2018-07-06 NOTE — Telephone Encounter (Signed)
Spoke with patient scheduled an appointment for surgical clearance for hip replacement.

## 2018-07-18 ENCOUNTER — Other Ambulatory Visit: Payer: Self-pay | Admitting: Gastroenterology

## 2018-07-20 ENCOUNTER — Ambulatory Visit: Payer: BLUE CROSS/BLUE SHIELD

## 2018-07-22 ENCOUNTER — Ambulatory Visit (INDEPENDENT_AMBULATORY_CARE_PROVIDER_SITE_OTHER): Payer: BLUE CROSS/BLUE SHIELD | Admitting: *Deleted

## 2018-07-22 DIAGNOSIS — E538 Deficiency of other specified B group vitamins: Secondary | ICD-10-CM | POA: Diagnosis not present

## 2018-07-22 MED ORDER — CYANOCOBALAMIN 1000 MCG/ML IJ SOLN
1000.0000 ug | Freq: Once | INTRAMUSCULAR | Status: AC
  Administered 2018-07-22: 1000 ug via INTRAMUSCULAR

## 2018-07-22 NOTE — Progress Notes (Signed)
Patricia Alvarez is a 56 y.o. female presents to the office today for B 12 injection , per physician's orders. Original order: 05/07/18 B 12 1029mcg/ml injection 5 of 12,   was administered Im right deltoid  today. Patient tolerated injection.  Patient next injection due: 08/04/18, appt made yes  [name]

## 2018-07-23 ENCOUNTER — Ambulatory Visit: Payer: BLUE CROSS/BLUE SHIELD | Admitting: Family Medicine

## 2018-07-26 NOTE — Progress Notes (Signed)
Pt with vit B12 deficiency.  Agree with vit B12 1000 mcg IM in office today. Signed:  Crissie Sickles, MD           07/26/2018

## 2018-07-27 ENCOUNTER — Ambulatory Visit: Payer: BLUE CROSS/BLUE SHIELD | Admitting: Family Medicine

## 2018-07-28 ENCOUNTER — Ambulatory Visit: Payer: BLUE CROSS/BLUE SHIELD | Admitting: Family Medicine

## 2018-07-28 ENCOUNTER — Encounter: Payer: Self-pay | Admitting: Family Medicine

## 2018-07-28 VITALS — BP 106/71 | HR 67 | Temp 98.1°F | Resp 20 | Ht 65.0 in | Wt 140.0 lb

## 2018-07-28 DIAGNOSIS — Z23 Encounter for immunization: Secondary | ICD-10-CM | POA: Diagnosis not present

## 2018-07-28 DIAGNOSIS — Z01818 Encounter for other preprocedural examination: Secondary | ICD-10-CM | POA: Diagnosis not present

## 2018-07-28 DIAGNOSIS — Z9889 Other specified postprocedural states: Secondary | ICD-10-CM

## 2018-07-28 DIAGNOSIS — M25551 Pain in right hip: Secondary | ICD-10-CM

## 2018-07-28 DIAGNOSIS — Z79899 Other long term (current) drug therapy: Secondary | ICD-10-CM

## 2018-07-28 DIAGNOSIS — M1610 Unilateral primary osteoarthritis, unspecified hip: Secondary | ICD-10-CM | POA: Diagnosis not present

## 2018-07-28 DIAGNOSIS — R112 Nausea with vomiting, unspecified: Secondary | ICD-10-CM

## 2018-07-28 NOTE — Patient Instructions (Signed)
Remember to ask about zofran (nausea med) and when to stop the diclofenac (usually 2 weeks or more before surgery).  Good luck!!!

## 2018-07-28 NOTE — Progress Notes (Signed)
Patricia Alvarez , 1961/04/13, 57 y.o., female MRN: [id number] Patient Care Team    Relationship Specialty Notifications Start End  Ma Hillock, DO PCP - General Family Medicine  02/17/18   Ladene Artist, MD Consulting Physician Gastroenterology  02/17/18   Teodoro Spray, [place]  Optometry  02/17/18   Molli Posey, MD Consulting Physician Obstetrics and Gynecology  02/17/18     Chief Complaint  Patient presents with  . surgical clearance     Subjective:  Procedure: right total hip - anterior Indication: pain, arthritis  Anesthesia: "choice" Surgery type risk:   - Intermediate risk= orthopedics Prior anesthesia complications: post-op nausea and vomit Family history of prior anesthesia complications:none Cardiac:    - CBD, PAD, stroke, MI, aortic stenosis --> none   - METs: > 10 METS   - EKG: 04/15/2018- NSR.  Pulmonary: No Endocrine: no  Obesity:Body mass index is 23.3 kg/m. Chronic kidney disease: no Chronic med that needs to be continued: No Anticoagulation: no, on daily NSAIDS  . Depression screen [facility] 2/9 07/28/2018 02/17/2018  Decreased Interest 0 0  Down, Depressed, Hopeless 0 0  PHQ - 2 Score 0 0    Allergies  Allergen Reactions  . Codeine Itching and Nausea Only    REACTION: nausea  . Morphine Nausea Only    REACTION: nausea  . Prednisone     REACTION: Hyperactive   Social History   Tobacco Use  . Smoking status: Never Smoker  . Smokeless tobacco: Never Used  Substance Use Topics  . Alcohol use: No   Past Medical History:  Diagnosis Date  . Allergic rhinitis   . Allergy   . B12 deficiency   . Back pain   . Chicken pox   . Colon polyp 2015   TUBULAR ADENOMA (X 1).  . DDD (degenerative disc disease), cervical   . Endometriosis of uterus   . GERD (gastroesophageal reflux disease)   . Hyperlipidemia   . IBS (irritable bowel syndrome)   . Internal hemorrhoid   . Migraines   . Rectal bleeding   . Vitamin D deficiency    Past Surgical  History:  Procedure Laterality Date  . APPENDECTOMY  02/2000   and ovary  . AUGMENTATION MAMMAPLASTY Bilateral 1998   Saline,retropectoral   . COLONOSCOPY W/ BIOPSIES  2015   multiple  . ESOPHAGOGASTRODUODENOSCOPY  2011   multiple  . LAPAROSCOPY    . OOPHORECTOMY Left 02/2000   w/appendectomy  . PARTIAL HYSTERECTOMY  1994   right ovary and uterus   Family History  Problem Relation Age of Onset  . Hyperlipidemia Mother 59  . Heart failure Mother 26  . Arthritis Mother   . Asthma Mother   . Hearing loss Mother   . Heart disease Mother   . Hypertension Mother   . Miscarriages / [place] Mother   . Colon polyps Father   . Stroke Father   . Hearing loss Father   . Breast cancer Maternal Grandmother 16  . Colon cancer Neg Hx    Allergies as of 07/28/2018      Reactions   Codeine Itching, Nausea Only   REACTION: nausea   Morphine Nausea Only   REACTION: nausea   Prednisone    REACTION: Hyperactive      Medication List        Accurate as of 07/28/18  3:46 PM. Always use your most recent med list.          cyanocobalamin 1000  MCG/ML injection Commonly known as:  (VITAMIN B-12) Inject 1 mL (1,000 mcg total) into the muscle every 30 (thirty) days.   diclofenac 75 MG EC tablet Commonly known as:  VOLTAREN Take 75 mg by mouth daily.   estradiol 0.5 MG tablet Commonly known as:  ESTRACE Take 0.5 mg by mouth daily.   omeprazole 40 MG capsule Commonly known as:  PRILOSEC TAKE 1 CAPSULE BY MOUTH TWICE A DAY   rizatriptan 10 MG disintegrating tablet Commonly known as:  MAXALT-MLT Take 1 tablet (10 mg total) by mouth as needed for migraine. May repeat in 2 hours if needed       All past medical history, surgical history, allergies, family history, immunizations andmedications were updated in the EMR today and reviewed under the history and medication portions of their EMR.     ROS: Negative, with the exception of above mentioned in HPI   Objective:  BP  106/71 (BP Location: Right Arm, Patient Position: Sitting, Cuff Size: Normal)   Pulse 67   Temp 98.1 F (36.7 C)   Resp 20   Ht '5\' 5"'$  (1.651 m)   Wt 140 lb (63.5 kg)   SpO2 97%   BMI 23.30 kg/m  Body mass index is 23.3 kg/m. Gen: Afebrile. No acute distress. Nontoxic in appearance, well developed, well nourished.  HENT: AT. [place]. Bilateral TM visualized with out erythema or fullness. MMM, no oral lesions. Bilateral nares without erythema, swelling or drainage. Throat without erythema or exudates. No cough or hoarseness Eyes:Pupils Equal Round Reactive to light, Extraocular movements intact,  Conjunctiva without redness, discharge or icterus. Neck/lymp/endocrine: Supple,no lymphadenopathy, no thyromegaly CV: RRR no murmur, no edema Chest: CTAB, no wheeze or crackles. Good air movement, normal resp effort.  Abd: Soft. flat. NTND. BS present. no Masses palpated. No rebound or guarding.  Skin: no rashes, purpura or petechiae.  Neuro:  Normal gait. PERLA. EOMi. Alert. Oriented x3  Psych: Normal affect, dress and demeanor. Normal speech. Normal thought content and judgment.  No exam data present No results found. No results found for this or any previous visit (from the past 24 hour(s)).  Assessment/Plan: Patricia Alvarez is a 57 y.o. female present for OV for  Pre-op evaluation/Right hip pain/Hip arthritis - Pt is  low risk for complications with procedure. Cleared for surgery.  - EKG NSR  03/2018 - Urinalysis, Routine w reflex microscopic - HgB A1c: WNL 07/28/2018 - Comp Met (CMET): WNL 07/28/2018 - CBC w/Diff: WNL 07/28/2018  Encounter for long-term (current) use of medications - discussed stopping diclofenac at least 14 days prior - Comp Met (CMET) - CBC w/Diff  Post-operative nausea and vomiting Encouraged her to inform anes. and surgeon, so she can be provided with post-op antinausea.   Immunization due - Flu Vaccine QUAD 6+ mos PF IM (Fluarix Quad PF)  No patient is free of  risk when undergoing a procedure. The decision about whether to proceed with the operation belongs to the surgeon and the patient   Reviewed expectations re: course of current medical issues.  Discussed self-management of symptoms.  Outlined signs and symptoms indicating need for more acute intervention.  Patient verbalized understanding and all questions were answered.  Patient received an After-Visit Summary.   Orders Placed This Encounter  Procedures  . Urinalysis, Routine w reflex microscopic  . HgB A1c  . Comp Met (CMET)  . CBC w/Diff   > 25 minutes spent with patient, >50% of time spent face to face counseling and  coordinating care.    Note is dictated utilizing voice recognition software. Although note has been proof read prior to signing, occasional typographical errors still can be missed. If any questions arise, please do not hesitate to call for verification.   electronically signed by:  Howard Pouch, DO  [facility]

## 2018-07-29 LAB — COMPREHENSIVE METABOLIC PANEL
ALBUMIN: 4 g/dL (ref 3.5–5.2)
ALK PHOS: 53 U/L (ref 39–117)
ALT: 11 U/L (ref 0–35)
AST: 19 U/L (ref 0–37)
BILIRUBIN TOTAL: 0.3 mg/dL (ref 0.2–1.2)
BUN: 12 mg/dL (ref 6–23)
CO2: 32 mEq/L (ref 19–32)
CREATININE: 0.68 mg/dL (ref 0.40–1.20)
Calcium: 9.3 mg/dL (ref 8.4–10.5)
Chloride: 102 mEq/L (ref 96–112)
GFR: 95.17 mL/min (ref >60.00)
Glucose, Bld: 87 mg/dL (ref 70–99)
POTASSIUM: 3.9 meq/L (ref 3.5–5.1)
SODIUM: 138 meq/L (ref 135–145)
TOTAL PROTEIN: 6.3 g/dL (ref 6.0–8.3)

## 2018-07-29 LAB — CBC WITH DIFFERENTIAL/PLATELET
BASOS ABS: 0.1 10*3/uL (ref 0.0–0.1)
Basophils Relative: 1 % (ref 0.0–3.0)
Eosinophils Absolute: 0.1 10*3/uL (ref 0.0–0.7)
Eosinophils Relative: 1.2 % (ref 0.0–5.0)
HCT: 39.9 % (ref 36.0–46.0)
HEMOGLOBIN: 13.4 g/dL (ref 12.0–15.0)
Lymphocytes Relative: 44.3 % (ref 12.0–46.0)
Lymphs Abs: 2.5 10*3/uL (ref 0.7–4.0)
MCHC: 33.6 g/dL (ref 30.0–36.0)
MCV: 95.6 fl (ref 78.0–100.0)
MONO ABS: 0.3 10*3/uL (ref 0.1–1.0)
Monocytes Relative: 4.8 % (ref 3.0–12.0)
Neutro Abs: 2.8 10*3/uL (ref 1.4–7.7)
Neutrophils Relative %: 48.7 % (ref 43.0–77.0)
Platelets: 268 10*3/uL (ref 150.0–400.0)
RBC: 4.17 Mil/uL (ref 3.87–5.11)
RDW: 15.1 % (ref 11.5–15.5)
WBC: 5.7 10*3/uL (ref 4.0–10.5)

## 2018-07-29 LAB — HEMOGLOBIN A1C: HEMOGLOBIN A1C: 5.4 % (ref 4.6–6.5)

## 2018-07-29 LAB — URINALYSIS, ROUTINE W REFLEX MICROSCOPIC
Bilirubin Urine: NEGATIVE
Glucose, UA: NEGATIVE
Hgb urine dipstick: NEGATIVE
KETONES UR: NEGATIVE
LEUKOCYTES UA: NEGATIVE
NITRITE: NEGATIVE
PH: 7 (ref 5.0–8.0)
Protein, ur: NEGATIVE
Specific Gravity, Urine: 1.012 (ref 1.001–1.03)

## 2018-08-02 ENCOUNTER — Encounter: Payer: Self-pay | Admitting: Gastroenterology

## 2018-08-02 ENCOUNTER — Encounter

## 2018-08-02 ENCOUNTER — Ambulatory Visit (INDEPENDENT_AMBULATORY_CARE_PROVIDER_SITE_OTHER): Payer: BLUE CROSS/BLUE SHIELD | Admitting: Gastroenterology

## 2018-08-02 VITALS — BP 108/70 | HR 66 | Ht 65.0 in | Wt 140.4 lb

## 2018-08-02 DIAGNOSIS — K219 Gastro-esophageal reflux disease without esophagitis: Secondary | ICD-10-CM

## 2018-08-02 DIAGNOSIS — Z86010 Personal history of colonic polyps: Secondary | ICD-10-CM

## 2018-08-02 MED ORDER — OMEPRAZOLE 40 MG PO CPDR: 40.0000 mg | DELAYED_RELEASE_CAPSULE | Freq: Every day | ORAL | 11 refills | Status: DC

## 2018-08-02 NOTE — Progress Notes (Signed)
    History of Present Illness: This is a 57 year old female returning for follow-up of GERD.  She states her symptoms are generally under very good control on omeprazole daily.  She has infrequent breakthrough symptoms and takes Tums as needed.  She has right hip replacement surgery scheduled in November.  No other GI complaints.  Current Medications, Allergies, Past Medical History, Past Surgical History, Family History and Social History were reviewed in [employer] record.  Physical Exam: General: Well developed, well nourished, no acute distress Head: Normocephalic and atraumatic Eyes:  sclerae anicteric, EOMI Ears: Normal auditory acuity Mouth: No deformity or lesions Lungs: Clear throughout to auscultation Heart: Regular rate and rhythm; no murmurs, rubs or bruits Abdomen: Soft, non tender and non distended. No masses, hepatosplenomegaly or hernias noted. Normal Bowel sounds Rectal: Not done Musculoskeletal: Symmetrical with no gross deformities  Pulses:  Normal pulses noted Extremities: No clubbing, cyanosis, edema or deformities noted Neurological: Alert oriented x 4, grossly nonfocal Psychological:  Alert and cooperative. Normal mood and affect  Assessment and Recommendations:  1.  GERD.  Continue omeprazole 40 mg p.o. every morning and follow standard antireflux measures.  Tums as needed.  2.  Personal history of adenomatous colon polyps.  A 5-year interval surveillance colonoscopy is due in January 2020.

## 2018-08-02 NOTE — Patient Instructions (Signed)
We have sent the following medications to your pharmacy for you to pick up at your convenience: omeprazole.   Normal BMI (Body Mass Index- based on height and weight) is between 19 and 25. Your BMI today is Body mass index is 23.36 kg/m. Marland Kitchen Please consider follow up  regarding your BMI with your Primary Care Provider.  Thank you for choosing me and [facility].  Pricilla Riffle. Dagoberto Ligas., MD., [facility]

## 2018-08-04 DIAGNOSIS — M1611 Unilateral primary osteoarthritis, right hip: Secondary | ICD-10-CM | POA: Diagnosis not present

## 2018-08-05 ENCOUNTER — Ambulatory Visit (INDEPENDENT_AMBULATORY_CARE_PROVIDER_SITE_OTHER): Payer: BLUE CROSS/BLUE SHIELD

## 2018-08-05 DIAGNOSIS — E538 Deficiency of other specified B group vitamins: Secondary | ICD-10-CM | POA: Diagnosis not present

## 2018-08-05 MED ORDER — CYANOCOBALAMIN 1000 MCG/ML IJ SOLN
1000.0000 ug | Freq: Once | INTRAMUSCULAR | Status: AC
  Administered 2018-08-05: 1000 ug via INTRAMUSCULAR

## 2018-08-05 NOTE — Progress Notes (Addendum)
Patricia Alvarez is a 57 y.o. female presents to the office today for B 12 injection , per physician's orders. Original order: 05/07/18 B 12 1025mcg/ml injection 6 of 12,   was administered Im lefdt deltoid  today. Patient tolerated injection.  Patient next injection due: 08/19/18, appt made yes  Medical screening examination/treatment/procedure(s) were performed by non-physician practitioner and as supervising physician I was immediately available for consultation/collaboration.  I agree with above assessment and plan.  Electronically Signed by: Howard Pouch, DO [place] [facility] [place]

## 2018-08-10 ENCOUNTER — Other Ambulatory Visit: Payer: Self-pay | Admitting: Gastroenterology

## 2018-08-14 ENCOUNTER — Other Ambulatory Visit: Payer: Self-pay | Admitting: Gastroenterology

## 2018-08-20 ENCOUNTER — Ambulatory Visit (INDEPENDENT_AMBULATORY_CARE_PROVIDER_SITE_OTHER): Payer: BLUE CROSS/BLUE SHIELD | Admitting: Family Medicine

## 2018-08-20 DIAGNOSIS — E538 Deficiency of other specified B group vitamins: Secondary | ICD-10-CM | POA: Diagnosis not present

## 2018-08-20 MED ORDER — CYANOCOBALAMIN 1000 MCG/ML IJ SOLN
1000.0000 ug | Freq: Once | INTRAMUSCULAR | Status: AC
  Administered 2018-08-20: 1000 ug via INTRAMUSCULAR

## 2018-08-20 NOTE — Progress Notes (Addendum)
Patricia Puthoff Brownis a 57 y.o.femalepresents to the office today for B 12 injection per physician's orders. Original order:05/07/18 Vitamin B12 1090mcg/ml injection 7 of 12, was administered IM leftt deltoidtoday. Patient tolerated injection well.  *patient wanted me to note that she has hip surgery on 08/31/18 and may be off schedule with her next B12 injection. Medical screening   examination/treatment/procedure(s) were performed by non-physician practitioner and as supervising physician I was immediately available for consultation/collaboration.  I agree with above assessment and plan.  Electronically Signed by: Howard Pouch, DO [place] [facility] [place]

## 2018-08-24 ENCOUNTER — Ambulatory Visit (INDEPENDENT_AMBULATORY_CARE_PROVIDER_SITE_OTHER): Payer: BLUE CROSS/BLUE SHIELD | Admitting: Podiatry

## 2018-08-24 ENCOUNTER — Ambulatory Visit (INDEPENDENT_AMBULATORY_CARE_PROVIDER_SITE_OTHER): Payer: BLUE CROSS/BLUE SHIELD

## 2018-08-24 VITALS — BP 98/69 | HR 71

## 2018-08-24 DIAGNOSIS — M2012 Hallux valgus (acquired), left foot: Secondary | ICD-10-CM | POA: Diagnosis not present

## 2018-08-24 DIAGNOSIS — E538 Deficiency of other specified B group vitamins: Secondary | ICD-10-CM | POA: Diagnosis not present

## 2018-08-24 NOTE — Progress Notes (Signed)
Subjective:  Patient ID: Patricia Alvarez, female    DOB: 08/12/1961,  MRN: [id number] HPI Chief Complaint  Patient presents with  . Nail Problem    bilateral edges of toenails are breaking off - left great toenail feeling like it's ingrown  . Bunions    left foot - discuss surgery - patient is having hip surgery iin a few days, so her out of pocket will be met for the year    57 y.o. female presents with the above complaint.   ROS: Denies fever chills nausea vomiting muscle aches pains calf pain back pain chest pain shortness of breath.  Past Medical History:  Diagnosis Date  . Allergic rhinitis   . Allergy   . B12 deficiency   . Back pain   . Chicken pox   . Colon polyp 2015   TUBULAR ADENOMA (X 1).  . DDD (degenerative disc disease), cervical   . Endometriosis of uterus   . GERD (gastroesophageal reflux disease)   . Hyperlipidemia   . IBS (irritable bowel syndrome)   . Internal hemorrhoid   . Migraines   . Rectal bleeding   . Vitamin D deficiency    Past Surgical History:  Procedure Laterality Date  . APPENDECTOMY  02/2000   and ovary  . AUGMENTATION MAMMAPLASTY Bilateral 1998   Saline,retropectoral   . COLONOSCOPY W/ BIOPSIES  2015   multiple  . ESOPHAGOGASTRODUODENOSCOPY  2011   multiple  . LAPAROSCOPY    . OOPHORECTOMY Left 02/2000   w/appendectomy  . PARTIAL HYSTERECTOMY  1994   right ovary and uterus    Current Outpatient Medications:  .  amitriptyline (ELAVIL) 25 MG tablet, amitriptyline 25 mg tablet, Disp: , Rfl:  .  aspirin-acetaminophen-caffeine (EXCEDRIN MIGRAINE) 250-250-65 MG tablet, Take by mouth as needed for headache., Disp: , Rfl:  .  cyanocobalamin (,VITAMIN B-12,) 1000 MCG/ML injection, Inject 1 mL (1,000 mcg total) into the muscle every 30 (thirty) days., Disp: 10 mL, Rfl: 0 .  diclofenac (VOLTAREN) 75 MG EC tablet, Take 75 mg by mouth daily. , Disp: , Rfl: 3 .  estradiol (ESTRACE) 0.5 MG tablet, Take 0.5 mg by mouth daily., Disp: , Rfl: 3 .   fluconazole (DIFLUCAN) 150 MG tablet, TAKE 1 TABLET BY MOUTH STAT REPEAT IN 3 DAYS, Disp: , Rfl: 0 .  magnesium 30 MG tablet, Take 30 mg by mouth daily., Disp: , Rfl:  .  omeprazole (PRILOSEC) 40 MG capsule, Take 1 capsule (40 mg total) by mouth daily., Disp: 30 capsule, Rfl: 11 .  omeprazole (PRILOSEC) 40 MG capsule, Take 1 capsule (40 mg total) by mouth daily., Disp: 30 capsule, Rfl: 11 .  omeprazole (PRILOSEC) 40 MG capsule, Take 1 capsule (40 mg total) by mouth daily., Disp: 90 capsule, Rfl: 3 .  rizatriptan (MAXALT-MLT) 10 MG disintegrating tablet, Take 1 tablet (10 mg total) by mouth as needed for migraine. May repeat in 2 hours if needed, Disp: 9 tablet, Rfl: 6 .  vitamin B-12 (CYANOCOBALAMIN) 100 MCG tablet, Take 100 mcg by mouth daily., Disp: , Rfl:  .  Vitamin D, Ergocalciferol, 2000 units CAPS, Take 2,000 Units by mouth daily., Disp: , Rfl:   Allergies  Allergen Reactions  . Codeine Itching and Nausea Only    REACTION: nausea  . Morphine Nausea Only    REACTION: nausea  . Prednisone     REACTION: Hyperactive   Review of Systems Objective:   Vitals:   08/24/18 1156  BP: 98/69  Pulse: 71      General: Well developed, nourished, in no acute distress, alert and oriented x3   Dermatological: Skin is warm, dry and supple bilateral. Nails x 10 are well maintained; remaining integument appears unremarkable at this time. There are no open sores, no preulcerative lesions, no rash or signs of infection present.  Vascular: Dorsalis Pedis artery and Posterior Tibial artery pedal pulses are 2/4 bilateral with immedate capillary fill time. Pedal hair growth present. No varicosities and no lower extremity edema present bilateral.   Neruologic: Grossly intact via light touch bilateral. Vibratory intact via tuning fork bilateral. Protective threshold with Semmes Wienstein monofilament intact to all pedal sites bilateral. Patellar and Achilles deep tendon reflexes 2+ bilateral. No Babinski or  clonus noted bilateral.   Musculoskeletal: No gross boney pedal deformities bilateral. No pain, crepitus, or limitation noted with foot and ankle range of motion bilateral. Muscular strength 5/5 in all groups tested bilateral.  She has had previous bunion repair to her first metatarsophalangeal joint left foot hallux valgus is present still she has limited range of motion on plantar flexion dorsiflexion is good.  Painful range of motion is noted.  Gait: Unassisted, Nonantalgic.    Radiographs:  Hypertrophic medial condyle with increase in the first intermetatarsal angle resulting in hallux valgus.  Long second toes noted.  Assessment & Plan:   Assessment: Hallux valgus left and probable biotin deficiency resulting in brittle nails.  Plan: Recommended over-the-counter biotin products and she will follow-up with me after her hip replacement in the near future for surgical correction of her left foot.     Panzy Bubeck T. [place], [place]

## 2018-08-27 HISTORY — PX: TOTAL HIP ARTHROPLASTY: SHX124

## 2018-08-31 DIAGNOSIS — M1611 Unilateral primary osteoarthritis, right hip: Secondary | ICD-10-CM | POA: Diagnosis not present

## 2018-08-31 DIAGNOSIS — Z96651 Presence of right artificial knee joint: Secondary | ICD-10-CM | POA: Diagnosis not present

## 2018-09-15 ENCOUNTER — Ambulatory Visit: Payer: BLUE CROSS/BLUE SHIELD

## 2018-09-16 ENCOUNTER — Ambulatory Visit: Payer: BLUE CROSS/BLUE SHIELD

## 2018-09-17 ENCOUNTER — Ambulatory Visit (INDEPENDENT_AMBULATORY_CARE_PROVIDER_SITE_OTHER): Payer: BLUE CROSS/BLUE SHIELD

## 2018-09-17 DIAGNOSIS — E538 Deficiency of other specified B group vitamins: Secondary | ICD-10-CM | POA: Diagnosis not present

## 2018-09-17 MED ORDER — CYANOCOBALAMIN 1000 MCG/ML IJ SOLN
1000.0000 ug | Freq: Once | INTRAMUSCULAR | Status: AC
  Administered 2018-09-17: 1000 ug via INTRAMUSCULAR

## 2018-09-17 NOTE — Progress Notes (Addendum)
Patricia Alvarez is a 57 y.o. female presents to the office today for Vitamin B12 injections, per physician's orders. Original order: 05/07/18 Vitamin B12, 1 ml, IM, Right deltoid was administered today. Patient tolerated injection. Patient due for follow up labs/provider appt: No. Date due: , appt made No Patient next injection due: 1 month, appt made Yes  [place] screening examination/treatment/procedure(s) were performed by non-physician practitioner and as supervising physician I was immediately available for consultation/collaboration.  I agree with above assessment and plan.  Electronically Signed by: Howard Pouch, DO [place] [facility] [place]

## 2018-09-28 ENCOUNTER — Encounter: Payer: Self-pay | Admitting: Gastroenterology

## 2018-09-30 ENCOUNTER — Ambulatory Visit (INDEPENDENT_AMBULATORY_CARE_PROVIDER_SITE_OTHER): Payer: BLUE CROSS/BLUE SHIELD | Admitting: *Deleted

## 2018-09-30 DIAGNOSIS — E538 Deficiency of other specified B group vitamins: Secondary | ICD-10-CM | POA: Diagnosis not present

## 2018-09-30 MED ORDER — CYANOCOBALAMIN 1000 MCG/ML IJ SOLN
1000.0000 ug | Freq: Once | INTRAMUSCULAR | Status: AC
  Administered 2018-09-30: 1000 ug via INTRAMUSCULAR

## 2018-09-30 NOTE — Progress Notes (Addendum)
Patricia Alvarez is a 57 y.o. female presents to the office today for Vitamin b12 injections, per physician's orders. Original order: 05/07/2018 Vitamin B12, 1ML (1042mcg/ml), Left Deltoid was administered today. Patient tolerated injection. Patient due for follow up labs/provider appt: No. Date due: , appt made No Patient next injection due: 2 weeks, appt made Yes  [facility]

## 2018-10-01 ENCOUNTER — Ambulatory Visit: Payer: BLUE CROSS/BLUE SHIELD

## 2018-10-05 DIAGNOSIS — Z471 Aftercare following joint replacement surgery: Secondary | ICD-10-CM | POA: Diagnosis not present

## 2018-10-05 DIAGNOSIS — Z96641 Presence of right artificial hip joint: Secondary | ICD-10-CM | POA: Diagnosis not present

## 2018-10-14 ENCOUNTER — Ambulatory Visit: Payer: BLUE CROSS/BLUE SHIELD

## 2018-10-15 ENCOUNTER — Ambulatory Visit (INDEPENDENT_AMBULATORY_CARE_PROVIDER_SITE_OTHER): Payer: BLUE CROSS/BLUE SHIELD

## 2018-10-15 DIAGNOSIS — E538 Deficiency of other specified B group vitamins: Secondary | ICD-10-CM | POA: Diagnosis not present

## 2018-10-15 MED ORDER — CYANOCOBALAMIN 1000 MCG/ML IJ SOLN
1000.0000 ug | Freq: Once | INTRAMUSCULAR | Status: AC
  Administered 2018-10-15: 1000 ug via INTRAMUSCULAR

## 2018-10-15 NOTE — Progress Notes (Signed)
Pt received her B12 injection Im in the Rt deltoid lot #[id number], exp 10/28/19. Pt tolerated the injection well w no side effects noted. Pt will return in 2 wks for B12 injection.

## 2018-10-29 ENCOUNTER — Ambulatory Visit (AMBULATORY_SURGERY_CENTER): Payer: Self-pay

## 2018-10-29 ENCOUNTER — Encounter: Payer: Self-pay | Admitting: Gastroenterology

## 2018-10-29 ENCOUNTER — Other Ambulatory Visit: Payer: Self-pay

## 2018-10-29 ENCOUNTER — Ambulatory Visit (INDEPENDENT_AMBULATORY_CARE_PROVIDER_SITE_OTHER): Payer: BLUE CROSS/BLUE SHIELD | Admitting: Family Medicine

## 2018-10-29 VITALS — Ht 65.0 in | Wt 146.2 lb

## 2018-10-29 DIAGNOSIS — Z86010 Personal history of colonic polyps: Secondary | ICD-10-CM

## 2018-10-29 DIAGNOSIS — E538 Deficiency of other specified B group vitamins: Secondary | ICD-10-CM | POA: Diagnosis not present

## 2018-10-29 MED ORDER — CYANOCOBALAMIN 1000 MCG/ML IJ SOLN
1000.0000 ug | Freq: Once | INTRAMUSCULAR | Status: AC
  Administered 2018-10-29: 1000 ug via INTRAMUSCULAR

## 2018-10-29 MED ORDER — NA SULFATE-K SULFATE-MG SULF 17.5-3.13-1.6 GM/177ML PO SOLN: 1.0000 | Freq: Once | ORAL | 0 refills | Status: AC

## 2018-10-29 NOTE — Progress Notes (Signed)
No egg or soy allergy known to patient  No issues with past sedation with any surgeries  or procedures, no intubation problems  No diet pills per patient No home 02 use per patient  No blood thinners per patient  Pt denies issues with constipation  No A fib or A flutter  EMMI video sent to pt's e mail , pt declined    

## 2018-10-29 NOTE — Progress Notes (Addendum)
Patricia Alvarez is a 58 y.o. female presents to the office today for Vitamin B12 injections, per physician's orders. Original order:7.12.19 Vitamin B12 109mcg IM was administered right deltoid today. Patient tolerated injection. Patient due for follow up labs/provider appt: Yes. Date due:, appt made No, patient will have one more injection and then make follow up appointment with provider.  Patient next injection due: 11/12/2018, appt made Yes  Jacklynn Ganong, [place] screening examination/treatment/procedure(s) were performed by non-physician practitioner and as supervising physician I was immediately available for consultation/collaboration.  I agree with above assessment and plan.  Electronically Signed by: Howard Pouch, DO [place] [facility] [place]

## 2018-11-05 ENCOUNTER — Ambulatory Visit (AMBULATORY_SURGERY_CENTER): Payer: BLUE CROSS/BLUE SHIELD | Admitting: Gastroenterology

## 2018-11-05 ENCOUNTER — Encounter: Payer: Self-pay | Admitting: Gastroenterology

## 2018-11-05 VITALS — BP 121/79 | HR 76 | Temp 96.8°F | Resp 12 | Ht 65.0 in | Wt 140.0 lb

## 2018-11-05 DIAGNOSIS — Z86010 Personal history of colonic polyps: Secondary | ICD-10-CM | POA: Diagnosis not present

## 2018-11-05 NOTE — Op Note (Signed)
[place] Patient Name: Patricia Alvarez Procedure Date: 11/05/2018 8:04 AM MRN: [id number] Endoscopist: Ladene Artist , MD Age: 58 Referring MD:  Date of Birth: June 14, 1961 Gender: Female Account #: [id number] Procedure:                Colonoscopy Indications:              Surveillance: Personal history of adenomatous                            polyps on last colonoscopy 5 years ago Medicines:                Monitored Anesthesia Care Procedure:                Pre-Anesthesia Assessment:                           - Prior to the procedure, a History and Physical                            was performed, and patient medications and                            allergies were reviewed. The patient's tolerance of                            previous anesthesia was also reviewed. The risks                            and benefits of the procedure and the sedation                            options and risks were discussed with the patient.                            All questions were answered, and informed consent                            was obtained. Prior Anticoagulants: The patient has                            taken no previous anticoagulant or antiplatelet                            agents. ASA Grade Assessment: II - A patient with                            mild systemic disease. After reviewing the risks                            and benefits, the patient was deemed in                            satisfactory condition to undergo the procedure.  After obtaining informed consent, the colonoscope                            was passed under direct vision. Throughout the                            procedure, the patient's blood pressure, pulse, and                            oxygen saturations were monitored continuously. The                            Colonoscope was introduced through the anus and                            advanced to the the cecum,  identified by                            appendiceal orifice and ileocecal valve. The                            ileocecal valve, appendiceal orifice, and rectum                            were photographed. The quality of the bowel                            preparation was good. The colonoscopy was performed                            without difficulty. The patient tolerated the                            procedure well. Scope In: 8:08:10 AM Scope Out: 8:25:16 AM Scope Withdrawal Time: 0 hours 13 minutes 52 seconds  Total Procedure Duration: 0 hours 17 minutes 6 seconds  Findings:                 The perianal and digital rectal examinations were                            normal.                           Internal hemorrhoids were found during                            retroflexion. The hemorrhoids were small and Grade                            I (internal hemorrhoids that do not prolapse).                           The exam was otherwise without abnormality on  direct and retroflexion views. Complications:            No immediate complications. Estimated blood loss:                            None. Estimated Blood Loss:     Estimated blood loss: none. Impression:               - Internal hemorrhoids.                           - The examination was otherwise normal on direct                            and retroflexion views.                           - No specimens collected. Recommendation:           - Repeat colonoscopy in 5 years for surveillance.                           - Patient has a contact number available for                            emergencies. The signs and symptoms of potential                            delayed complications were discussed with the                            patient. Return to normal activities tomorrow.                            Written discharge instructions were provided to the                            patient.                            - Resume previous diet.                           - Continue present medications. Ladene Artist, MD 11/05/2018 8:28:25 AM This report has been signed electronically.

## 2018-11-05 NOTE — Patient Instructions (Signed)
Information on hemorrhoids given to you today.   YOU HAD AN ENDOSCOPIC PROCEDURE TODAY AT [facility]:   Refer to the procedure report that was given to you for any specific questions about what was found during the examination.  If the procedure report does not answer your questions, please call your gastroenterologist to clarify.  If you requested that your care partner not be given the details of your procedure findings, then the procedure report has been included in a sealed envelope for you to review at your convenience later.  YOU SHOULD EXPECT: Some feelings of bloating in the abdomen. Passage of more gas than usual.  Walking can help get rid of the air that was put into your GI tract during the procedure and reduce the bloating. If you had a lower endoscopy (such as a colonoscopy or flexible sigmoidoscopy) you may notice spotting of blood in your stool or on the toilet paper. If you underwent a bowel prep for your procedure, you may not have a normal bowel movement for a few days.  Please Note:  You might notice some irritation and congestion in your nose or some drainage.  This is from the oxygen used during your procedure.  There is no need for concern and it should clear up in a day or so.  SYMPTOMS TO REPORT IMMEDIATELY:   Following lower endoscopy (colonoscopy or flexible sigmoidoscopy):  Excessive amounts of blood in the stool  Significant tenderness or worsening of abdominal pains  Swelling of the abdomen that is new, acute  Fever of 100F or higher   Fever of 100F or higher  Black, tarry-looking stools  For urgent or emergent issues, a gastroenterologist can be reached at any hour by calling [phone].   DIET:  We do recommend a small meal at first, but then you may proceed to your regular diet.  Drink plenty of fluids but you should avoid alcoholic beverages for 24 hours.  ACTIVITY:  You should plan to take it easy for the rest of today and you should  NOT DRIVE or use heavy machinery until tomorrow (because of the sedation medicines used during the test).    FOLLOW UP: Our staff will call the number listed on your records the next business day following your procedure to check on you and address any questions or concerns that you may have regarding the information given to you following your procedure. If we do not reach you, we will leave a message.  However, if you are feeling well and you are not experiencing any problems, there is no need to return our call.  We will assume that you have returned to your regular daily activities without incident.  If any biopsies were taken you will be contacted by phone or by letter within the next 1-3 weeks.  Please call us at [phone] if you have not heard about the biopsies in 3 weeks.    SIGNATURES/CONFIDENTIALITY: You and/or your care partner have signed paperwork which will be entered into your electronic medical record.  These signatures attest to the fact that that the information above on your After Visit Summary has been reviewed and is understood.  Full responsibility of the confidentiality of this discharge information lies with you and/or your care-partner.

## 2018-11-05 NOTE — Progress Notes (Signed)
Report to PACU, RN, vss, BBS= Clear.  

## 2018-11-08 ENCOUNTER — Telehealth: Payer: Self-pay

## 2018-11-08 ENCOUNTER — Encounter: Payer: Self-pay | Admitting: Diagnostic Neuroimaging

## 2018-11-08 ENCOUNTER — Ambulatory Visit (INDEPENDENT_AMBULATORY_CARE_PROVIDER_SITE_OTHER): Payer: BLUE CROSS/BLUE SHIELD | Admitting: Diagnostic Neuroimaging

## 2018-11-08 VITALS — BP 107/68 | HR 69 | Ht 65.0 in | Wt 146.0 lb

## 2018-11-08 DIAGNOSIS — G43109 Migraine with aura, not intractable, without status migrainosus: Secondary | ICD-10-CM | POA: Diagnosis not present

## 2018-11-08 MED ORDER — RIZATRIPTAN BENZOATE 10 MG PO TBDP: 10.0000 mg | ORAL_TABLET | ORAL | 12 refills | Status: DC | PRN

## 2018-11-08 MED ORDER — RIZATRIPTAN BENZOATE 10 MG PO TBDP: 10.0000 mg | ORAL_TABLET | ORAL | 6 refills | Status: DC | PRN

## 2018-11-08 NOTE — Patient Instructions (Signed)
-continue Maxalt for abortive therapy.  -call for worsening or more frequent migraines   Migraine Headache A migraine headache is an intense, throbbing pain on one side or both sides of the head. Migraines may also cause other symptoms, such as nausea, vomiting, and sensitivity to light and noise. What are the causes? Doing or taking certain things may also trigger migraines, such as:  Alcohol.  Smoking.  Medicines, such as: ? Medicine used to treat chest pain (nitroglycerine). ? Birth control pills. ? Estrogen pills. ? Certain blood pressure medicines.  Aged cheeses, chocolate, or caffeine.  Foods or drinks that contain nitrates, glutamate, aspartame, or tyramine.  Physical activity. Other things that may trigger a migraine include:  Menstruation.  Pregnancy.  Hunger.  Stress, lack of sleep, too much sleep, or fatigue.  Weather changes. What increases the risk? The following factors may make you more likely to experience migraine headaches:  Age. Risk increases with age.  Family history of migraine headaches.  Being Caucasian.  Depression and anxiety.  Obesity.  Being a woman.  Having a hole in the heart (patent foramen ovale) or other heart problems. What are the signs or symptoms? The main symptom of this condition is pulsating or throbbing pain. Pain may:  Happen in any area of the head, such as on one side or both sides.  Interfere with daily activities.  Get worse with physical activity.  Get worse with exposure to bright lights or loud noises. Other symptoms may include:  Nausea.  Vomiting.  Dizziness.  General sensitivity to bright lights, loud noises, or smells. Before you get a migraine, you may get warning signs that a migraine is developing (aura). An aura may include:  Seeing flashing lights or having blind spots.  Seeing bright spots, halos, or zigzag lines.  Having tunnel vision or blurred vision.  Having numbness or a  tingling feeling.  Having trouble talking.  Having muscle weakness. How is this diagnosed? A migraine headache can be diagnosed based on:  Your symptoms.  A physical exam.  Tests, such as CT scan or MRI of the head. These imaging tests can help rule out other causes of headaches.  Taking fluid from the spine (lumbar puncture) and analyzing it (cerebrospinal fluid analysis, or CSF analysis). How is this treated? A migraine headache is usually treated with medicines that:  Relieve pain.  Relieve nausea.  Prevent migraines from coming back. Treatment may also include:  Acupuncture.  Lifestyle changes like avoiding foods that trigger migraines. Follow these instructions at home: Medicines  Take over-the-counter and prescription medicines only as told by your health care provider.  Do not drive or use heavy machinery while taking prescription pain medicine.  To prevent or treat constipation while you are taking prescription pain medicine, your health care provider may recommend that you: ? Drink enough fluid to keep your urine clear or pale yellow. ? Take over-the-counter or prescription medicines. ? Eat foods that are high in fiber, such as fresh fruits and vegetables, whole grains, and beans. ? Limit foods that are high in fat and processed sugars, such as fried and sweet foods. Lifestyle  Avoid alcohol use.  Do not use any products that contain nicotine or tobacco, such as cigarettes and e-cigarettes. If you need help quitting, ask your health care provider.  Get at least 8 hours of sleep every night.  Limit your stress. General instructions      Keep a journal to find out what may trigger your migraine headaches.  For example, write down: ? What you eat and drink. ? How much sleep you get. ? Any change to your diet or medicines.  If you have a migraine: ? Avoid things that make your symptoms worse, such as bright lights. ? It may help to lie down in a dark,  quiet room. ? Do not drive or use heavy machinery. ? Ask your health care provider what activities are safe for you while you are experiencing symptoms.  Keep all follow-up visits as told by your health care provider. This is important. Contact a health care provider if:  You develop symptoms that are different or more severe than your usual migraine symptoms. Get help right away if:  Your migraine becomes severe.  You have a fever.  You have a stiff neck.  You have vision loss.  Your muscles feel weak or like you cannot control them.  You start to lose your balance often.  You develop trouble walking.  You faint. This information is not intended to replace advice given to you by your health care provider. Make sure you discuss any questions you have with your health care provider. Document Released: 10/13/2005 Document Revised: 05/02/2016 Document Reviewed: 03/31/2016 Elsevier Interactive Patient Education  2019 [employer].

## 2018-11-08 NOTE — Telephone Encounter (Signed)
Attempted to reach pt. With follow-up call following endoscopic procedure 11/08/2018.  LM on pt. Voice mail to call if she has any questions or concerns.

## 2018-11-08 NOTE — Telephone Encounter (Signed)
Called # [phone] and left a messaged we tried to reach pt for a follow up call. maw

## 2018-11-08 NOTE — Progress Notes (Signed)
[facility]  PATIENT: Patricia Alvarez DOB: [birth year]  REFERRING CLINICIAN: Raoul Pitch HISTORY FROM: patient  REASON FOR VISIT: new consult    HISTORICAL  CHIEF COMPLAINT:  Chief Complaint  Patient presents with  . Follow-up    Rm 7, alone  . Migraine    doing much better, thinks that is related to Vit B12 def.    HISTORY OF PRESENT ILLNESS:   UPDATE (11/08/18, VRP): Since last visit, doing well. She reports that she has had a migraine about once or twice a month since being seen in July, 2019. She reports that the migraine is usually located behind her ear (usually right sided but can occur on the left). Pain is described as a pulsating, pressure sensation that radiates up to the top of her head. She will have light sensitivity and occasionally nausea with migraine. She can lie in a dark room with light pressure to her head and headaches improve. She has noted an unusual smell prior to migraine. She is taking Maxalt with onset of migraine and this usually helps. She feels that she usually has another migraine the following day that is relieved with Maxalt as well. She rarely takes an Excedrin for non migrainous headaches. She denies stroke like symptoms with migraine. She is contributing the improvement to treatment of vitamin B12 and vitamin D deficiency. She has been treated with B12 injections as well as oral supplements. She is also taking vitamin D and magnesium supplements OTC. She reports that her numbness and tingling in her right hand and arm is significantly improved.   PRIOR HPI (05/03/2018, VRP): 58 year old female here for evaluation of headaches.  Patient had onset of headaches sometime in her late 36s.  She describes pounding, unilateral, mainly left-sided, throbbing severe headaches associated with nausea, blurred vision, sensitivity to sound, seeing dots and spots.  Headaches were intermittent for many years.  Patient treated these with over-the-counter  medications.  The last couple of years patient has had slight change in her headaches with sharp shooting pain in her left ear, left eye, facial numbness, lip numbness, drooling sensation, arm numbness.  These also were associated with headaches.  Patient feeling headaches and pain behind her ears and behind her head.  Headaches increased in April 2019.  Now patient having headaches 3-4 times per week.  Patient had a CT scan and MRI of the brain, as well as emergency room evaluation, and was diagnosed with probable complicated migraine.  She was prescribed amitriptyline but has not tried it yet.  Patient has family history of migraine in her mother.  No specific triggering factors currently for her headaches.  Patient has a long history of somewhat interrupted sleep and mild insomnia.  She is able to fall asleep without difficulty but then tends to wake up in the middle the night and has difficulty falling back asleep.    REVIEW OF SYSTEMS: Full 14 system review of systems performed and negative with exception of: Headache numbness fatigue.  ALLERGIES: Allergies  Allergen Reactions  . Codeine Itching and Nausea Only    REACTION: nausea  . Morphine Nausea Only    REACTION: nausea  . Prednisone     REACTION: Hyperactive    HOME MEDICATIONS: Outpatient Medications Prior to Visit  Medication Sig Dispense Refill  . aspirin-acetaminophen-caffeine (EXCEDRIN MIGRAINE) 250-250-65 MG tablet Take by mouth as needed for headache.    . cyanocobalamin (,VITAMIN B-12,) 1000 MCG/ML injection Inject 1 mL (1,000 mcg total) into the muscle every  30 (thirty) days. (Patient taking differently: Inject 1,000 mcg into the muscle. Taking every other week.) 10 mL 0  . diclofenac (VOLTAREN) 75 MG EC tablet Take 75 mg by mouth daily as needed.   3  . estradiol (ESTRACE) 0.5 MG tablet Take 0.5 mg by mouth daily.  3  . gabapentin (NEURONTIN) 300 MG capsule daily as needed.     . magnesium 30 MG tablet Take 30 mg by  mouth daily.    . methocarbamol (ROBAXIN) 500 MG tablet methocarbamol 500 mg tablet    . omeprazole (PRILOSEC) 40 MG capsule Take 1 capsule (40 mg total) by mouth daily. 90 capsule 3  . vitamin B-12 (CYANOCOBALAMIN) 100 MCG tablet Take 100 mcg by mouth daily.    . Vitamin D, Ergocalciferol, 2000 units CAPS Take 2,000 Units by mouth daily.    Marland Kitchen omeprazole (PRILOSEC) 40 MG capsule Take 1 capsule (40 mg total) by mouth daily. 30 capsule 11  . rizatriptan (MAXALT-MLT) 10 MG disintegrating tablet Take 1 tablet (10 mg total) by mouth as needed for migraine. May repeat in 2 hours if needed 9 tablet 6  . traMADol (ULTRAM) 50 MG tablet tramadol 50 mg tablet     No facility-administered medications prior to visit.     PAST MEDICAL HISTORY: Past Medical History:  Diagnosis Date  . Allergic rhinitis   . Allergy   . B12 deficiency   . Back pain   . Chicken pox   . Colon polyp 2015   TUBULAR ADENOMA (X 1).  . DDD (degenerative disc disease), cervical   . Endometriosis of uterus   . GERD (gastroesophageal reflux disease)   . Hyperlipidemia   . IBS (irritable bowel syndrome)   . Internal hemorrhoid   . Migraines   . Rectal bleeding   . Vitamin D deficiency     PAST SURGICAL HISTORY: Past Surgical History:  Procedure Laterality Date  . APPENDECTOMY  02/2000   and ovary  . AUGMENTATION MAMMAPLASTY Bilateral 1998   Saline,retropectoral   . COLONOSCOPY W/ BIOPSIES  2015   multiple  . ESOPHAGOGASTRODUODENOSCOPY  2011   multiple  . LAPAROSCOPY    . OOPHORECTOMY Left 02/2000   w/appendectomy  . PARTIAL HYSTERECTOMY  1994   right ovary and uterus  . TOTAL HIP ARTHROPLASTY Right    August 31, 2018    FAMILY HISTORY: Family History  Problem Relation Age of Onset  . Hyperlipidemia Mother 43  . Heart failure Mother 68  . Arthritis Mother   . Asthma Mother   . Hearing loss Mother   . Heart disease Mother   . Hypertension Mother   . Miscarriages / [place] Mother   . Colon polyps  Father   . Stroke Father   . Hearing loss Father   . Breast cancer Maternal Grandmother 18  . Colon cancer Neg Hx   . Esophageal cancer Neg Hx   . Rectal cancer Neg Hx   . Stomach cancer Neg Hx     SOCIAL HISTORY:  Social History   Socioeconomic History  . Marital status: Married    Spouse name: Not on file  . Number of children: 2  . Years of education: 24  . Highest education level: Not on file  Occupational History  . Occupation: Forensic psychologist: [employer]  . Financial resource strain: Not on file  . Food insecurity:    Worry: Not on file    Inability: Not on  file  . Transportation needs:    Medical: Not on file    Non-medical: Not on file  Tobacco Use  . Smoking status: Never Smoker  . Smokeless tobacco: Never Used  Substance and Sexual Activity  . Alcohol use: No  . Drug use: No  . Sexual activity: Yes    Partners: Male    Birth control/protection: Surgical  Lifestyle  . Physical activity:    Days per week: Not on file    Minutes per session: Not on file  . Stress: Not on file  Relationships  . Social connections:    Talks on phone: Not on file    Gets together: Not on file    Attends religious service: Not on file    Active member of club or organization: Not on file    Attends meetings of clubs or organizations: Not on file    Relationship status: Not on file  . Intimate partner violence:    Fear of current or ex partner: Not on file    Emotionally abused: Not on file    Physically abused: Not on file    Forced sexual activity: Not on file  Other Topics Concern  . Not on file  Social History Narrative   Married. 2 children.    HS graduate. Employed as a Herbalist.    Uses bicycle helmet, smoke alarm in the home.   Feels safe in her relationships.   Caffeine- unsweet 1 quart most days.      PHYSICAL EXAM  GENERAL EXAM/CONSTITUTIONAL: Vitals:  Vitals:   11/08/18 1607  BP: 107/68  Pulse: 69  Weight:  146 lb (66.2 kg)  Height: 5\' 5"  (1.651 m)   Body mass index is 24.3 kg/m. No exam data present  Patient is in no distress; well developed, nourished and groomed; neck is supple  CARDIOVASCULAR:  Regular rate and rhythm, no murmurs  Examination of peripheral vascular system by observati  on and palpation is normal  EYES:  Ophthalmoscopic exam of optic discs and posterior segments is normal; no papilledema or hemorrhages  MUSCULOSKELETAL:  Gait, strength, tone, movements noted in Neurologic exam below  NEUROLOGIC: MENTAL STATUS:  No flowsheet data found.  awake, alert, oriented to person, place and time  recent and remote memory intact  normal attention and concentration  language fluent, comprehension intact, naming intact,   fund of knowledge appropriate  CRANIAL NERVE:   2nd - no papilledema on fundoscopic exam  2nd, 3rd, 4th, 6th - pupils equal and reactive to light, visual fields full to confrontation, extraocular muscles intact, no nystagmus  5th - facial sensation symmetric  7th - facial strength symmetric  8th - hearing intact  9th - palate elevates symmetrically, uvula midline  11th - shoulder shrug symmetric  12th - tongue protrusion midline  MOTOR:   normal bulk and tone, full strength in the BUE, BLE   SENSORY:   normal and symmetric to light touch, temperature, vibration  COORDINATION:   finger-nose-finger  REFLEXES:   deep tendon reflexes present and symmetric  GAIT/STATION:   narrow based gait    DIAGNOSTIC DATA (LABS, IMAGING, TESTING) - I reviewed patient records, labs, notes, testing and imaging myself where available.  Lab Results  Component Value Date   WBC 5.7 07/28/2018   HGB 13.4 07/28/2018   HCT 39.9 07/28/2018   MCV 95.6 07/28/2018   PLT 268.0 07/28/2018      Component Value Date/Time   NA 138 07/28/2018 1603  K 3.9 07/28/2018 1603   CL 102 07/28/2018 1603   CO2 32 07/28/2018 1603   GLUCOSE 87  07/28/2018 1603   BUN 12 07/28/2018 1603   CREATININE 0.68 07/28/2018 1603   CALCIUM 9.3 07/28/2018 1603   PROT 6.3 07/28/2018 1603   ALBUMIN 4.0 07/28/2018 1603   AST 19 07/28/2018 1603   ALT 11 07/28/2018 1603   ALKPHOS 53 07/28/2018 1603   BILITOT 0.3 07/28/2018 1603   GFRNONAA >60 04/15/2018 1137   GFRAA >60 04/15/2018 1137   Lab Results  Component Value Date   CHOL 217 (HH) 01/06/2008   HDL 37.4 (L) 01/06/2008   LDLDIRECT 168.7 01/06/2008   TRIG 79 01/06/2008   CHOLHDL 5.8 CALC 01/06/2008   Lab Results  Component Value Date   HGBA1C 5.4 07/28/2018   Lab Results  Component Value Date   VITAMINB12 378 05/07/2018   Lab Results  Component Value Date   TSH 2.40 02/17/2018   VITD  Date Value Ref Range Status  05/07/2018 34.77 30.00 - 100.00 ng/mL Final  02/17/2018 17.59 (L) 30.00 - 100.00 ng/mL Final   Vit D, 25-Hydroxy  Date Value Ref Range Status  05/05/2012 44 30 - 89 ng/mL Final    Comment:    This assay accurately quantifies Vitamin D, which is the sum of the 25-Hydroxy forms of Vitamin D2 and D3.  Studies have shown that the optimum concentration of 25-Hydroxy Vitamin D is 30 ng/mL or higher.  Concentrations of Vitamin D between 20 and 29 ng/mL are considered to be insufficient and concentrations less than 20 ng/mL are considered to be deficient for Vitamin D.  05/08/2010 39 30 - 89 ng/mL Final    Comment:    See lab report for associated comment(s)    04/15/18 MRI brain [I reviewed images myself and agree with interpretation. -VRP]  - No acute or focal intracranial abnormality. Good general agreement with prior normal CT. - Minor foci of white matter signal abnormality, stable since 7673, uncertain, but likely doubtful, clinical significance.     ASSESSMENT AND PLAN  58 y.o. year old female here with history of migraine with aura, with worsening headaches since April 2019.  Neurologic examination and MRI of the brain are unremarkable.  Likely  represents worsening of patient's existing migraine.  Patient has had several attacks associated with neurologic/strokelike symptoms, which may represent complicated migraine.     Dx: complicated migraine  1. Migraine with aura and without status migrainosus, not intractable     PLAN:  MIGRAINE WITH AURA - continue rizatriptan as needed for migraine rescue  Meds ordered this encounter  Medications  . DISCONTD: rizatriptan (MAXALT-MLT) 10 MG disintegrating tablet    Sig: Take 1 tablet (10 mg total) by mouth as needed for migraine. May repeat in 2 hours if needed    Dispense:  9 tablet    Refill:  6    Order Specific Question:   Supervising Provider    Answer:   Melvenia Beam [id number]  . rizatriptan (MAXALT-MLT) 10 MG disintegrating tablet    Sig: Take 1 tablet (10 mg total) by mouth as needed for migraine. May repeat in 2 hours if needed    Dispense:  9 tablet    Refill:  12   Return in about 6 months (around 05/09/2019) for with Amy , with NP/PA.    Penni Bombard, MD 02/12/3789, 2:40 PM Certified in Neurology, Neurophysiology and Neuroimaging  [facility] [address]  [place], [place] [address] [phone]

## 2018-11-11 ENCOUNTER — Ambulatory Visit (INDEPENDENT_AMBULATORY_CARE_PROVIDER_SITE_OTHER): Payer: BLUE CROSS/BLUE SHIELD

## 2018-11-11 DIAGNOSIS — E538 Deficiency of other specified B group vitamins: Secondary | ICD-10-CM

## 2018-11-11 MED ORDER — CYANOCOBALAMIN 1000 MCG/ML IJ SOLN
1000.0000 ug | Freq: Once | INTRAMUSCULAR | Status: AC
  Administered 2018-11-11: 1000 ug via INTRAMUSCULAR

## 2018-11-11 NOTE — Progress Notes (Signed)
Patricia Alvarez is a 58 y.o. female presents to the office today for her 12th B12 injection, per physician's orders. Original order: 05/07/18  Cyanocobalamin, 1000 mcg,  IM was administered L deltoid today. Patient tolerated injection. Patient due for follow up labs/provider appt: yes  Date due: 11/26/18 , appt made yes  [name]

## 2018-11-24 DIAGNOSIS — L918 Other hypertrophic disorders of the skin: Secondary | ICD-10-CM | POA: Diagnosis not present

## 2018-11-24 DIAGNOSIS — D2271 Melanocytic nevi of right lower limb, including hip: Secondary | ICD-10-CM | POA: Diagnosis not present

## 2018-11-24 DIAGNOSIS — L821 Other seborrheic keratosis: Secondary | ICD-10-CM | POA: Diagnosis not present

## 2018-11-24 DIAGNOSIS — D2272 Melanocytic nevi of left lower limb, including hip: Secondary | ICD-10-CM | POA: Diagnosis not present

## 2018-11-24 DIAGNOSIS — D2261 Melanocytic nevi of right upper limb, including shoulder: Secondary | ICD-10-CM | POA: Diagnosis not present

## 2018-11-24 DIAGNOSIS — D2262 Melanocytic nevi of left upper limb, including shoulder: Secondary | ICD-10-CM | POA: Diagnosis not present

## 2018-11-26 ENCOUNTER — Ambulatory Visit: Payer: BLUE CROSS/BLUE SHIELD | Admitting: Family Medicine

## 2018-11-29 ENCOUNTER — Ambulatory Visit: Payer: BLUE CROSS/BLUE SHIELD | Admitting: Family Medicine

## 2018-12-22 NOTE — Progress Notes (Signed)
Pt with vit B12 deficiency.  Agree with vit B12 1000 mcg IM in office today.  Signed:  Crissie Sickles, MD           12/22/2018

## 2018-12-22 NOTE — Progress Notes (Signed)
Pt with vit B12 deficiency.  Agree with vit B12 1000 mcg IM in office today. Signed:  Crissie Sickles, MD           12/22/2018

## 2018-12-24 ENCOUNTER — Ambulatory Visit (INDEPENDENT_AMBULATORY_CARE_PROVIDER_SITE_OTHER): Payer: BLUE CROSS/BLUE SHIELD | Admitting: Family Medicine

## 2018-12-24 ENCOUNTER — Encounter: Payer: Self-pay | Admitting: Family Medicine

## 2018-12-24 VITALS — BP 103/71 | HR 64 | Temp 97.7°F | Resp 16 | Ht 65.0 in | Wt 145.5 lb

## 2018-12-24 DIAGNOSIS — E538 Deficiency of other specified B group vitamins: Secondary | ICD-10-CM

## 2018-12-24 DIAGNOSIS — R519 Headache, unspecified: Secondary | ICD-10-CM

## 2018-12-24 DIAGNOSIS — R51 Headache: Secondary | ICD-10-CM

## 2018-12-24 DIAGNOSIS — E559 Vitamin D deficiency, unspecified: Secondary | ICD-10-CM

## 2018-12-24 DIAGNOSIS — G8929 Other chronic pain: Secondary | ICD-10-CM

## 2018-12-24 LAB — VITAMIN D 25 HYDROXY (VIT D DEFICIENCY, FRACTURES): VITD: 43.8 ng/mL (ref 30.00–100.00)

## 2018-12-24 LAB — VITAMIN B12: VITAMIN B 12: 795 pg/mL (ref 211–911)

## 2018-12-24 MED ORDER — CYANOCOBALAMIN 1000 MCG/ML IJ SOLN
1000.0000 ug | Freq: Once | INTRAMUSCULAR | Status: AC
  Administered 2018-12-24: 1000 ug via INTRAMUSCULAR

## 2018-12-24 NOTE — Progress Notes (Signed)
Patient ID: Patricia Alvarez, female  DOB: 24-Feb-1961, 58 y.o.   MRN: [id number] Patient Care Team    Relationship Specialty Notifications Start End  Ma Hillock, DO PCP - General Family Medicine  02/17/18   Ladene Artist, MD Consulting Physician Gastroenterology  02/17/18   Teodoro Spray, OD  Optometry  02/17/18   Molli Posey, MD Consulting Physician Obstetrics and Gynecology  02/17/18     Chief Complaint  Patient presents with  . Follow-up    Pt is here for chronic medical conditions F/U for Vitamin B12 Def, Vit D def, migraines     Subjective:  Patricia Alvarez is a 58 y.o.  female present for follow up on b12 and vit d level  Migraines/B12 deficiency/ vit d def:  Patient presents today for follow-up on her migraines, B12 deficiency and vitamin D deficiency.  She reports her migraines and facial paresthesias have greatly improved since starting the B12 injections.  She is currently completing the B12 injections every 2 weeks.  She has missed a few doses secondary to being out of town.  Last dose was 11/11/2018.  She is taking oral supplementation as well.  She reports from having headaches 3-4 times a week to less than 1 migraine in over a month.  When she does have a migraine she is using Maxalt and it is relieving the headache for her.  SHe is on long-term chronic PPI. Prior note:   Long h/o chronic migraine headaches. She has seen neuro many years ago and had her new neuro appt this week.  Headaches reported to mostly affect left posterior auricular/occipital area. She was found to have b12 and vit d def after her last visit. She has completed her injections of b12 and her vit d 50000 u for 12 weeks (one left). She states she felt much better starting the supplements and magnesium. Neuro has also started on Elavil 25 mg. She just started but feels oversedated despite taking it at 8 pm. She has also been seen at ED since last visit when her attack hit, and they ruled out  stroke/TIA and MS which is very relieved to here.  Prior note:  She is without symptoms today.  She reports onset of symptoms occurred approximately 2 years ago when she experienced a sharp deep pain in her left ear, eventually "moved to "to her posterior auricular area and  lasted less than 1 minute.  Since that time she has had intermittent moments where she has had a mixture of symptoms that occur, but not always present at the same time, of her tongue and lower lip being numb, increase in saliva where she feels that she drools from bilateral sides, left arm feels "cold "on the inside but no weakness that occurs a few times a week, left lower side of face will feel numb or tingling, and intermittent ringing of the ears that is very rare and last less than 45 seconds.  She sustained an injury, "whiplash", in 1997 and had an x-ray in 2012 at a chiropractor office that was reviewed that showed degenerative disc disease and osteophyte formation.  December 05, 2015 she experienced a gradual onset with persistent left-sided face numbness, left ear pain, tip of her tongue being numb, left arm felt numb and cold and was seen in the emergency room.  A MRI was completed at that time showed a small region of abnormal T2 and FLAIR signal in the subcortical white matter of the  right posterior frontal region. He has been prescribed Voltaren over the last year, however she states she rarely takes the medication and the tinnitus was not associated with the start of Voltaren.  She denies any hearing loss.Fhx hearing [place] mother and father  at age 88-80.  She has a family history of stroke.  She has a history of vitamin D and vitamin B12 deficiency.  She denies any visual changes, slurred speech, hearing impairment or gait impairment during her episodes.  She reports history of migraines that usually respond to Excedrin migraine if she is able to take it soon enough.  She states she usually has a rebound headache a day or 2  after using Excedrin Migraine.    Result Date: 04/15/2018 CLINICAL DATA:  Onset of headache yesterday, jaw tightness, focal neural deficit of less than 6 hours stroke suspected EXAM: CT HEAD WITHOUT CONTRAST TECHNIQUE: Contiguous axial images were obtained from the base of the skull through the vertex without intravenous contrast. Sagittal and coronal MPR images reconstructed from axial data set. COMPARISON:  None FINDINGS: Brain: Normal ventricular morphology. No midline shift or mass effect. Normal appearance of brain parenchyma. No intracranial hemorrhage, mass lesion, or evidence of acute infarction. No extra-axial fluid collections. Vascular: Unremarkable Skull: Intact Sinuses/Orbits: Clear Other: N/A IMPRESSION: Normal exam. Electronically Signed   By: Lavonia Dana M.D.   On: 04/15/2018 12:20   Mr Brain Wo Contrast (neuro Protocol) Result Date: 04/15/2018  IMPRESSION: No acute or focal intracranial abnormality. Good general agreement with prior normal CT. Minor foci of white matter signal abnormality, stable since 3329, uncertain, but likely doubtful, clinical significance. Electronically Signed   By: Staci Righter M.D.   On: 04/15/2018 14:02   MRI  12/05/2015: IMPRESSION: No acute finding. 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   Depression screen [facility] 2/9 12/24/2018 07/28/2018 02/17/2018  Decreased Interest 0 0 0  Down, Depressed, Hopeless 0 0 0  PHQ - 2 Score 0 0 0   No flowsheet data found.      Fall Risk  07/28/2018 02/17/2018  Falls in the past year? No No    Immunization History  Administered Date(s) Administered  . Influenza,inj,Quad PF,6+ Mos 07/28/2018  . Td 06/14/2001  . Tdap 04/27/2014    No exam data present  Past Medical History:  Diagnosis Date  . Allergic rhinitis   . Allergy   . B12 deficiency   . Back pain   . Chicken pox   . Colon  polyp 2015   TUBULAR ADENOMA (X 1).  . DDD (degenerative disc disease), cervical   . Endometriosis of uterus   . GERD (gastroesophageal reflux disease)   . Hyperlipidemia   . IBS (irritable bowel syndrome)   . Internal hemorrhoid   . Migraines   . Rectal bleeding   . Vitamin D deficiency    Allergies  Allergen Reactions  . Codeine Itching and Nausea Only    REACTION: nausea  . Morphine Nausea Only    REACTION: nausea  . Prednisone     REACTION: Hyperactive   Past Surgical History:  Procedure Laterality Date  . APPENDECTOMY  02/2000   and ovary  . AUGMENTATION MAMMAPLASTY Bilateral 1998   Saline,retropectoral   . COLONOSCOPY W/ BIOPSIES  2015   multiple  . ESOPHAGOGASTRODUODENOSCOPY  2011   multiple  . LAPAROSCOPY    .  OOPHORECTOMY Left 02/2000   w/appendectomy  . PARTIAL HYSTERECTOMY  1994   right ovary and uterus  . TOTAL HIP ARTHROPLASTY Right    August 31, 2018   Family History  Problem Relation Age of Onset  . Hyperlipidemia Mother 34  . Heart failure Mother 11  . Arthritis Mother   . Asthma Mother   . Hearing loss Mother   . Heart disease Mother   . Hypertension Mother   . Miscarriages / [place] Mother   . Colon polyps Father   . Stroke Father   . Hearing loss Father   . Breast cancer Maternal Grandmother 68  . Colon cancer Neg Hx   . Esophageal cancer Neg Hx   . Rectal cancer Neg Hx   . Stomach cancer Neg Hx    Social History   Socioeconomic History  . Marital status: Married    Spouse name: Not on file  . Number of children: 2  . Years of education: 43  . Highest education level: Not on file  Occupational History  . Occupation: Forensic psychologist: [employer]  . Financial resource strain: Not on file  . Food insecurity:    Worry: Not on file    Inability: Not on file  . Transportation needs:    Medical: Not on file    Non-medical: Not on file  Tobacco Use  . Smoking status: Never Smoker  . Smokeless  tobacco: Never Used  Substance and Sexual Activity  . Alcohol use: No  . Drug use: No  . Sexual activity: Yes    Partners: Male    Birth control/protection: Surgical  Lifestyle  . Physical activity:    Days per week: Not on file    Minutes per session: Not on file  . Stress: Not on file  Relationships  . Social connections:    Talks on phone: Not on file    Gets together: Not on file    Attends religious service: Not on file    Active member of club or organization: Not on file    Attends meetings of clubs or organizations: Not on file    Relationship status: Not on file  . Intimate partner violence:    Fear of current or ex partner: Not on file    Emotionally abused: Not on file    Physically abused: Not on file    Forced sexual activity: Not on file  Other Topics Concern  . Not on file  Social History Narrative   Married. 2 children.    HS graduate. Employed as a Herbalist.    Uses bicycle helmet, smoke alarm in the home.   Feels safe in her relationships.   Caffeine- unsweet 1 quart most days.    Allergies as of 12/24/2018      Reactions   Codeine Itching, Nausea Only   REACTION: nausea   Morphine Nausea Only   REACTION: nausea   Prednisone    REACTION: Hyperactive      Medication List       Accurate as of December 24, 2018  4:33 PM. Always use your most recent med list.        aspirin-acetaminophen-caffeine 250-250-65 MG tablet Commonly known as:  EXCEDRIN MIGRAINE Take by mouth as needed for headache.   vitamin B-12 100 MCG tablet Commonly known as:  CYANOCOBALAMIN Take 100 mcg by mouth daily.   cyanocobalamin 1000 MCG/ML injection Commonly known as:  (VITAMIN B-12) Inject  1 mL (1,000 mcg total) into the muscle every 30 (thirty) days.   estradiol 0.5 MG tablet Commonly known as:  ESTRACE Take 0.5 mg by mouth daily.   magnesium 30 MG tablet Take 30 mg by mouth daily.   omeprazole 40 MG capsule Commonly known as:  PRILOSEC Take 1 capsule  (40 mg total) by mouth daily.   rizatriptan 10 MG disintegrating tablet Commonly known as:  MAXALT-MLT Take 1 tablet (10 mg total) by mouth as needed for migraine. May repeat in 2 hours if needed   Vitamin D (Ergocalciferol) 50 MCG (2000 UT) Caps Take 2,000 Units by mouth daily.       All past medical history, surgical history, allergies, family history, immunizations andmedications were updated in the EMR today and reviewed under the history and medication portions of their EMR.    No results found for this or any previous visit (from the past 2160 hour(s)).  ROS: 14 pt review of systems performed and negative (unless mentioned in an HPI)  Objective: BP 103/71 (BP Location: Right Arm, Patient Position: Sitting, Cuff Size: Normal)   Pulse 64   Temp 97.7 F (36.5 C) (Oral)   Resp 16   Ht 5\' 5"  (1.651 m)   Wt 145 lb 8 oz (66 kg)   SpO2 100%   BMI 24.21 kg/m  Gen: Afebrile. No acute distress.  Nontoxic.  Very pleasant Caucasian female. HENT: AT. [place]. MMM.  Eyes:Pupils Equal Round Reactive to light, Extraocular movements intact,  Conjunctiva without redness, discharge or icterus. CV: RRR  Chest: CTAB, no wheeze or crackles Neuro:  Normal gait. PERLA. EOMi. Alert. Oriented x3   No exam data present  Assessment/plan: CLIFFIE GINGRAS is a 58 y.o. female present for Establishment.  Vitamin B12 deficiency/vitamin D deficiency/chronic headache -Greatly improved with the addition of B12 injections.  We will collect levels today.  Patient will likely need to continue B12 injections every 2 weeks. - Vitamin B12 - cyanocobalamin ((VITAMIN B-12)) injection 1,000 mcg -Vitamin D collected today -Follow-up in 6 months with CPE and B12 recheck.     Return in about 6 months (around 06/24/2019) for CPE.   Note is dictated utilizing voice recognition software. Although note has been proof read prior to signing, occasional typographical errors still can be missed. If any questions arise,  please do not hesitate to call for verification.  Electronically signed by: Howard Pouch, DO [place]

## 2018-12-24 NOTE — Patient Instructions (Addendum)
We will guide you on scheduling your b12 after we get the results today physical in 6 months.   Please help [place] help you:  We are honored you have chosen [facility] for your Primary Care home. Below you will find basic instructions that you may need to access in the future. Please help [place] help you by reading the instructions, which cover many of the frequent questions we experience.   Prescription refills and request:  -In order to allow more efficient response time, please call your pharmacy for all refills. They will forward the request electronically to [place]. This allows for the quickest possible response. Request left on a nurse line can take longer to refill, since these are checked as time allows between office patients and other phone calls.  - refill request can take up to 3-5 working days to complete.  - If request is sent electronically and request is appropiate, it is usually completed in 1-2 business days.  - all patients will need to be seen routinely for all chronic medical conditions requiring prescription medications (see follow-up below). If you are overdue for follow up on your condition, you will be asked to make an appointment and we will call in enough medication to cover you until your appointment (up to 30 days).  - all controlled substances will require a face to face visit to request/refill.  - if you desire your prescriptions to go through a new pharmacy, and have an active script at original pharmacy, you will need to call your pharmacy and have scripts transferred to new pharmacy. This is completed between the pharmacy locations and not by your provider.    Results: If any images or labs were ordered, it can take up to 1 week to get results depending on the test ordered and the lab/facility running and resulting the test. - Normal or stable results, which do not need further discussion, may be released to your mychart immediately with attached note to you. A call may  not be generated for normal results. Please make certain to sign up for mychart. If you have questions on how to activate your mychart you can call the front office.  - If your results need further discussion, our office will attempt to contact you via phone, and if unable to reach you after 2 attempts, we will release your abnormal result to your mychart with instructions.  - All results will be automatically released in mychart after 1 week.  - Your provider will provide you with explanation and instruction on all relevant material in your results. Please keep in mind, results and labs may appear confusing or abnormal to the untrained eye, but it does not mean they are actually abnormal for you personally. If you have any questions about your results that are not covered, or you desire more detailed explanation than what was provided, you should make an appointment with your provider to do so.   Our office handles many outgoing and incoming calls daily. If we have not contacted you within 1 week about your results, please check your mychart to see if there is a message first and if not, then contact our office.  In helping with this matter, you help decrease call volume, and therefore allow [place] to be able to respond to patients needs more efficiently.   Acute office visits (sick visit):  An acute visit is intended for a new problem and are scheduled in shorter time slots to allow schedule openings for  patients with new problems. This is the appropriate visit to discuss a new problem. Problems will not be addressed by phone call or Echart message. Appointment is needed if requesting treatment. In order to provide you with excellent quality medical care with proper time for you to explain your problem, have an exam and receive treatment with instructions, these appointments should be limited to one new problem per visit. If you experience a new problem, in which you desire to be addressed, please make an  acute office visit, we save openings on the schedule to accommodate you. Please do not save your new problem for any other type of visit, let us take care of it properly and quickly for you.   Follow up visits:  Depending on your condition(s) your provider will need to see you routinely in order to provide you with quality care and prescribe medication(s). Most chronic conditions (Example: hypertension, Diabetes, depression/anxiety... etc), require visits a couple times a year. Your provider will instruct you on proper follow up for your personal medical conditions and history. Please make certain to make follow up appointments for your condition as instructed. Failing to do so could result in lapse in your medication treatment/refills. If you request a refill, and are overdue to be seen on a condition, we will always provide you with a 30 day script (once) to allow you time to schedule.    Medicare wellness (well visit): - we have a wonderful Nurse Maudie Mercury), that will meet with you and provide you will yearly medicare wellness visits. These visits should occur yearly (can not be scheduled less than 1 calendar year apart) and cover preventive health, immunizations, advance directives and screenings you are entitled to yearly through your medicare benefits. Do not miss out on your entitled benefits, this is when medicare will pay for these benefits to be ordered for you.  These are strongly encouraged by your provider and is the appropriate type of visit to make certain you are up to date with all preventive health benefits. If you have not had your medicare wellness exam in the last 12 months, please make certain to schedule one by calling the office and schedule your medicare wellness with Maudie Mercury as soon as possible.   Yearly physical (well visit):  - Adults are recommended to be seen yearly for physicals. Check with your insurance and date of your last physical, most insurances require one calendar year  between physicals. Physicals include all preventive health topics, screenings, medical exam and labs that are appropriate for gender/age and history. You may have fasting labs needed at this visit. This is a well visit (not a sick visit), new problems should not be covered during this visit (see acute visit).  - Pediatric patients are seen more frequently when they are younger. Your provider will advise you on well child visit timing that is appropriate for your their age. - This is not a medicare wellness visit. Medicare wellness exams do not have an exam portion to the visit. Some medicare companies allow for a physical, some do not allow a yearly physical. If your medicare allows a yearly physical you can schedule the medicare wellness with our nurse Maudie Mercury and have your physical with your provider after, on the same day. Please check with insurance for your full benefits.   Late Policy/No Shows:  - all new patients should arrive 15-30 minutes earlier than appointment to allow [place] time  to  obtain all personal demographics,  insurance information and for  you to complete office paperwork. - All established patients should arrive 10-15 minutes earlier than appointment time to update all information and be checked in .  - In our best efforts to run on time, if you are late for your appointment you will be asked to either reschedule or if able, we will work you back into the schedule. There will be a wait time to work you back in the schedule,  depending on availability.  - If you are unable to make it to your appointment as scheduled, please call 24 hours ahead of time to allow [place] to fill the time slot with someone else who needs to be seen. If you do not cancel your appointment ahead of time, you may be charged a no show fee.

## 2018-12-27 ENCOUNTER — Telehealth: Payer: Self-pay | Admitting: Family Medicine

## 2018-12-27 MED ORDER — CYANOCOBALAMIN 1000 MCG/ML IJ SOLN: 1000.0000 ug | INTRAMUSCULAR | Status: AC

## 2018-12-27 MED ORDER — CYANOCOBALAMIN 1000 MCG/ML IJ SOLN: 1000.0000 ug | INTRAMUSCULAR | Status: DC

## 2018-12-27 NOTE — Telephone Encounter (Signed)
Please inform patient the following information: Her b12 and vit d levels look much better.  Vit d 43 and b12 795.  Continue current OTC regimen and doses.  She can continue B12 injections by nurse visit every 4  Weeks x 12 doses. We will follow levels at her physicals and reorder injections to last a year.

## 2019-01-21 ENCOUNTER — Other Ambulatory Visit: Payer: Self-pay

## 2019-01-21 ENCOUNTER — Ambulatory Visit (INDEPENDENT_AMBULATORY_CARE_PROVIDER_SITE_OTHER): Payer: BLUE CROSS/BLUE SHIELD

## 2019-01-21 DIAGNOSIS — E538 Deficiency of other specified B group vitamins: Secondary | ICD-10-CM | POA: Diagnosis not present

## 2019-01-21 MED ORDER — CYANOCOBALAMIN 1000 MCG/ML IJ SOLN
1000.0000 ug | Freq: Once | INTRAMUSCULAR | Status: AC
  Administered 2019-01-21: 1000 ug via INTRAMUSCULAR

## 2019-01-21 NOTE — Progress Notes (Addendum)
Patricia Alvarez is a 58 y.o. female presents to the office today for Monthly B12 injections, per physician's orders. Original order: 05/07/2018. Most recent order: 12/27/2018, continue B12 injections by nurse visit every 4  Weeks x 12 doses Cyanocobalamin, 1000 mcg,  IM was administered R deltoid today. Patient tolerated injection. Patient due for follow up labs/provider appt: No. Date due: 05/2019, appt made No Patient next injection due: 02/25/2019, appt made Yes  Gerilyn Nestle   Electronically Signed by: Howard Pouch, DO [place] [facility] [place]

## 2019-02-25 ENCOUNTER — Ambulatory Visit (INDEPENDENT_AMBULATORY_CARE_PROVIDER_SITE_OTHER): Payer: BLUE CROSS/BLUE SHIELD | Admitting: Family Medicine

## 2019-02-25 DIAGNOSIS — E538 Deficiency of other specified B group vitamins: Secondary | ICD-10-CM | POA: Diagnosis not present

## 2019-02-25 MED ORDER — CYANOCOBALAMIN 1000 MCG/ML IJ SOLN
1000.0000 ug | Freq: Once | INTRAMUSCULAR | Status: AC
  Administered 2019-02-25: 1000 ug via INTRAMUSCULAR

## 2019-02-25 NOTE — Progress Notes (Addendum)
Patricia Alvarez is a 58 y.o. female presents to the office today for Vitamin B12 injections, per physician's orders. Original order: 3.2.2020 - continue B12 injections by nurse visit every 4  Weeks x 12 doses Vitamin B12, 1074mcg , IM was administered right deltoid today. Patient tolerated injection. Patient due for follow up labs/provider appt: No. Date due: , appt made No Patient next injection due: 5.29.2020 , appt made Yes  Kathie Dike., [place] screening examination/treatment/procedure(s) were performed by non-physician practitioner and as supervising physician I was immediately available for consultation/collaboration.  I agree with above assessment and plan.  Electronically Signed by: Howard Pouch, DO [place] [facility] [place]

## 2019-03-25 ENCOUNTER — Ambulatory Visit: Payer: BLUE CROSS/BLUE SHIELD

## 2019-03-30 ENCOUNTER — Ambulatory Visit (INDEPENDENT_AMBULATORY_CARE_PROVIDER_SITE_OTHER): Payer: BC Managed Care – PPO | Admitting: Family Medicine

## 2019-03-30 ENCOUNTER — Other Ambulatory Visit: Payer: Self-pay

## 2019-03-30 DIAGNOSIS — E538 Deficiency of other specified B group vitamins: Secondary | ICD-10-CM | POA: Diagnosis not present

## 2019-03-30 MED ORDER — CYANOCOBALAMIN 1000 MCG/ML IJ SOLN
1000.0000 ug | Freq: Once | INTRAMUSCULAR | Status: AC
  Administered 2019-03-30: 1000 ug via INTRAMUSCULAR

## 2019-03-30 NOTE — Progress Notes (Addendum)
Patricia Alvarez is a 58 y.o. female presents to the office today for Vitamin B12 injections, per physician's orders. Original order: 12/27/2018 - B12 injections by nurse visit every 4 Weeks x 12 doses Vitamin B12, 1032mcg, IM was administered left deltoid today. Patient tolerated injection. Patient due for follow up labs/provider appt: No. Date due: , appt made No Patient next injection due: 7.1.2020, appt made Yes  Kathie Dike., CMA   Dr Anitra Lauth, Please sign off in Dr Lucita Lora absence.   Electronically Signed by: Howard Pouch, DO [place] [facility] [place]

## 2019-04-27 ENCOUNTER — Telehealth: Payer: Self-pay | Admitting: Family Medicine

## 2019-04-27 DIAGNOSIS — Z20828 Contact with and (suspected) exposure to other viral communicable diseases: Secondary | ICD-10-CM | POA: Diagnosis not present

## 2019-04-27 NOTE — Telephone Encounter (Signed)
Pt was called and she has made appt with [facility] and does not need appt

## 2019-04-27 NOTE — Telephone Encounter (Signed)
Needs COVID testing. Exposure. First available virtual visit is tomorrow at 4pm  She cancelled her appt for her B12 tomorrow.  Please advise.

## 2019-04-28 ENCOUNTER — Other Ambulatory Visit: Payer: Self-pay | Admitting: Obstetrics and Gynecology

## 2019-04-28 ENCOUNTER — Ambulatory Visit: Payer: BC Managed Care – PPO

## 2019-04-28 DIAGNOSIS — Z1231 Encounter for screening mammogram for malignant neoplasm of breast: Secondary | ICD-10-CM

## 2019-05-05 ENCOUNTER — Ambulatory Visit (INDEPENDENT_AMBULATORY_CARE_PROVIDER_SITE_OTHER): Payer: BC Managed Care – PPO | Admitting: Family Medicine

## 2019-05-05 ENCOUNTER — Other Ambulatory Visit: Payer: Self-pay

## 2019-05-05 ENCOUNTER — Encounter: Payer: Self-pay | Admitting: Family Medicine

## 2019-05-05 VITALS — BP 101/66 | HR 70 | Temp 97.7°F | Ht 65.0 in | Wt 148.6 lb

## 2019-05-05 DIAGNOSIS — G43709 Chronic migraine without aura, not intractable, without status migrainosus: Secondary | ICD-10-CM | POA: Diagnosis not present

## 2019-05-05 NOTE — Patient Instructions (Signed)
Continue rizatriptan for acute management. Take at onset of headache. May repeat 1 tablet in 2 hours if needed. If you feel this stops working call and we will change to Imitrex.   Follow up annually, sooner if needed  Rizatriptan disintegrating tablets What is this medicine? RIZATRIPTAN (rye za TRIP tan) is used to treat migraines with or without aura. An aura is a strange feeling or visual disturbance that warns you of an attack. It is not used to prevent migraines. This medicine may be used for other purposes; ask your health care provider or pharmacist if you have questions. COMMON BRAND NAME(S): Maxalt-MLT What should I tell my health care provider before I take this medicine? They need to know if you have any of these conditions:  cigarette smoker  circulation problems in fingers and toes  diabetes  heart disease  high blood pressure  high cholesterol  history of irregular heartbeat  history of stroke  kidney disease  liver disease  stomach or intestine problems  an unusual or allergic reaction to rizatriptan, other medicines, foods, dyes, or preservatives  pregnant or trying to get pregnant  breast-feeding How should I use this medicine? Take this medicine by mouth. Follow the directions on the prescription label. Leave the tablet in the sealed blister pack until you are ready to take it. With dry hands, open the blister and gently remove the tablet. If the tablet breaks or crumbles, throw it away and take a new tablet out of the blister pack. Place the tablet in the mouth and allow it to dissolve, and then swallow. Do not cut, crush, or chew this medicine. You do not need water to take this medicine. Do not take it more often than directed. Talk to your pediatrician regarding the use of this medicine in children. While this drug may be prescribed for children as young as 6 years for selected conditions, precautions do apply. Overdosage: If you think you have  taken too much of this medicine contact a poison control center or emergency room at once. NOTE: This medicine is only for you. Do not share this medicine with others. What if I miss a dose? This does not apply. This medicine is not for regular use. What may interact with this medicine? Do not take this medicine with any of the following medicines:  certain medicines for migraine headache like almotriptan, eletriptan, frovatriptan, naratriptan, rizatriptan, sumatriptan, zolmitriptan  ergot alkaloids like dihydroergotamine, ergonovine, ergotamine, methylergonovine  MAOIs like Carbex, Eldepryl, Marplan, Nardil, and Parnate This medicine may also interact with the following medications:  certain medicines for depression, anxiety, or psychotic disorders  propranolol This list may not describe all possible interactions. Give your health care provider a list of all the medicines, herbs, non-prescription drugs, or dietary supplements you use. Also tell them if you smoke, drink alcohol, or use illegal drugs. Some items may interact with your medicine. What should I watch for while using this medicine? Visit your healthcare professional for regular checks on your progress. Tell your healthcare professional if your symptoms do not start to get better or if they get worse. You may get drowsy or dizzy. Do not drive, use machinery, or do anything that needs mental alertness until you know how this medicine affects you. Do not stand up or sit up quickly, especially if you are an older patient. This reduces the risk of dizzy or fainting spells. Alcohol may interfere with the effect of this medicine. Your mouth may get dry. Chewing  sugarless gum or sucking hard candy and drinking plenty of water may help. Contact your healthcare professional if the problem does not go away or is severe. If you take migraine medicines for 10 or more days a month, your migraines may get worse. Keep a diary of headache days and  medicine use. Contact your healthcare professional if your migraine attacks occur more frequently. What side effects may I notice from receiving this medicine? Side effects that you should report to your doctor or health care professional as soon as possible:  allergic reactions like skin rash, itching or hives, swelling of the face, lips, or tongue  chest pain or chest tightness  signs and symptoms of a dangerous change in heartbeat or heart rhythm like chest pain; dizziness; fast, irregular heartbeat; palpitations; feeling faint or lightheaded; falls; breathing problems  signs and symptoms of a stroke like changes in vision; confusion; trouble speaking or understanding; severe headaches; sudden numbness or weakness of the face, arm or leg; trouble walking; dizziness; loss of balance or coordination  signs and symptoms of serotonin syndrome like irritable; confusion; diarrhea; fast or irregular heartbeat; muscle twitching; stiff muscles; trouble walking; sweating; high fever; seizures; chills; vomiting Side effects that usually do not require medical attention (report to your doctor or health care professional if they continue or are bothersome):  diarrhea  dizziness  drowsiness  dry mouth  headache  nausea, vomiting  pain, tingling, numbness in the hands or feet  stomach pain This list may not describe all possible side effects. Call your doctor for medical advice about side effects. You may report side effects to FDA at 1-800-FDA-1088. Where should I keep my medicine? Keep out of the reach of children. Store at room temperature between 15 and 30 degrees C (59 and 86 degrees F). Protect from light and moisture. Throw away any unused medicine after the expiration date. NOTE: This sheet is a summary. It may not cover all possible information. If you have questions about this medicine, talk to your doctor, pharmacist, or health care provider.  2020 Elsevier/Gold Standard  (2018-04-27 14:58:08)

## 2019-05-05 NOTE — Progress Notes (Signed)
PATIENT: Patricia Alvarez DOB: 13-Nov-1960  REASON FOR VISIT: follow up HISTORY FROM: patient  Chief Complaint  Patient presents with   Follow-up    6 mon f/u. Alone. Rm 1. No new concerns at this time. Patient stated that her last migraine was 4 weeks ago.      HISTORY OF PRESENT ILLNESS: Today 05/05/19 Patricia Alvarez is a 58 y.o. female here today for follow up for follow-up of migraines.  She continues to have approximately 1-2 migraines per month.  She reports that rizatriptan works if she catches her headaches soon enough.  She has been attempting treatment with Tylenol prior to this prescription.  She reports that sometimes she has to take 2 doses of rizatriptan to abort migraine.  Otherwise she is doing well.  No other concerns today.   HISTORY: (copied from Dr Gladstone Lighter note on 11/08/2018)  UPDATE (11/08/18, VRP): Since last visit, doing well. She reports that she has had a migraine about once or twice a month since being seen in July, 2019. She reports that the migraine is usually located behind her ear (usually right sided but can occur on the left). Pain is described as a pulsating, pressure sensation that radiates up to the top of her head. She will have light sensitivity and occasionally nausea with migraine. She can lie in a dark room with light pressure to her head and headaches improve. She has noted an unusual smell prior to migraine. She is taking Maxalt with onset of migraine and this usually helps. She feels that she usually has another migraine the following day that is relieved with Maxalt as well. She rarely takes an Excedrin for non migrainous headaches. She denies stroke like symptoms with migraine. She is contributing the improvement to treatment of vitamin B12 and vitamin D deficiency. She has been treated with B12 injections as well as oral supplements. She is also taking vitamin D and magnesium supplements OTC. She reports that her numbness and tingling in her right  hand and arm is significantly improved.   PRIOR HPI (05/03/2018, VRP): 58 year old female here for evaluation of headaches.  Patient had onset of headaches sometime in her late 21s.  She describes pounding, unilateral, mainly left-sided, throbbing severe headaches associated with nausea, blurred vision, sensitivity to sound, seeing dots and spots.  Headaches were intermittent for many years.  Patient treated these with over-the-counter medications.  The last couple of years patient has had slight change in her headaches with sharp shooting pain in her left ear, left eye, facial numbness, lip numbness, drooling sensation, arm numbness.  These also were associated with headaches.  Patient feeling headaches and pain behind her ears and behind her head.  Headaches increased in April 2019.  Now patient having headaches 3-4 times per week.  Patient had a CT scan and MRI of the brain, as well as emergency room evaluation, and was diagnosed with probable complicated migraine.  She was prescribed amitriptyline but has not tried it yet.  Patient has family history of migraine in her mother.  No specific triggering factors currently for her headaches.  Patient has a long history of somewhat interrupted sleep and mild insomnia.  She is able to fall asleep without difficulty but then tends to wake up in the middle the night and has difficulty falling back asleep.   REVIEW OF SYSTEMS: Out of a complete 14 system review of symptoms, the patient complains only of the following symptoms, headaches and all other reviewed systems  are negative.  ALLERGIES: Allergies  Allergen Reactions   Codeine Itching and Nausea Only    REACTION: nausea   Morphine Nausea Only    REACTION: nausea   Prednisone     REACTION: Hyperactive    HOME MEDICATIONS: Outpatient Medications Prior to Visit  Medication Sig Dispense Refill   aspirin-acetaminophen-caffeine (EXCEDRIN MIGRAINE) 250-250-65 MG tablet Take by mouth as  needed for headache.     cyanocobalamin (,VITAMIN B-12,) 1000 MCG/ML injection Inject 1 mL (1,000 mcg total) into the muscle every 30 (thirty) days for 12 doses. Taking every other week. 1 mL    estradiol (ESTRACE) 0.5 MG tablet Take 0.5 mg by mouth daily.  3   magnesium 30 MG tablet Take 30 mg by mouth daily.     omeprazole (PRILOSEC) 40 MG capsule Take 1 capsule (40 mg total) by mouth daily. 90 capsule 3   rizatriptan (MAXALT-MLT) 10 MG disintegrating tablet Take 1 tablet (10 mg total) by mouth as needed for migraine. May repeat in 2 hours if needed 9 tablet 12   Vitamin D, Ergocalciferol, 2000 units CAPS Take 2,000 Units by mouth daily.     No facility-administered medications prior to visit.     PAST MEDICAL HISTORY: Past Medical History:  Diagnosis Date   Allergic rhinitis    Allergy    B12 deficiency    Back pain    Chicken pox    Colon polyp 2015   TUBULAR ADENOMA (X 1).   DDD (degenerative disc disease), cervical    Endometriosis of uterus    GERD (gastroesophageal reflux disease)    Hyperlipidemia    IBS (irritable bowel syndrome)    Internal hemorrhoid    Migraines    Rectal bleeding    Vitamin D deficiency     PAST SURGICAL HISTORY: Past Surgical History:  Procedure Laterality Date   APPENDECTOMY  02/2000   and ovary   AUGMENTATION MAMMAPLASTY Bilateral 1998   Saline,retropectoral    COLONOSCOPY W/ BIOPSIES  2015   multiple   ESOPHAGOGASTRODUODENOSCOPY  2011   multiple   LAPAROSCOPY     OOPHORECTOMY Left 02/2000   w/appendectomy   PARTIAL HYSTERECTOMY  1994   right ovary and uterus   TOTAL HIP ARTHROPLASTY Right    August 31, 2018    FAMILY HISTORY: Family History  Problem Relation Age of Onset   Hyperlipidemia Mother 67   Heart failure Mother 59   Arthritis Mother    Asthma Mother    Hearing loss Mother    Heart disease Mother    Hypertension Mother    Miscarriages / [place] Mother    Colon polyps  Father    Stroke Father    Hearing loss Father    Breast cancer Maternal Grandmother 61   Colon cancer Neg Hx    Esophageal cancer Neg Hx    Rectal cancer Neg Hx    Stomach cancer Neg Hx     SOCIAL HISTORY: Social History   Socioeconomic History   Marital status: Married    Spouse name: Not on file   Number of children: 2   Years of education: 12   Highest education level: Not on file  Occupational History   Occupation: Forensic psychologist: [employer] [place] resource strain: Not on file   Food insecurity    Worry: Not on file    Inability: Not on file   Transportation needs    Medical: Not on  file    Non-medical: Not on file  Tobacco Use   Smoking status: Never Smoker   Smokeless tobacco: Never Used  Substance and Sexual Activity   Alcohol use: No   Drug use: No   Sexual activity: Yes    Partners: Male    Birth control/protection: Surgical  Lifestyle   Physical activity    Days per week: Not on file    Minutes per session: Not on file   Stress: Not on file  Relationships   Social connections    Talks on phone: Not on file    Gets together: Not on file    Attends religious service: Not on file    Active member of club or organization: Not on file    Attends meetings of clubs or organizations: Not on file    Relationship status: Not on file   Intimate partner violence    Fear of current or ex partner: Not on file    Emotionally abused: Not on file    Physically abused: Not on file    Forced sexual activity: Not on file  Other Topics Concern   Not on file  Social History Narrative   Married. 2 children.    HS graduate. Employed as a Herbalist.    Uses bicycle helmet, smoke alarm in the home.   Feels safe in her relationships.   Caffeine- unsweet 1 quart most days.       PHYSICAL EXAM  Vitals:   05/05/19 1521  BP: 101/66  Pulse: 70  Temp: 97.7 F (36.5 C)  TempSrc: Oral  Weight: 148  lb 9.6 oz (67.4 kg)  Height: 5\' 5"  (1.651 m)   Body mass index is 24.73 kg/m.  Generalized: Well developed, in no acute distress  Cardiology: normal rate and rhythm, no murmur noted Neurological examination  Mentation: Alert oriented to time, place, history taking. Follows all commands speech and language fluent Cranial nerve II-XII: Pupils were equal round reactive to light. Extraocular movements were full, visual field were full on confrontational test. Facial sensation and strength were normal. Uvula tongue midline. Head turning and shoulder shrug  were normal and symmetric. Motor: The motor testing reveals 5 over 5 strength of all 4 extremities. Good symmetric motor tone is noted throughout.  Coordination: Cerebellar testing reveals good finger-nose-finger and heel-to-shin bilaterally.  Gait and station: Gait is normal.    DIAGNOSTIC DATA (LABS, IMAGING, TESTING) - I reviewed patient records, labs, notes, testing and imaging myself where available.  No flowsheet data found.   Lab Results  Component Value Date   WBC 5.7 07/28/2018   HGB 13.4 07/28/2018   HCT 39.9 07/28/2018   MCV 95.6 07/28/2018   PLT 268.0 07/28/2018      Component Value Date/Time   NA 138 07/28/2018 1603   K 3.9 07/28/2018 1603   CL 102 07/28/2018 1603   CO2 32 07/28/2018 1603   GLUCOSE 87 07/28/2018 1603   BUN 12 07/28/2018 1603   CREATININE 0.68 07/28/2018 1603   CALCIUM 9.3 07/28/2018 1603   PROT 6.3 07/28/2018 1603   ALBUMIN 4.0 07/28/2018 1603   AST 19 07/28/2018 1603   ALT 11 07/28/2018 1603   ALKPHOS 53 07/28/2018 1603   BILITOT 0.3 07/28/2018 1603   GFRNONAA >60 04/15/2018 1137   GFRAA >60 04/15/2018 1137   Lab Results  Component Value Date   CHOL 217 (HH) 01/06/2008   HDL 37.4 (L) 01/06/2008   LDLDIRECT 168.7 01/06/2008   TRIG 79  01/06/2008   CHOLHDL 5.8 CALC 01/06/2008   Lab Results  Component Value Date   HGBA1C 5.4 07/28/2018   Lab Results  Component Value Date    HUOHFGBM21 115 12/24/2018   Lab Results  Component Value Date   TSH 2.40 02/17/2018     ASSESSMENT AND PLAN 58 y.o. year old female  has a past medical history of Allergic rhinitis, Allergy, B12 deficiency, Back pain, Chicken pox, Colon polyp (2015), DDD (degenerative disc disease), cervical, Endometriosis of uterus, GERD (gastroesophageal reflux disease), Hyperlipidemia, IBS (irritable bowel syndrome), Internal hemorrhoid, Migraines, Rectal bleeding, and Vitamin D deficiency. here with     ICD-10-CM   1. Chronic migraine without aura without status migrainosus, not intractable  G43.709     Migraines are stable at this time.  We have discussed the need to take rizatriptan as soon as she notes onset of migraine.  We have also discussed trying Imitrex in the future if she feels that rizatriptan stops working.  She may take Tylenol if needed but was advised against regular use.  She will follow-up closely for any worsening.  Annual follow-up advised, sooner if needed.  She verbalizes understanding and agreement this plan.   No orders of the defined types were placed in this encounter.    No orders of the defined types were placed in this encounter.     I spent 15 minutes with the patient. 50% of this time was spent counseling and educating patient on plan of care and medications.    Debbora Presto, FNP-C 05/05/2019, 8:52 PM [facility] [address], [place], [place] [address] [phone]

## 2019-05-09 ENCOUNTER — Ambulatory Visit: Payer: BLUE CROSS/BLUE SHIELD | Admitting: Family Medicine

## 2019-05-09 NOTE — Progress Notes (Signed)
Pt with vit B12 deficiency.  Agree with vit B12 1000 mcg IM in office today. Signed:  Crissie Sickles, MD           05/09/2019

## 2019-05-10 NOTE — Progress Notes (Signed)
I reviewed note and agree with plan.   Penni Bombard, MD 5/83/4621, 94:71 AM Certified in Neurology, Neurophysiology and Neuroimaging  [facility] [address] [place], [place] [address] [phone]

## 2019-05-11 ENCOUNTER — Other Ambulatory Visit: Payer: Self-pay

## 2019-05-11 ENCOUNTER — Ambulatory Visit (INDEPENDENT_AMBULATORY_CARE_PROVIDER_SITE_OTHER): Payer: BC Managed Care – PPO | Admitting: Family Medicine

## 2019-05-11 DIAGNOSIS — E538 Deficiency of other specified B group vitamins: Secondary | ICD-10-CM | POA: Diagnosis not present

## 2019-05-11 MED ORDER — CYANOCOBALAMIN 1000 MCG/ML IJ SOLN
1000.0000 ug | Freq: Once | INTRAMUSCULAR | Status: AC
  Administered 2019-05-11: 15:00:00 1000 ug via INTRAMUSCULAR

## 2019-05-11 NOTE — Progress Notes (Addendum)
Patricia Alvarez is a 58 y.o. female presents to the office today for Vitamin B12 injections, per physician's orders. Original order:  12/27/2018 - B12 injections by nurse visit every 4 Weeks x 12 doses Vitamin B12, 1066mcg, IM was administered left deltoid today. Patient tolerated injection. Patient due for follow up labs/provider appt: No. Date due: n/a, appt made No Patient next injection due: 06/09/2019, appt made Yes  Kathie Dike., [place] screening examination/treatment/procedure(s) were performed by non-physician practitioner and as supervising physician I was immediately available for consultation/collaboration.  I agree with above assessment and plan.  Electronically Signed by: Howard Pouch, DO [facility] [place]

## 2019-05-13 ENCOUNTER — Ambulatory Visit: Payer: BC Managed Care – PPO

## 2019-05-16 DIAGNOSIS — H524 Presbyopia: Secondary | ICD-10-CM | POA: Diagnosis not present

## 2019-05-18 ENCOUNTER — Encounter: Payer: Self-pay | Admitting: Family Medicine

## 2019-05-18 DIAGNOSIS — G43909 Migraine, unspecified, not intractable, without status migrainosus: Secondary | ICD-10-CM | POA: Insufficient documentation

## 2019-05-18 DIAGNOSIS — Z6824 Body mass index (BMI) 24.0-24.9, adult: Secondary | ICD-10-CM | POA: Diagnosis not present

## 2019-05-18 DIAGNOSIS — Z01419 Encounter for gynecological examination (general) (routine) without abnormal findings: Secondary | ICD-10-CM | POA: Diagnosis not present

## 2019-05-24 DIAGNOSIS — Z13220 Encounter for screening for lipoid disorders: Secondary | ICD-10-CM | POA: Diagnosis not present

## 2019-05-24 DIAGNOSIS — Z13228 Encounter for screening for other metabolic disorders: Secondary | ICD-10-CM | POA: Diagnosis not present

## 2019-05-25 LAB — LIPID PANEL
Cholesterol: 218 — AB (ref 0–200)
HDL: 51 (ref 35–70)
LDL Cholesterol: 138
Triglycerides: 147 (ref 40–160)

## 2019-05-25 LAB — HEPATIC FUNCTION PANEL
ALT: 5 — AB (ref 7–35)
AST: 14 (ref 13–35)
Alkaline Phosphatase: 65 (ref 25–125)
Bilirubin, Total: 0.4

## 2019-05-25 LAB — HM PAP SMEAR: HM Pap smear: NORMAL

## 2019-05-25 LAB — BASIC METABOLIC PANEL
BUN: 11 (ref 4–21)
Creatinine: 0.7 (ref 0.5–1.1)
Potassium: 4.2 (ref 3.4–5.3)
Sodium: 143 (ref 137–147)

## 2019-05-25 LAB — CBC AND DIFFERENTIAL: Hemoglobin: 14 (ref 12.0–16.0)

## 2019-06-09 ENCOUNTER — Ambulatory Visit: Payer: BC Managed Care – PPO

## 2019-06-13 ENCOUNTER — Ambulatory Visit (INDEPENDENT_AMBULATORY_CARE_PROVIDER_SITE_OTHER): Payer: BC Managed Care – PPO

## 2019-06-13 ENCOUNTER — Other Ambulatory Visit: Payer: Self-pay

## 2019-06-13 DIAGNOSIS — M4722 Other spondylosis with radiculopathy, cervical region: Secondary | ICD-10-CM | POA: Diagnosis not present

## 2019-06-13 DIAGNOSIS — E538 Deficiency of other specified B group vitamins: Secondary | ICD-10-CM | POA: Diagnosis not present

## 2019-06-13 DIAGNOSIS — M5412 Radiculopathy, cervical region: Secondary | ICD-10-CM | POA: Diagnosis not present

## 2019-06-13 DIAGNOSIS — M5030 Other cervical disc degeneration, unspecified cervical region: Secondary | ICD-10-CM | POA: Diagnosis not present

## 2019-06-13 DIAGNOSIS — M542 Cervicalgia: Secondary | ICD-10-CM | POA: Diagnosis not present

## 2019-06-13 MED ORDER — CYANOCOBALAMIN 1000 MCG/ML IJ SOLN
1000.0000 ug | Freq: Once | INTRAMUSCULAR | Status: AC
  Administered 2019-06-13: 1000 ug via INTRAMUSCULAR

## 2019-06-13 NOTE — Progress Notes (Signed)
Patricia Alvarez is a 58 y.o. female presents to the office today for Vitamin B12 injections, per physician's orders. Original order: 12/27/2018 -B12 injections by nurse visit every 4 Weeks x 12 doses Vitamin B12, 1047mcg, IM was administered left deltoid today. Patient tolerated injection. Patient due for follow up labs/provider appt: No. Date due: n/a, appt made No Patient next injection due: 07/11/19 , appt made: no  Lillyona Polasek, CMA

## 2019-06-16 ENCOUNTER — Other Ambulatory Visit: Payer: Self-pay

## 2019-06-16 ENCOUNTER — Ambulatory Visit
Admission: RE | Admit: 2019-06-16 | Discharge: 2019-06-16 | Disposition: A | Discharge status: Home / Self Care | Payer: BC Managed Care – PPO | Source: Ambulatory Visit | Attending: Obstetrics and Gynecology | Admitting: Obstetrics and Gynecology

## 2019-06-16 DIAGNOSIS — Z1231 Encounter for screening mammogram for malignant neoplasm of breast: Secondary | ICD-10-CM

## 2019-06-21 ENCOUNTER — Other Ambulatory Visit: Payer: Self-pay

## 2019-06-21 ENCOUNTER — Ambulatory Visit (INDEPENDENT_AMBULATORY_CARE_PROVIDER_SITE_OTHER): Payer: BC Managed Care – PPO | Admitting: Family Medicine

## 2019-06-21 ENCOUNTER — Encounter: Payer: Self-pay | Admitting: Family Medicine

## 2019-06-21 VITALS — BP 108/79 | Temp 97.9°F | Resp 18 | Ht 65.0 in | Wt 145.0 lb

## 2019-06-21 DIAGNOSIS — R0789 Other chest pain: Secondary | ICD-10-CM

## 2019-06-21 DIAGNOSIS — R066 Hiccough: Secondary | ICD-10-CM | POA: Insufficient documentation

## 2019-06-21 DIAGNOSIS — R0681 Apnea, not elsewhere classified: Secondary | ICD-10-CM | POA: Insufficient documentation

## 2019-06-21 DIAGNOSIS — R5383 Other fatigue: Secondary | ICD-10-CM | POA: Diagnosis not present

## 2019-06-21 MED ORDER — FAMOTIDINE 20 MG PO TABS: 20.0000 mg | ORAL_TABLET | Freq: Two times a day (BID) | ORAL | 1 refills | Status: DC

## 2019-06-21 NOTE — Progress Notes (Signed)
VIRTUAL VISIT VIA VIDEO  I connected with Patricia Alvarez on 06/21/19 at  4:00 PM EDT by a video enabled telemedicine application and verified that I am speaking with the correct person using two identifiers. Location patient: Home Location provider: [facility], Office Persons participating in the virtual visit: Patient, Dr. Raoul Pitch and R.Baker, LPN  I discussed the limitations of evaluation and management by telemedicine and the availability of in person appointments. The patient expressed understanding and agreed to proceed.   SUBJECTIVE Chief Complaint  Patient presents with   Chest Pain    Pt has woken up twice over the past 6 weeks and feels as though she is having chest pain. Pt states its like a  "jolt" and then has the hiccups right after. Chest is sore the rest of the day.     HPI: Patricia Alvarez is a 58 y.o. female present for a chest discomfort intermittently over 6 weeks.  Patient reports over the last 2 weeks she has had 2 occurrences that have awaken her from sleep with chest discomfort.  She states she feels like she stops breathing in her sleep and wakes with chest pain on the left side of her chest.  She states it wakes her like a "jolt" and she has hiccups for a very brief period after awakening.  Of note she states she has had chronic cups in the middle the night for 10-12 years that will awaken her and she has a drink of water or orange juice and they resolve immediately.  She reports the left side of her chest can feel sore the day  after these events.  Patient's BMI is less than 25.  She does not snore.  She has a history of severe reflux.  Her last EGD was in 2011 and reported as normal.  She reports compliance with omeprazole 40 mg daily.  She denies any dietary changes over the course of this time.  She denies any possible food triggers the night of the events.  She states the events always occur right around midnight to 1230.  She denies everyday daytime somnolence  but does endorse more chronic fatigue.   ROS: See pertinent positives and negatives per HPI.  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   Post-operative nausea and vomiting 07/28/2018   Hypoesthesia of skin 02/17/2018   Encounter for long-term (current) use of medications 02/17/2018   Chronic nonintractable headache 02/17/2018   Neuralgia 02/17/2018   Abnormal hearing test 02/17/2018   Cervical spondylosis with radiculopathy 02/17/2018   Foraminal stenosis of cervical region 02/17/2018   DDD (degenerative disc disease), cervical    Migraines    Hx of adenomatous colonic polyps 01/12/2017   GERD 10/02/2009   HYPERLIPIDEMIA-MIXED 02/14/2009   Vitamin D deficiency 11/23/2008   B12 deficiency 10/07/2007    Social History   Tobacco Use   Smoking status: Never Smoker   Smokeless tobacco: Never Used  Substance Use Topics   Alcohol use: No    Current Outpatient Medications:    aspirin-acetaminophen-caffeine (EXCEDRIN MIGRAINE) 250-250-65 MG tablet, Take by mouth as needed for headache., Disp: , Rfl:    cyanocobalamin (,VITAMIN B-12,) 1000 MCG/ML injection, Inject 1 mL (1,000 mcg total) into the muscle every 30 (thirty) days for 12 doses. Taking every other week., Disp: 1 mL, Rfl:    estradiol (ESTRACE) 0.5 MG tablet, Take 0.5 mg by mouth daily., Disp: , Rfl: 3   omeprazole (  PRILOSEC) 40 MG capsule, Take 1 capsule (40 mg total) by mouth daily., Disp: 90 capsule, Rfl: 3   rizatriptan (MAXALT-MLT) 10 MG disintegrating tablet, Take 1 tablet (10 mg total) by mouth as needed for migraine. May repeat in 2 hours if needed, Disp: 9 tablet, Rfl: 12   vitamin B-12 (CYANOCOBALAMIN) 1000 MCG tablet, Take 1,000 mcg by mouth daily., Disp: , Rfl:    Vitamin D, Ergocalciferol, 2000 units CAPS, Take 2,000 Units by mouth daily., Disp: , Rfl:    magnesium 30 MG tablet, Take 30 mg by mouth daily., Disp: , Rfl:   Allergies  Allergen  Reactions   Codeine Itching and Nausea Only    REACTION: nausea   Morphine Nausea Only    REACTION: nausea   Prednisone     REACTION: Hyperactive    OBJECTIVE: BP 108/79    Temp 97.9 F (36.6 C) (Oral)    Resp 18    Ht 5\' 5"  (1.651 m)    Wt 145 lb (65.8 kg)    BMI 24.13 kg/m  Gen: No acute distress. Nontoxic in appearance.  HENT: AT. [place].  MMM.  Eyes:Pupils Equal Round Reactive to light, Extraocular movements intact,  Conjunctiva without redness, discharge or icterus. CV: no edema Chest: Cough or shortness of breath not present.   Neuro:  Normal gait. Alert. Oriented x3  Psych: Normal affect, dress and demeanor. Normal speech. Normal thought content and judgment.  ASSESSMENT AND PLAN: Patricia Alvarez is a 58 y.o. female present for  Chest discomfort/Witnessed apneic spells/fatigue/Hiccups - lengthy discussion surrounding possible causes of her symptoms with her today. DDX: sleep apnea, sleep disorder, hiatal hernia +/- worsening reflux, diaphragmatic hernia/paralysis. - suspect diaphragmatic irritation as cause either>> hernia related or worsening reflux or other diaphragmatic etiology- especially with brief onset of  Hiccups after awakening with symptoms and residual chest soreness after event.  -  Less likely to be cardiac in nature given symptoms- but can not rule out. She did set appt with her cardiologist next week and I encourage her to keep the appt.  - of interest CT ABD 2016 reports extrarenal pelvis on the left.  - DG Chest 2 View; Future - Ambulatory referral to Neurology ( est w. [facility] already>> if able to evaluate for sleep apnea/disturbacne would be ideal>>If not, will refer to pulm)   > 25 minutes spent with patient, >50% of time spent face to face    Howard Pouch, DO 06/21/2019

## 2019-06-22 ENCOUNTER — Ambulatory Visit: Payer: BC Managed Care – PPO | Admitting: Family Medicine

## 2019-06-23 ENCOUNTER — Other Ambulatory Visit: Payer: Self-pay

## 2019-06-23 ENCOUNTER — Ambulatory Visit (INDEPENDENT_AMBULATORY_CARE_PROVIDER_SITE_OTHER)
Admission: RE | Admit: 2019-06-23 | Discharge: 2019-06-23 | Disposition: A | Discharge status: Home / Self Care | Payer: BC Managed Care – PPO | Source: Ambulatory Visit | Attending: Family Medicine | Admitting: Family Medicine

## 2019-06-23 ENCOUNTER — Ambulatory Visit (INDEPENDENT_AMBULATORY_CARE_PROVIDER_SITE_OTHER): Payer: BC Managed Care – PPO | Admitting: Neurology

## 2019-06-23 ENCOUNTER — Encounter: Payer: Self-pay | Admitting: Neurology

## 2019-06-23 VITALS — BP 104/71 | HR 70 | Ht 65.0 in | Wt 148.0 lb

## 2019-06-23 DIAGNOSIS — R51 Headache: Secondary | ICD-10-CM

## 2019-06-23 DIAGNOSIS — R0789 Other chest pain: Secondary | ICD-10-CM | POA: Diagnosis not present

## 2019-06-23 DIAGNOSIS — R079 Chest pain, unspecified: Secondary | ICD-10-CM

## 2019-06-23 DIAGNOSIS — G4719 Other hypersomnia: Secondary | ICD-10-CM | POA: Diagnosis not present

## 2019-06-23 DIAGNOSIS — R519 Headache, unspecified: Secondary | ICD-10-CM

## 2019-06-23 DIAGNOSIS — R066 Hiccough: Secondary | ICD-10-CM | POA: Diagnosis not present

## 2019-06-23 DIAGNOSIS — K219 Gastro-esophageal reflux disease without esophagitis: Secondary | ICD-10-CM

## 2019-06-23 NOTE — Patient Instructions (Addendum)
Thank you for choosing [facility] for your sleep related care! It was nice to meet you today! I appreciate that you entrust me with your sleep related healthcare concerns. I hope, I was able to address at least some of your concerns today, and that I can help you feel reassured and also get better.    Here is what we discussed today and what we came up with as our plan for you:  1. It is a good idea to get checked up by cardiology.  You have an appointment pending.  2. Based on your symptoms and your exam I believe you could be at some risk for obstructive sleep apnea (aka OSA), and I think we should proceed with a sleep study to determine whether you do or do not have OSA and how severe it is. Even, if you have mild OSA, I may want you to consider treatment with CPAP, as treatment of even borderline or mild sleep apnea can result and improvement of symptoms such as sleep disruption, daytime sleepiness, nighttime bathroom breaks, restless leg symptoms, improvement of headache syndromes, even improved mood disorder.   Please remember, the long-term risks and ramifications of untreated moderate to severe obstructive sleep apnea are: increased Cardiovascular disease, including congestive heart failure, stroke, difficult to control hypertension, treatment resistant obesity, arrhythmias, especially irregular heartbeat commonly known as A. Fib. (atrial fibrillation); even type 2 diabetes has been linked to untreated OSA.   Sleep apnea can cause disruption of sleep and sleep deprivation in most cases, which, in turn, can cause recurrent headaches, problems with memory, mood, concentration, focus, and vigilance. Most people with untreated sleep apnea report excessive daytime sleepiness, which can affect their ability to drive. Please do not drive if you feel sleepy. Patients with sleep apnea developed difficulty initiating and maintaining sleep (aka insomnia).   Having sleep apnea may  increase your risk for other sleep disorders, including involuntary behaviors sleep such as sleep terrors, sleep talking, sleepwalking.    Having sleep apnea can also increase your risk for restless leg syndrome and leg movements at night.   Please note that untreated obstructive sleep apnea may carry additional perioperative morbidity. Patients with significant obstructive sleep apnea (typically, in the moderate to severe degree) should receive, if possible, perioperative PAP (positive airway pressure) therapy and the surgeons and particularly the anesthesiologists should be informed of the diagnosis and the severity of the sleep disordered breathing.   I will likely see you back after your sleep study to go over the test results and where to go from there. We will call you after your sleep study to advise about the results (most likely, you will hear from [place], my nurse) and to set up an appointment at the time, as necessary.    Our sleep lab administrative assistant will call you to schedule your sleep study and give you further instructions, regarding the check in process for the sleep study, arrival time, what to bring, when you can expect to leave after the study, etc., and to answer any other logistical questions you may have. If you don't hear back from her by about 2 weeks from now, please feel free to call her direct line at [phone] or you can call our general clinic number, or email [place] through My Chart.

## 2019-06-23 NOTE — Progress Notes (Signed)
Subjective:    Patient ID: Patricia Alvarez is a 58 y.o. female.  HPI     Star Age, MD, PhD [facility] [address], [address] [place], [place] [address]  Dear Dr. Raoul Pitch,   I saw your patient, Patricia Alvarez, upon your kind request to my sleep clinic today for initial consultation of her sleep disorder, in particular, concern for underlying sleep disordered breathing.  The patient is unaccompanied today.  As you know, Patricia Alvarez is a 58 year old right-handed woman with an underlying medical history of migraines, degenerative disc disease, reflux disease, hyperlipidemia, vitamin D deficiency, B12 deficiency and history of hip arthritis, who reports recent onset of nighttime chest pain.  She has woken up with severe reflux as well, but she has had reflux for years, she sees Dr. Fuller Plan for GI.  She has been told to take omeprazole twice daily, typically she has been only taking it once a day in the morning.  She has had intermittent nighttime hiccups for years, probably 15 years.  She had 2 episodes recently of waking up with a startle or jolts and chest pain, followed by hiccups.  She denies any chest pain with exertion or during wakefulness.  She denies any palpitations, she denies shortness of breath.  She has had the occasional morning headache and occasional nocturia, not nightly.  She is not aware of any family history of OSA.  Her mom has congestive heart failure.  Patient lives with her husband.  She works as a Radio broadcast assistant, Actuary.  She has 2 grown children and 6 grandchildren.  She does not typically snore, only rare per husband's report.  She is a non-smoker and does not drink alcohol and does not drink caffeine with the exception of 2 glasses of unsweetened tea in the mornings.  Bedtime is generally around 10 and she does not have any trouble falling asleep or staying asleep generally speaking.  Her weight has increased a little bit.  She has gained  about 10 pounds in the recent past.  I reviewed your office note from 06/21/2019.  She has also been referred to cardiology.  She sees Dr. Leta Baptist and Debbora Presto, NP for her migraines.  They do not have any pets in the household.  They have a TV in the bedroom but it is rarely on at night.  Her Past Medical History Is Significant For: Past Medical History:  Diagnosis Date  . Allergic rhinitis   . Allergy   . B12 deficiency   . Back pain   . Chicken pox   . Colon polyp 2015   TUBULAR ADENOMA (X 1).  . DDD (degenerative disc disease), cervical   . Endometriosis of uterus   . GERD (gastroesophageal reflux disease)   . Hyperlipidemia   . IBS (irritable bowel syndrome)   . Internal hemorrhoid   . Migraines   . Rectal bleeding   . Vitamin D deficiency     Her Past Surgical History Is Significant For: Past Surgical History:  Procedure Laterality Date  . APPENDECTOMY  02/2000   and ovary  . AUGMENTATION MAMMAPLASTY Bilateral 1998   Saline,retropectoral   . COLONOSCOPY W/ BIOPSIES  2015   multiple  . ESOPHAGOGASTRODUODENOSCOPY  2011   multiple  . LAPAROSCOPY    . OOPHORECTOMY Left 02/2000   w/appendectomy  . PARTIAL HYSTERECTOMY  1994   right ovary and uterus  . TOTAL HIP ARTHROPLASTY Right    August 31, 2018  Her Family History Is Significant For: Family History  Problem Relation Age of Onset  . Hyperlipidemia Mother 85  . Heart failure Mother 54  . Arthritis Mother   . Asthma Mother   . Hearing loss Mother   . Heart disease Mother   . Hypertension Mother   . Miscarriages / [place] Mother   . Colon polyps Father   . Stroke Father   . Hearing loss Father   . Breast cancer Maternal Grandmother 49  . Colon cancer Neg Hx   . Esophageal cancer Neg Hx   . Rectal cancer Neg Hx   . Stomach cancer Neg Hx     Her Social History Is Significant For: Social History   Socioeconomic History  . Marital status: Married    Spouse name: Not on file  . Number of  children: 2  . Years of education: 45  . Highest education level: Not on file  Occupational History  . Occupation: Forensic psychologist: [employer]  . Financial resource strain: Not on file  . Food insecurity    Worry: Not on file    Inability: Not on file  . Transportation needs    Medical: Not on file    Non-medical: Not on file  Tobacco Use  . Smoking status: Never Smoker  . Smokeless tobacco: Never Used  Substance and Sexual Activity  . Alcohol use: No  . Drug use: No  . Sexual activity: Yes    Partners: Male    Birth control/protection: Surgical  Lifestyle  . Physical activity    Days per week: Not on file    Minutes per session: Not on file  . Stress: Not on file  Relationships  . Social Herbalist on phone: Not on file    Gets together: Not on file    Attends religious service: Not on file    Active member of club or organization: Not on file    Attends meetings of clubs or organizations: Not on file    Relationship status: Not on file  Other Topics Concern  . Not on file  Social History Narrative   Married. 2 children.    HS graduate. Employed as a Herbalist.    Uses bicycle helmet, smoke alarm in the home.   Feels safe in her relationships.   Caffeine- unsweet 1 quart most days.     Her Allergies Are:  Allergies  Allergen Reactions  . Codeine Itching and Nausea Only    REACTION: nausea  . Morphine Nausea Only    REACTION: nausea  . Prednisone     REACTION: Hyperactive  :   Her Current Medications Are:  Outpatient Encounter Medications as of 06/23/2019  Medication Sig  . aspirin-acetaminophen-caffeine (EXCEDRIN MIGRAINE) 250-250-65 MG tablet Take by mouth as needed for headache.  . cyanocobalamin (,VITAMIN B-12,) 1000 MCG/ML injection Inject 1 mL (1,000 mcg total) into the muscle every 30 (thirty) days for 12 doses. Taking every other week.  . estradiol (ESTRACE) 0.5 MG tablet Take 0.5 mg by mouth daily.  .  famotidine (PEPCID) 20 MG tablet Take 1 tablet (20 mg total) by mouth 2 (two) times daily.  . magnesium 30 MG tablet Take 30 mg by mouth daily.  Marland Kitchen omeprazole (PRILOSEC) 40 MG capsule Take 1 capsule (40 mg total) by mouth daily.  . rizatriptan (MAXALT-MLT) 10 MG disintegrating tablet Take 1 tablet (10 mg total) by mouth as needed for migraine.  May repeat in 2 hours if needed  . vitamin B-12 (CYANOCOBALAMIN) 1000 MCG tablet Take 1,000 mcg by mouth daily.  . Vitamin D, Ergocalciferol, 2000 units CAPS Take 2,000 Units by mouth daily.   No facility-administered encounter medications on file as of 06/23/2019.   :  Review of Systems:  Out of a complete 14 point review of systems, all are reviewed and negative with the exception of these symptoms as listed below: Review of Systems  Neurological:       Pt presents today to discuss her sleep. Pt has never had a sleep study and does not endorse snoring. Pt wakes up with hiccups during the night and occasional chest discomfort.  Epworth Sleepiness Scale 0= would never doze 1= slight chance of dozing 2= moderate chance of dozing 3= high chance of dozing  Sitting and reading: 3 Watching TV: 1 Sitting inactive in a public place (ex. Theater or meeting): 0 As a passenger in a car for an hour without a break: 3 Lying down to rest in the afternoon: 2 Sitting and talking to someone: 0 Sitting quietly after lunch (no alcohol): 2 In a car, while stopped in traffic: 0 Total: 11     Objective:  Neurological Exam  Physical Exam Physical Examination:   Vitals:   06/23/19 0921  BP: 104/71  Pulse: 70    General Examination: The patient is a very pleasant 58 y.o. female in no acute distress. She appears well-developed and well-nourished and well groomed.   HEENT: Normocephalic, atraumatic, pupils are equal, round and reactive to light. Extraocular tracking is good without limitation to gaze excursion or nystagmus noted. Normal smooth pursuit is  noted. Hearing is grossly intact. Tympanic membranes are clear bilaterally. Face is symmetric with normal facial animation and normal facial sensation. Speech is clear with no dysarthria noted. There is no hypophonia. There is no lip, neck/head, jaw or voice tremor. Neck is supple with full range of passive and active motion. There are no carotid bruits on auscultation. Oropharynx exam reveals: mild mouth dryness, good dental hygiene and mild airway crowding, due to Small airway entry, tonsils are small, Mallampati class II, tongue protrudes centrally in palate elevates symmetrically, neck circumference is 14-1/8 inches.  Chest: Clear to auscultation without wheezing, rhonchi or crackles noted.  Heart: S1+S2+0, regular and normal without murmurs, rubs or gallops noted.   Abdomen: Soft, non-tender and non-distended with normal bowel sounds appreciated on auscultation.  Extremities: There is no pitting edema in the distal lower extremities bilaterally. Pedal pulses are intact.  Skin: Warm and dry without trophic changes noted. There are no varicose veins.  Musculoskeletal: exam reveals no obvious joint deformities, tenderness or joint swelling or erythema.   Neurologically:  Mental status: The patient is awake, alert and oriented in all 4 spheres. Her immediate and remote memory, attention, language skills and fund of knowledge are appropriate. There is no evidence of aphasia, agnosia, apraxia or anomia. Speech is clear with normal prosody and enunciation. Thought process is linear. Mood is normal and affect is normal.  Cranial nerves II - XII are as described above under HEENT exam.  Motor exam: Normal bulk, strength and tone is noted. There is no drift, or tremor. Romberg is negative. Reflexes are 1+ throughout. Fine motor skills and coordination: intact with normal finger taps, normal hand movements, normal rapid alternating patting.  Cerebellar testing: No dysmetria or intention tremor on finger  to nose testing. There is no truncal or gait ataxia.  Sensory  exam: intact to light touch in the upper and lower extremities.  Gait, station and balance: She stands easily. No veering to one side is noted. No leaning to one side is noted. Posture is age-appropriate and stance is narrow based. (Right hip height slightly higher?) Gait shows normal stride length and normal pace. No problems turning are noted. Tandem walk is unremarkable.   Assessment and Plan:  In summary, ERAINA BRANN is a very pleasant 58 y.o.-year old female  with an underlying medical history of migraines, degenerative disc disease, reflux disease, hyperlipidemia, vitamin D deficiency, B12 deficiency and history of hip arthritis, whoPresents for sleep evaluation.  She has in the recent past woken up with chest pain followed by hiccups.  She has a longstanding history of intermittent nocturnal hiccups.  She also has a history of nocturnal reflux.  She is in the process of seeing cardiology.  While she does not have a telltale history concerning for obstructive sleep apnea, given her symptom constellation it would be very reasonable to proceed with a sleep study.  She is willing to pursue this.  I explained the sleep test procedure to the patient. I recommended the following at this time: sleep study. We will consider PAP therapy after testing, if she qualifies. She is scheduled to see cardiology.  I answered all her questions today and the patient was in agreement. I plan to see her back after the sleep study is completed and encouraged her to call with any interim questions, concerns, problems or updates.   Thank you very much for allowing me to participate in the care of this nice patient. If I can be of any further assistance to you please do not hesitate to call me at [phone].  Sincerely,   Star Age, MD, PhD

## 2019-06-24 ENCOUNTER — Telehealth: Payer: Self-pay | Admitting: Family Medicine

## 2019-06-24 NOTE — Telephone Encounter (Signed)
Pt was called and given results. She stated she may go ahead and call GI to discuss with them because they may have further out appts with COVID. She would go through with other tests and appts

## 2019-06-24 NOTE — Telephone Encounter (Signed)
Please inform patient the following information: CXR is normal.  I seen she has been to see neuro and they are moving ahead with sleep study.  She also an appt with cardio.  I would suggest seeing what their findings are before moving forward with other test, but I suspect this is GI related and feel she will eventually need to discuss with her GI test are negative and the addition of pepcid is not helpful.

## 2019-06-30 ENCOUNTER — Ambulatory Visit: Payer: BC Managed Care – PPO | Admitting: Cardiovascular Disease

## 2019-07-05 ENCOUNTER — Institutional Professional Consult (permissible substitution): Payer: BC Managed Care – PPO | Admitting: Neurology

## 2019-07-06 ENCOUNTER — Ambulatory Visit (INDEPENDENT_AMBULATORY_CARE_PROVIDER_SITE_OTHER): Payer: BC Managed Care – PPO | Admitting: Neurology

## 2019-07-06 DIAGNOSIS — K219 Gastro-esophageal reflux disease without esophagitis: Secondary | ICD-10-CM

## 2019-07-06 DIAGNOSIS — R0683 Snoring: Secondary | ICD-10-CM

## 2019-07-06 DIAGNOSIS — G4719 Other hypersomnia: Secondary | ICD-10-CM

## 2019-07-06 DIAGNOSIS — G4710 Hypersomnia, unspecified: Secondary | ICD-10-CM | POA: Diagnosis not present

## 2019-07-06 DIAGNOSIS — R066 Hiccough: Secondary | ICD-10-CM

## 2019-07-06 DIAGNOSIS — R519 Headache, unspecified: Secondary | ICD-10-CM

## 2019-07-06 DIAGNOSIS — R079 Chest pain, unspecified: Secondary | ICD-10-CM

## 2019-07-13 ENCOUNTER — Encounter: Payer: Self-pay | Admitting: Family Medicine

## 2019-07-13 ENCOUNTER — Ambulatory Visit (INDEPENDENT_AMBULATORY_CARE_PROVIDER_SITE_OTHER): Payer: BC Managed Care – PPO | Admitting: Family Medicine

## 2019-07-13 ENCOUNTER — Other Ambulatory Visit: Payer: Self-pay

## 2019-07-13 VITALS — BP 93/65 | HR 65 | Temp 97.5°F | Resp 18 | Ht 65.0 in | Wt 146.2 lb

## 2019-07-13 DIAGNOSIS — E782 Mixed hyperlipidemia: Secondary | ICD-10-CM | POA: Diagnosis not present

## 2019-07-13 DIAGNOSIS — Z0000 Encounter for general adult medical examination without abnormal findings: Secondary | ICD-10-CM | POA: Diagnosis not present

## 2019-07-13 DIAGNOSIS — Z23 Encounter for immunization: Secondary | ICD-10-CM

## 2019-07-13 DIAGNOSIS — Z1159 Encounter for screening for other viral diseases: Secondary | ICD-10-CM

## 2019-07-13 DIAGNOSIS — E538 Deficiency of other specified B group vitamins: Secondary | ICD-10-CM | POA: Diagnosis not present

## 2019-07-13 DIAGNOSIS — Z130 Encounter for screening for diseases of the blood and blood-forming organs and certain disorders involving the immune mechanism: Secondary | ICD-10-CM | POA: Diagnosis not present

## 2019-07-13 DIAGNOSIS — Z79899 Other long term (current) drug therapy: Secondary | ICD-10-CM | POA: Diagnosis not present

## 2019-07-13 DIAGNOSIS — E559 Vitamin D deficiency, unspecified: Secondary | ICD-10-CM | POA: Diagnosis not present

## 2019-07-13 DIAGNOSIS — Z131 Encounter for screening for diabetes mellitus: Secondary | ICD-10-CM

## 2019-07-13 DIAGNOSIS — Z114 Encounter for screening for human immunodeficiency virus [HIV]: Secondary | ICD-10-CM

## 2019-07-13 LAB — COMPREHENSIVE METABOLIC PANEL
ALT: 6 U/L (ref 0–35)
AST: 14 U/L (ref 0–37)
Albumin: 4.1 g/dL (ref 3.5–5.2)
Alkaline Phosphatase: 68 U/L (ref 39–117)
BUN: 12 mg/dL (ref 6–23)
CO2: 28 mEq/L (ref 19–32)
Calcium: 9.2 mg/dL (ref 8.4–10.5)
Chloride: 104 mEq/L (ref 96–112)
Creatinine, Ser: 0.61 mg/dL (ref 0.40–1.20)
GFR: 101.15 mL/min (ref >60.00)
Glucose, Bld: 87 mg/dL (ref 70–99)
Potassium: 4.7 mEq/L (ref 3.5–5.1)
Sodium: 139 mEq/L (ref 135–145)
Total Bilirubin: 0.6 mg/dL (ref 0.2–1.2)
Total Protein: 6.2 g/dL (ref 6.0–8.3)

## 2019-07-13 LAB — LIPID PANEL
Cholesterol: 229 mg/dL — ABNORMAL HIGH (ref 0–200)
HDL: 51.5 mg/dL (ref >39.00)
LDL Cholesterol: 148 mg/dL — ABNORMAL HIGH (ref 0–99)
NonHDL: 177.04
Total CHOL/HDL Ratio: 4
Triglycerides: 145 mg/dL (ref 0.0–149.0)
VLDL: 29 mg/dL (ref 0.0–40.0)

## 2019-07-13 LAB — VITAMIN D 25 HYDROXY (VIT D DEFICIENCY, FRACTURES): VITD: 41.32 ng/mL (ref 30.00–100.00)

## 2019-07-13 LAB — CBC
HCT: 41.5 % (ref 36.0–46.0)
Hemoglobin: 13.9 g/dL (ref 12.0–15.0)
MCHC: 33.4 g/dL (ref 30.0–36.0)
MCV: 93.9 fl (ref 78.0–100.0)
Platelets: 269 10*3/uL (ref 150.0–400.0)
RBC: 4.43 Mil/uL (ref 3.87–5.11)
RDW: 13.6 % (ref 11.5–15.5)
WBC: 4 10*3/uL (ref 4.0–10.5)

## 2019-07-13 LAB — TSH: TSH: 2.42 u[IU]/mL (ref 0.35–4.50)

## 2019-07-13 LAB — VITAMIN B12: Vitamin B-12: 950 pg/mL — ABNORMAL HIGH (ref 211–911)

## 2019-07-13 LAB — HEMOGLOBIN A1C: Hgb A1c MFr Bld: 5.6 % (ref 4.6–6.5)

## 2019-07-13 MED ORDER — CYANOCOBALAMIN 1000 MCG/ML IJ SOLN
1000.0000 ug | Freq: Once | INTRAMUSCULAR | Status: AC
  Administered 2019-07-13: 10:00:00 1000 ug via INTRAMUSCULAR

## 2019-07-13 NOTE — Patient Instructions (Addendum)
Health Maintenance, Female Adopting a healthy lifestyle and getting preventive care are important in promoting health and wellness. Ask your health care provider about:  The right schedule for you to have regular tests and exams.  Things you can do on your own to prevent diseases and keep yourself healthy. What should I know about diet, weight, and exercise? Eat a healthy diet   Eat a diet that includes plenty of vegetables, fruits, low-fat dairy products, and lean protein.  Do not eat a lot of foods that are high in solid fats, added sugars, or sodium. Maintain a healthy weight Body mass index (BMI) is used to identify weight problems. It estimates body fat based on height and weight. Your health care provider can help determine your BMI and help you achieve or maintain a healthy weight. Get regular exercise Get regular exercise. This is one of the most important things you can do for your health. Most adults should:  Exercise for at least 150 minutes each week. The exercise should increase your heart rate and make you sweat (moderate-intensity exercise).  Do strengthening exercises at least twice a week. This is in addition to the moderate-intensity exercise.  Spend less time sitting. Even light physical activity can be beneficial. Watch cholesterol and blood lipids Have your blood tested for lipids and cholesterol at 57 years of age, then have this test every 5 years. Have your cholesterol levels checked more often if:  Your lipid or cholesterol levels are high.  You are older than 57 years of age.  You are at high risk for heart disease. What should I know about cancer screening? Depending on your health history and family history, you may need to have cancer screening at various ages. This may include screening for:  Breast cancer.  Cervical cancer.  Colorectal cancer.  Skin cancer.  Lung cancer. What should I know about heart disease, diabetes, and high blood  pressure? Blood pressure and heart disease  High blood pressure causes heart disease and increases the risk of stroke. This is more likely to develop in people who have high blood pressure readings, are of African descent, or are overweight.  Have your blood pressure checked: ? Every 3-5 years if you are 18-39 years of age. ? Every year if you are 40 years old or older. Diabetes Have regular diabetes screenings. This checks your fasting blood sugar level. Have the screening done:  Once every three years after age 40 if you are at a normal weight and have a low risk for diabetes.  More often and at a younger age if you are overweight or have a high risk for diabetes. What should I know about preventing infection? Hepatitis B If you have a higher risk for hepatitis B, you should be screened for this virus. Talk with your health care provider to find out if you are at risk for hepatitis B infection. Hepatitis C Testing is recommended for:  Everyone born from 1945 through 1965.  Anyone with known risk factors for hepatitis C. Sexually transmitted infections (STIs)  Get screened for STIs, including gonorrhea and chlamydia, if: ? You are sexually active and are younger than 57 years of age. ? You are older than 57 years of age and your health care provider tells you that you are at risk for this type of infection. ? Your sexual activity has changed since you were last screened, and you are at increased risk for chlamydia or gonorrhea. Ask your health care provider if   you are at risk.  Ask your health care provider about whether you are at high risk for HIV. Your health care provider may recommend a prescription medicine to help prevent HIV infection. If you choose to take medicine to prevent HIV, you should first get tested for HIV. You should then be tested every 3 months for as long as you are taking the medicine. Pregnancy  If you are about to stop having your period (premenopausal) and  you may become pregnant, seek counseling before you get pregnant.  Take 400 to 800 micrograms (mcg) of folic acid every day if you become pregnant.  Ask for birth control (contraception) if you want to prevent pregnancy. Osteoporosis and menopause Osteoporosis is a disease in which the bones lose minerals and strength with aging. This can result in bone fractures. If you are 65 years old or older, or if you are at risk for osteoporosis and fractures, ask your health care provider if you should:  Be screened for bone loss.  Take a calcium or vitamin D supplement to lower your risk of fractures.  Be given hormone replacement therapy (HRT) to treat symptoms of menopause. Follow these instructions at home: Lifestyle  Do not use any products that contain nicotine or tobacco, such as cigarettes, e-cigarettes, and chewing tobacco. If you need help quitting, ask your health care provider.  Do not use street drugs.  Do not share needles.  Ask your health care provider for help if you need support or information about quitting drugs. Alcohol use  Do not drink alcohol if: ? Your health care provider tells you not to drink. ? You are pregnant, may be pregnant, or are planning to become pregnant.  If you drink alcohol: ? Limit how much you use to 0-1 drink a day. ? Limit intake if you are breastfeeding.  Be aware of how much alcohol is in your drink. In the [place] one drink equals one 12 oz bottle of beer (355 mL), one 5 oz glass of wine (148 mL), or one 1 oz glass of hard liquor (44 mL). General instructions  Schedule regular health, dental, and eye exams.  Stay current with your vaccines.  Tell your health care provider if: ? You often feel depressed. ? You have ever been abused or do not feel safe at home. Summary  Adopting a healthy lifestyle and getting preventive care are important in promoting health and wellness.  Follow your health care provider's instructions about healthy  diet, exercising, and getting tested or screened for diseases.  Follow your health care provider's instructions on monitoring your cholesterol and blood pressure. This information is not intended to replace advice given to you by your health care provider. Make sure you discuss any questions you have with your health care provider. Document Released: 04/28/2011 Document Revised: 10/06/2018 Document Reviewed: 10/06/2018 Elsevier Patient Education  2020 Elsevier Inc.  

## 2019-07-13 NOTE — Progress Notes (Signed)
Patient ID: Patricia Alvarez, female  DOB: 1961/01/30, 58 y.o.   MRN: [id number] Patient Care Team    Relationship Specialty Notifications Start End  Ma Hillock, DO PCP - General Family Medicine  02/17/18   Ladene Artist, MD Consulting Physician Gastroenterology  02/17/18   Teodoro Spray, OD  Optometry  02/17/18   Molli Posey, MD Consulting Physician Obstetrics and Gynecology  02/17/18     Chief Complaint  Patient presents with  . Annual Exam    Fasting. Mammogram 06/16/2019. Pap smear 06/16/2019. Does not want flu shot    Subjective:  Patricia Alvarez is a 58 y.o.  Female  present for CPE . All past medical history, surgical history, allergies, family history, immunizations, medications and social history were updated in the electronic medical record today. All recent labs, ED visits and hospitalizations within the last year were reviewed. B12/Vit d def: - pt is continue her q 4 week injections of b12 in the office. This is a standing order for her as long as she follows every 12 months with cpe and recheck on b12.  Health maintenance:  Colonoscopy: completed 10/2018, by Dr. Fuller Plan. follow up 5 yr. Mammogram: completed:05/2019, birads Dr.Holland.  Cervical cancer screening: last pap: 2020,, completed by: Dr. Matthew Saras Immunizations: tdap UTD 2015, Influenza completed today (encouraged yearly), shingrix declined today Infectious disease screening: HIV and Hep C pt agreeable to testing today DEXA: routine screen Assistive device: none Oxygen [id number] Patient has a Dental home. Hospitalizations/ED visits: reviewed   Depression screen [facility] 2/9 07/13/2019 12/24/2018 07/28/2018 02/17/2018  Decreased Interest 0 0 0 0  Down, Depressed, Hopeless 0 0 0 0  PHQ - 2 Score 0 0 0 0   No flowsheet data found.   Immunization History  Administered Date(s) Administered  . Influenza,inj,Quad PF,6+ Mos 07/28/2018, 07/13/2019  . Td 06/14/2001  . Tdap 04/27/2014     Past Medical  History:  Diagnosis Date  . Allergic rhinitis   . Allergy   . B12 deficiency   . Back pain   . Chicken pox   . Colon polyp 2015   TUBULAR ADENOMA (X 1).  . DDD (degenerative disc disease), cervical   . Endometriosis of uterus   . GERD (gastroesophageal reflux disease)   . Hyperlipidemia   . IBS (irritable bowel syndrome)   . Internal hemorrhoid   . Migraines   . Rectal bleeding   . Vitamin D deficiency    Allergies  Allergen Reactions  . Codeine Itching and Nausea Only    REACTION: nausea  . Morphine Nausea Only    REACTION: nausea  . Prednisone     REACTION: Hyperactive   Past Surgical History:  Procedure Laterality Date  . APPENDECTOMY  02/2000   and ovary  . AUGMENTATION MAMMAPLASTY Bilateral 1998   Saline,retropectoral   . COLONOSCOPY W/ BIOPSIES  2015   multiple  . ESOPHAGOGASTRODUODENOSCOPY  2011   multiple  . LAPAROSCOPY    . OOPHORECTOMY Left 02/2000   w/appendectomy  . PARTIAL HYSTERECTOMY  1994   right ovary and uterus  . TOTAL HIP ARTHROPLASTY Right    August 31, 2018   Family History  Problem Relation Age of Onset  . Hyperlipidemia Mother 55  . Heart failure Mother 35  . Arthritis Mother   . Asthma Mother   . Hearing loss Mother   . Heart disease Mother   . Hypertension Mother   . Miscarriages / [place] Mother   .  Colon polyps Father   . Stroke Father   . Hearing loss Father   . Breast cancer Maternal Grandmother 43  . Colon cancer Neg Hx   . Esophageal cancer Neg Hx   . Rectal cancer Neg Hx   . Stomach cancer Neg Hx    Social History   Social History Narrative   Married. 2 children.    HS graduate. Employed as a Herbalist.    Uses bicycle helmet, smoke alarm in the home.   Feels safe in her relationships.   Caffeine- unsweet 1 quart most days.     Allergies as of 07/13/2019      Reactions   Codeine Itching, Nausea Only   REACTION: nausea   Morphine Nausea Only   REACTION: nausea   Prednisone    REACTION:  Hyperactive      Medication List       Accurate as of July 13, 2019 10:49 AM. If you have any questions, ask your nurse or doctor.        aspirin-acetaminophen-caffeine 250-250-65 MG tablet Commonly known as: EXCEDRIN MIGRAINE Take by mouth as needed for headache.   vitamin B-12 1000 MCG tablet Commonly known as: CYANOCOBALAMIN Take 1,000 mcg by mouth daily.   cyanocobalamin 1000 MCG/ML injection Commonly known as: (VITAMIN B-12) Inject 1 mL (1,000 mcg total) into the muscle every 30 (thirty) days for 12 doses. Taking every other week.   estradiol 0.5 MG tablet Commonly known as: ESTRACE Take 0.5 mg by mouth daily.   famotidine 20 MG tablet Commonly known as: Pepcid Take 1 tablet (20 mg total) by mouth 2 (two) times daily.   magnesium 30 MG tablet Take 30 mg by mouth daily.   omeprazole 40 MG capsule Commonly known as: PRILOSEC Take 1 capsule (40 mg total) by mouth daily.   rizatriptan 10 MG disintegrating tablet Commonly known as: MAXALT-MLT Take 1 tablet (10 mg total) by mouth as needed for migraine. May repeat in 2 hours if needed   Vitamin D (Ergocalciferol) 50 MCG (2000 UT) Caps Take 2,000 Units by mouth daily.       All past medical history, surgical history, allergies, family history, immunizations andmedications were updated in the EMR today and reviewed under the history and medication portions of their EMR.     No results found for this or any previous visit (from the past 2160 hour(s)).    ROS: 14 pt review of systems performed and negative (unless mentioned in an HPI)  Objective: BP 93/65 (BP Location: Left Arm, Patient Position: Sitting, Cuff Size: Normal)   Pulse 65   Temp (!) 97.5 F (36.4 C) (Temporal)   Resp 18   Ht 5\' 5"  (1.651 m)   Wt 146 lb 4 oz (66.3 kg)   SpO2 98%   BMI 24.34 kg/m  Gen: Afebrile. No acute distress. Nontoxic in appearance, well-developed, well-nourished,  Pleasant caucasian female.  HENT: AT. [place]. Bilateral  TM visualized and normal in appearance, normal external auditory canal. MMM, no oral lesions, adequate dentition. Bilateral nares within normal limits. Throat without erythema, ulcerations or exudates. no Cough on exam, no hoarseness on exam. Eyes:Pupils Equal Round Reactive to light, Extraocular movements intact,  Conjunctiva without redness, discharge or icterus. Neck/lymp/endocrine: Supple,no lymphadenopathy, no thyromegaly CV: RRR no murmur, no edema, +2/4 P posterior tibialis pulses. no carotid bruits. No JVD. Chest: CTAB, no wheeze, rhonchi or crackles. normal Respiratory effort. good Air movement. Abd: Soft. flat. NTND. BS present. no Masses palpated. No  hepatosplenomegaly. No rebound tenderness or guarding. Skin: no rashes, purpura or petechiae. Warm and well-perfused. Skin intact. Neuro/Msk:  Normal gait. PERLA. EOMi. Alert. Oriented x3.  Cranial nerves II through XII intact. Muscle strength 5/5 upper/lower extremity. DTRs equal bilaterally. Psych: Normal affect, dress and demeanor. Normal speech. Normal thought content and judgment.   No exam data present  Assessment/plan: Patricia Alvarez is a 58 y.o. female present for CPE. Encounter for long-term (current) use of medications - Comprehensive metabolic panel Mixed hyperlipidemia - Lipid panel - TSH Vitamin D deficiency - Vitamin D (25 hydroxy) Screening for deficiency anemia - CBC Diabetes mellitus screening - Hemoglobin A1c B12 deficiency b12 shot adminstered today after lab collection - B12 Need for hepatitis C screening test - Hepatitis C Antibody Encounter for screening for HIV - HIV antibody (with reflex) Encounter for preventive health examination Patient was encouraged to exercise greater than 150 minutes a week. Patient was encouraged to choose a diet filled with fresh fruits and vegetables, and lean meats. AVS provided to patient today for education/recommendation on gender specific health and safety maintenance.  Colonoscopy: completed 10/2018, by Dr. Fuller Plan. follow up 5 yr. Mammogram: completed:05/2019, birads Dr.Holland.  Cervical cancer screening: last pap: 2020,, completed by: Dr. Matthew Saras Immunizations: tdap UTD 2015, Influenza completed today (encouraged yearly), shingrix declined today- she will make a nurse appt when ready- Provider APPROVED her to call in and make nurse appt.  Infectious disease screening: HIV and Hep C pt agreeable to testing today DEXA: routine screen   Return in about 1 year (around 07/12/2020) for CPE (30 min).  Electronically signed by: Howard Pouch, DO [place]

## 2019-07-13 NOTE — Progress Notes (Signed)
Cardiology Office Note   Date:  07/14/2019   ID:  Patricia Alvarez, DOB [birth year], MRN [id number]  PCP:  Ma Hillock, DO  Cardiologist:   No primary care provider on file. Referring:  Self  Chief Complaint  Patient presents with  . Palpitations      History of Present Illness: Patricia Alvarez is a 58 y.o. female who presents for evaluation of an irregular heart rate.  She saw Dr. Verl Blalock years ago and  was managed for  very high with statins but had muscle aches.  She presents now with episodes of a skip or "jolt" that wakes her up at night.  It has happened twice.  She denies sustained tachyarrhythmias.  She is not had any presyncope or syncope.  She also wakes up occasionally with pickups.  She is not had any prior arrhythmia or other heart history.  The patient denies any new symptoms such as chest discomfort, neck or arm discomfort. There has been no new shortness of breath, PND or orthopnea. There have been no reported palpitations, presyncope or syncope.  She walks routinely 45 minutes/day.  He has no significant dyslipidemia as below.   Past Medical History:  Diagnosis Date  . Allergic rhinitis   . Allergy   . B12 deficiency   . Back pain   . Chicken pox   . Colon polyp 2015   TUBULAR ADENOMA (X 1).  . DDD (degenerative disc disease), cervical   . Endometriosis of uterus   . GERD (gastroesophageal reflux disease)   . Hyperlipidemia   . IBS (irritable bowel syndrome)   . Internal hemorrhoid   . Migraines   . Rectal bleeding   . Vitamin D deficiency     Past Surgical History:  Procedure Laterality Date  . APPENDECTOMY  02/2000   and ovary  . AUGMENTATION MAMMAPLASTY Bilateral 1998   Saline,retropectoral   . COLONOSCOPY W/ BIOPSIES  2015   multiple  . ESOPHAGOGASTRODUODENOSCOPY  2011   multiple  . LAPAROSCOPY    . OOPHORECTOMY Left 02/2000   w/appendectomy  . PARTIAL HYSTERECTOMY  1994   right ovary and uterus  . TOTAL HIP ARTHROPLASTY Right    August 31, 2018     Current Outpatient Medications  Medication Sig Dispense Refill  . aspirin-acetaminophen-caffeine (EXCEDRIN MIGRAINE) 250-250-65 MG tablet Take by mouth as needed for headache.    . cyanocobalamin (,VITAMIN B-12,) 1000 MCG/ML injection Inject 1 mL (1,000 mcg total) into the muscle every 30 (thirty) days for 12 doses. Taking every other week. 1 mL   . estradiol (ESTRACE) 0.5 MG tablet Take 0.5 mg by mouth daily.  3  . famotidine (PEPCID) 20 MG tablet Take 1 tablet (20 mg total) by mouth 2 (two) times daily. 60 tablet 1  . magnesium 30 MG tablet Take 30 mg by mouth daily.    Marland Kitchen omeprazole (PRILOSEC) 40 MG capsule Take 1 capsule (40 mg total) by mouth daily. 90 capsule 3  . rizatriptan (MAXALT-MLT) 10 MG disintegrating tablet Take 1 tablet (10 mg total) by mouth as needed for migraine. May repeat in 2 hours if needed 9 tablet 12  . vitamin B-12 (CYANOCOBALAMIN) 1000 MCG tablet Take 1,000 mcg by mouth daily.    . Vitamin D, Ergocalciferol, 2000 units CAPS Take 2,000 Units by mouth daily.     No current facility-administered medications for this visit.     Allergies:   Codeine, Morphine, and Prednisone    Social History:  The patient  reports that she has never smoked. She has never used smokeless tobacco. She reports that she does not drink alcohol or use drugs.   Family History:  The patient's family history includes Arthritis in her mother; Asthma in her mother; Breast cancer (age of onset: 87) in her maternal grandmother; Colon polyps in her father; Hearing loss in her father and mother; Heart disease in her mother; Heart failure (age of onset: 48) in her mother; Hyperlipidemia (age of onset: 16) in her mother; Hypertension in her mother; Miscarriages / Stillbirths in her mother; Stroke in her father.    ROS:  Please see the history of present illness.   Otherwise, review of systems are positive for none.   All other systems are reviewed and negative.    PHYSICAL EXAM: VS:  BP  98/68 (BP Location: Left Arm)   Pulse 72   Temp (!) 97 F (36.1 C)   Ht 5\' 5"  (1.651 m)   Wt 147 lb 3.2 oz (66.8 kg)   SpO2 97%   BMI 24.50 kg/m  , BMI Body mass index is 24.5 kg/m. GENERAL:  Well appearing HEENT:  Pupils equal round and reactive, fundi not visualized, oral mucosa unremarkable NECK:  No jugular venous distention, waveform within normal limits, carotid upstroke brisk and symmetric, no bruits, no thyromegaly LYMPHATICS:  No cervical, inguinal adenopathy LUNGS:  Clear to auscultation bilaterally BACK:  No CVA tenderness CHEST:  Unremarkable HEART:  PMI not displaced or sustained,S1 and S2 within normal limits, no S3, no S4, no clicks, no rubs, no murmurs ABD:  Flat, positive bowel sounds normal in frequency in pitch, no bruits, no rebound, no guarding, no midline pulsatile mass, no hepatomegaly, no splenomegaly EXT:  2 plus pulses throughout, no edema, no cyanosis no clubbing SKIN:  No rashes no nodules NEURO:  Cranial nerves II through XII grossly intact, motor grossly intact throughout PSYCH:  Cognitively intact, oriented to person place and time    EKG:  EKG is ordered today. The ekg ordered today demonstrates NSR axis within normal limits, intervals within normal limits, RSR 1 V1 and V2, no acute ST-T wave changes.   Recent Labs: 07/13/2019: ALT 6; BUN 12; Creatinine, Ser 0.61; Hemoglobin 13.9; Platelets 269.0; Potassium 4.7; Sodium 139; TSH 2.42    Lipid Panel    Component Value Date/Time   CHOL 229 (H) 07/13/2019 0942   TRIG 145.0 07/13/2019 0942   HDL 51.50 07/13/2019 0942   CHOLHDL 4 07/13/2019 0942   VLDL 29.0 07/13/2019 0942   LDLCALC 148 (H) 07/13/2019 0942   LDLDIRECT 168.7 01/06/2008 1011      Wt Readings from Last 3 Encounters:  07/14/19 147 lb 3.2 oz (66.8 kg)  07/13/19 146 lb 4 oz (66.3 kg)  06/23/19 148 lb (67.1 kg)      Other studies Reviewed: Additional studies/ records that were reviewed today include: Labs. Review of the above  records demonstrates:  Please see elsewhere in the note.     ASSESSMENT AND PLAN:  PALPITATIONS:    These are very infrequent.  We talked about potentially having an event monitor but at this point she wants to just wait.  By her exam and her EKG I would suspect a structurally normal heart.  She will let me know if she has any worsening symptoms and we would pursue a monitor of some sort.  Of note her electrolytes and TSH were normal.  DYSLIPIDEMIA: She has significant dyslipidemia.  She is a family  history of her mother having early heart disease.  At this point I like to screen her with a coronary calcium score.  This will help [place] determine the goals of therapy with her lipids.   Current medicines are reviewed at length with the patient today.  The patient does not have concerns regarding medicines.  The following changes have been made:  no change  Labs/ tests ordered today include:   Orders Placed This Encounter  Procedures  . CT CARDIAC SCORING  . EKG 12-Lead     Disposition:   FU with me as needed.      Signed, Minus Breeding, MD  07/14/2019 10:09 AM    [facility]

## 2019-07-14 ENCOUNTER — Ambulatory Visit (INDEPENDENT_AMBULATORY_CARE_PROVIDER_SITE_OTHER): Payer: BC Managed Care – PPO | Admitting: Cardiology

## 2019-07-14 ENCOUNTER — Telehealth: Payer: Self-pay | Admitting: Family Medicine

## 2019-07-14 ENCOUNTER — Encounter: Payer: Self-pay | Admitting: Cardiology

## 2019-07-14 ENCOUNTER — Encounter: Payer: Self-pay | Admitting: Family Medicine

## 2019-07-14 VITALS — BP 98/68 | HR 72 | Temp 97.0°F | Ht 65.0 in | Wt 147.2 lb

## 2019-07-14 DIAGNOSIS — R002 Palpitations: Secondary | ICD-10-CM | POA: Diagnosis not present

## 2019-07-14 LAB — HIV ANTIBODY (ROUTINE TESTING W REFLEX): HIV 1&2 Ab, 4th Generation: NONREACTIVE

## 2019-07-14 LAB — HEPATITIS C ANTIBODY
Hepatitis C Ab: NONREACTIVE
SIGNAL TO CUT-OFF: 0.01 (ref <1.00)

## 2019-07-14 NOTE — Progress Notes (Signed)
Patient referred by Dr. Raoul Pitch, seen by me on 06/23/19, HST on 07/06/19.   Please call and notify the patient that the recent home sleep test did not show any significant obstructive sleep apnea, some snoring was noted, more so in the supine position. CPAP therapy is not warranted based on this test.  She can follow up with her referring provider.  Thanks,  Star Age, MD, PhD [facility])

## 2019-07-14 NOTE — Procedures (Signed)
Patient Information     First Name: Patricia Last Name: Alvarez ID: [id number]  Birth Date: 1961-08-17 Age: 58 Gender: Female  Referring Provider: Howard Pouch, DO BMI: 24.6 (W=148 lb, H=5' 5'')  Neck Circ.:  14 '' Epworth:  11   Sleep Study Information    Study Date: Jul 06, 2019 S/H/A Version: 001.001.001.001 / 4.1.1528 / 44  Referring Physician Information      58 year old woman with a history of migraines, degenerative disc disease, reflux disease, hyperlipidemia, vitamin D deficiency, B12 deficiency and history of hip arthritis, who reports recent onset of nighttime chest pain.  She has woken up with severe reflux as well. She has occasional morning headaches and occasional nocturia.   Diagnosis: Snoring Recommendation:  This home sleep test does not demonstrate any significant obstructive or central sleep disordered breathing. Her AHI was less than 5/hour. Some snoring was noted, mostly mild, more noticeable in the supine position.  Other causes of the patient's symptoms, including circadian rhythm disturbances, an underlying mood disorder, medication effect and/or an underlying medical problem cannot be ruled out based on this test. Clinical correlation is recommended. The patient should be cautioned not to drive, work at heights, or operate dangerous or heavy equipment when tired or sleepy. Review and reiteration of good sleep hygiene measures should be pursued with any patient. The patient can follow up with her referring provider, who will be notified of the test results.  I certify that I have reviewed the raw data recording prior to the issuance of this report in accordance with the standards of the American Academy of Sleep Medicine (AASM).  Star Age, MD, PhD Diplomat, ABPN (Neurology and Sleep)                          Sleep Summary  Oxygen Saturation S0tatistics   Start Study Time: End Study Time: Total Recording Time:  10:14:49 PM 6:03:24 AM 7 hrs, 48  min  Total Sleep Time % REM of Sleep Time:  6 hrs, 58 min 36.5    Mean: 96 Minimum: 88 Maximum: 100  Mean of Desaturations Nadirs (%):   94  Oxygen Desatur. %: 4-9 10-20 >20 Total  Events Number Total  11 100.0  0 0.0  0 0.0  11 100.0  Oxygen Saturation: <90 <=88 <85 <80 <70  Duration (minutes): Sleep % 0.0 0.0 0.0 0.0 0.0 0.0 0.0 0.0 0.0 0.0     Respiratory Indices      Total Events REM NREM All Night  pRDI:  67 pAHI:  29 ODI:  11 pAHIc:  0 % CSR: 0.0 15.1 8.3 2.8 0.0 6.6 1.8 0.9 0.0 9.7 4.2 1.6 0.0       Pulse Rate Statistics during Sleep (BPM)      Mean: 71 Minimum: 58 Maximum: 116    Indices are calculated using technically valid sleep time of  6 hrs, 55 min. Central-Indices are calculated using technically valid sleep time of  6  hrs, 46 min. pRDI/pAHI are calculated using oxi desaturations ? 3% Sit N/A Body Position Statistics  Position Supine Prone Right Left Non-Supine  Sleep (min) 181.0 0.0 81.0 156.1 237.1  Sleep % 43.3 0.0 19.4 37.3 56.7  pRDI 8.7 N/A 9.0 11.2 10.4  pAHI 5.0 N/A 3.7 3.5 3.6  ODI 2.3 N/A 1.5 0.8 1.0     Snoring Statistics Snoring Level (dB) >40 >50 >60 >70 >80 >Threshold (45)  Sleep (min) 299.0  5.2 2.5 1.1 0.0 7.6  Sleep % 71.5 1.2 0.6 0.3 0.0 1.8    Mean: 41 dB Sleep Stages Chart   * Reference values are according to AASM guidelines

## 2019-07-14 NOTE — Telephone Encounter (Signed)
Pt was called and given information. She would like to continue to come to the office to get B12 injections. She verbalized understanding.

## 2019-07-14 NOTE — Patient Instructions (Addendum)
Medication Instructions:  Your physician recommends that you continue on your current medications as directed. Please refer to the Current Medication list given to you today.  If you need a refill on your cardiac medications before your next appointment, please call your pharmacy.   Lab work: NONE  Testing/Procedures: Your physician has ordered you to have non-invasive CT for Calcium Scoring of your heart. It will calculate your risk of developing Coronary Artery Disease (CAD) by measuring the amount of buildup of calcium in the plaque in the coronary arteries (arteries surrounding your heart). This test is done at [address]. Please call [phone] to schedule the test.   Follow-Up: At [facility], you and your health needs are our priority.  As part of our continuing mission to provide you with exceptional heart care, we have created designated Provider Care Teams.  These Care Teams include your primary Cardiologist (physician) and Advanced Practice Providers (APPs -  Physician Assistants and Nurse Practitioners) who all work together to provide you with the care you need, when you need it. You can make an appointment as needed. You may see Minus Breeding, MD or one of the following Advanced Practice Providers on your designated Care Team:   Rosaria Ferries, PA-C Jory Sims, DNP, ANP

## 2019-07-14 NOTE — Telephone Encounter (Signed)
Please inform patient the following information: Her liver, kidney, electrolytes, vitamin D, B12, blood counts, diabetes screen, thyroid, hepatitis C and HIV labs are all normal and look great. -Continue B12 injections every 4 weeks for 12 months.  She would like to continue to come in for nurse visit for these we can arrange or if she is comfortable with giving injections at home we can order them for her. -Her cholesterol panel is okay her good cholesterol/HDL looks great at 51.  Her triglycerides are great at 145.  Her LDL which is the bad cholesterol, is a little elevated at 148.  This is not high enough to require medication at this time since she has no other conditions such as hypertension, diabetes etc.  However I would encourage her to strive for an LDL goal below 130 and closer to 100 given her family history.  -  I would recommend a mediterranean diet and regular exercise.  A mediterranean diet is high in fruits, vegetables, whole grains, fish, chicken, nuts, healthy fats (olive oil or canola oil). Low fat dairy. There are many online resources and books on this diet. Limit butter, margarine, red meat and sweets.

## 2019-07-15 ENCOUNTER — Other Ambulatory Visit: Payer: Self-pay

## 2019-07-15 MED ORDER — FAMOTIDINE 20 MG PO TABS: 20.0000 mg | ORAL_TABLET | Freq: Two times a day (BID) | ORAL | 1 refills | Status: DC

## 2019-07-15 NOTE — Progress Notes (Signed)
Pharmacy never received previous RX [facility] x[facility].

## 2019-07-18 ENCOUNTER — Telehealth: Payer: Self-pay

## 2019-07-18 NOTE — Telephone Encounter (Signed)
I called pt and discussed her sleep study results and recommendations. Pt verbalized understanding of results. Pt had no questions at this time but was encouraged to call back if questions arise.  

## 2019-07-18 NOTE — Telephone Encounter (Signed)
-----   Message from Star Age, MD sent at 07/14/2019  6:37 PM EDT ----- Patient referred by Dr. Raoul Pitch, seen by me on 06/23/19, HST on 07/06/19.   Please call and notify the patient that the recent home sleep test did not show any significant obstructive sleep apnea, some snoring was noted, more so in the supine position. CPAP therapy is not warranted based on this test.  She can follow up with her referring provider.  Thanks,  Star Age, MD, PhD [facility])

## 2019-07-21 DIAGNOSIS — Z471 Aftercare following joint replacement surgery: Secondary | ICD-10-CM | POA: Diagnosis not present

## 2019-07-21 DIAGNOSIS — Z96641 Presence of right artificial hip joint: Secondary | ICD-10-CM | POA: Diagnosis not present

## 2019-07-26 ENCOUNTER — Encounter

## 2019-07-26 ENCOUNTER — Ambulatory Visit: Payer: BC Managed Care – PPO | Admitting: Cardiology

## 2019-08-02 ENCOUNTER — Ambulatory Visit: Payer: BC Managed Care – PPO | Admitting: Gastroenterology

## 2019-08-11 ENCOUNTER — Ambulatory Visit (INDEPENDENT_AMBULATORY_CARE_PROVIDER_SITE_OTHER): Payer: BC Managed Care – PPO

## 2019-08-11 ENCOUNTER — Other Ambulatory Visit: Payer: Self-pay

## 2019-08-11 DIAGNOSIS — E538 Deficiency of other specified B group vitamins: Secondary | ICD-10-CM

## 2019-08-11 MED ORDER — CYANOCOBALAMIN 1000 MCG/ML IJ SOLN
1000.0000 ug | Freq: Once | INTRAMUSCULAR | Status: AC
  Administered 2019-08-11: 1000 ug via INTRAMUSCULAR

## 2019-08-11 NOTE — Progress Notes (Addendum)
Patricia Alvarez a 58 y.o.femalepresents to the office today for Vitamin B12injections, per physician's orders.  Original order:12/27/2018 -B12 injections by nurse visit every 4 Weeks x 12 doses Vitamin B12, 1097mcg, IMwas administered left deltoidtoday. Patient tolerated injection. Patient due for follow up labs/provider appt:No. Date due:07/12/2020, appt madeNo Patient next injection due: 09/11/19, appt made:yes 09/12/2019  Athony Coppa, CMA  ADDENDUM: Pt with vit B12 deficiency.  Agree with vit B12 1000 mcg IM in office today. Signed:  Crissie Sickles, MD           08/18/2019

## 2019-08-18 ENCOUNTER — Ambulatory Visit (INDEPENDENT_AMBULATORY_CARE_PROVIDER_SITE_OTHER)
Admission: RE | Admit: 2019-08-18 | Discharge: 2019-08-18 | Disposition: A | Discharge status: Home / Self Care | Payer: Self-pay | Source: Ambulatory Visit | Attending: Cardiology | Admitting: Cardiology

## 2019-08-18 ENCOUNTER — Other Ambulatory Visit: Payer: Self-pay

## 2019-08-18 DIAGNOSIS — R002 Palpitations: Secondary | ICD-10-CM

## 2019-08-22 ENCOUNTER — Telehealth: Payer: Self-pay | Admitting: Cardiology

## 2019-08-22 DIAGNOSIS — M25531 Pain in right wrist: Secondary | ICD-10-CM | POA: Diagnosis not present

## 2019-08-22 DIAGNOSIS — M67431 Ganglion, right wrist: Secondary | ICD-10-CM | POA: Insufficient documentation

## 2019-08-22 NOTE — Telephone Encounter (Signed)
New message   Patient would like a call to go over CT cardiac results.

## 2019-08-22 NOTE — Telephone Encounter (Signed)
Pt needed clarification on Cardiac Scoring results. Verbalized understanding.

## 2019-09-01 ENCOUNTER — Other Ambulatory Visit: Payer: Self-pay

## 2019-09-01 DIAGNOSIS — Z20822 Contact with and (suspected) exposure to covid-19: Secondary | ICD-10-CM

## 2019-09-01 DIAGNOSIS — R6883 Chills (without fever): Secondary | ICD-10-CM | POA: Diagnosis not present

## 2019-09-01 DIAGNOSIS — R52 Pain, unspecified: Secondary | ICD-10-CM | POA: Diagnosis not present

## 2019-09-01 DIAGNOSIS — R519 Headache, unspecified: Secondary | ICD-10-CM | POA: Diagnosis not present

## 2019-09-01 DIAGNOSIS — Z20828 Contact with and (suspected) exposure to other viral communicable diseases: Secondary | ICD-10-CM | POA: Diagnosis not present

## 2019-09-02 LAB — NOVEL CORONAVIRUS, NAA: SARS-CoV-2, NAA: NOT DETECTED

## 2019-09-06 ENCOUNTER — Encounter: Payer: Self-pay | Admitting: Gastroenterology

## 2019-09-06 ENCOUNTER — Ambulatory Visit: Payer: BC Managed Care – PPO | Admitting: Gastroenterology

## 2019-09-06 ENCOUNTER — Other Ambulatory Visit: Payer: Self-pay

## 2019-09-06 VITALS — BP 108/64 | HR 72 | Temp 98.0°F | Ht 65.0 in | Wt 147.0 lb

## 2019-09-06 DIAGNOSIS — K594 Anal spasm: Secondary | ICD-10-CM | POA: Diagnosis not present

## 2019-09-06 DIAGNOSIS — R066 Hiccough: Secondary | ICD-10-CM

## 2019-09-06 DIAGNOSIS — K219 Gastro-esophageal reflux disease without esophagitis: Secondary | ICD-10-CM

## 2019-09-06 MED ORDER — OMEPRAZOLE 40 MG PO CPDR: 40.0000 mg | DELAYED_RELEASE_CAPSULE | Freq: Every day | ORAL | 3 refills | Status: DC

## 2019-09-06 NOTE — Progress Notes (Signed)
    History of Present Illness: This is a 58 year old female with intermittent hiccups and intermittent rectal pain. Hiccups occur at night about every 2 months. She takes omeprazole daily. Her PCP recommended famotidine hs however she has not taken this regularly. Rectal pain wakes her at night infrequently. No associated with BM or other GI symptoms. Resolves after minutes to 1 hour.   Colonoscopy 10/2018 - Internal hemorrhoids. - The examination was otherwise normal on direct and retroflexion views. - No specimens collected.  Current Medications, Allergies, Past Medical History, Past Surgical History, Family History and Social History were reviewed in [employer] record.   Physical Exam: General: Well developed, well nourished, no acute distress Head: Normocephalic and atraumatic Eyes:  sclerae anicteric, EOMI Ears: Normal auditory acuity Mouth: No deformity or lesions Lungs: Clear throughout to auscultation Heart: Regular rate and rhythm; no murmurs, rubs or bruits Abdomen: Soft, non tender and non distended. No masses, hepatosplenomegaly or hernias noted. Normal Bowel sounds Rectal: Not done Musculoskeletal: Symmetrical with no gross deformities  Pulses:  Normal pulses noted Extremities: No clubbing, cyanosis, edema or deformities noted Neurological: Alert oriented x 4, grossly nonfocal Psychological:  Alert and cooperative. Normal mood and affect   Assessment and Recommendations:  1. GERD. Nocturnal hiccups likely represent nocturnal GERD. Continue omeprazole 40 mg po qam and famotidine 20 mg po qhs. If symptoms not controlled increase to famotidine 40 mg po qhs. Lastly omeprazole 40 mg po bid if above not effective. REV in 1 year.   2. Proctalgia fugax. Reassurance provided. If symptoms persist and last longer than 15 minutes per episode start hyoscyamine 0.125 - 0.25 mg SL/PO q4h prn.

## 2019-09-06 NOTE — Patient Instructions (Addendum)
We have sent the following medications to your pharmacy for you to pick up at your convenience: omeprazole.   Take famotidine daily at bedtime.   Thank you for choosing me and [facility].  Pricilla Riffle. Dagoberto Ligas., MD., Marval Regal

## 2019-09-12 ENCOUNTER — Ambulatory Visit: Payer: BC Managed Care – PPO

## 2019-09-16 ENCOUNTER — Other Ambulatory Visit: Payer: Self-pay

## 2019-09-16 ENCOUNTER — Ambulatory Visit (INDEPENDENT_AMBULATORY_CARE_PROVIDER_SITE_OTHER): Payer: BC Managed Care – PPO

## 2019-09-16 DIAGNOSIS — E538 Deficiency of other specified B group vitamins: Secondary | ICD-10-CM | POA: Diagnosis not present

## 2019-09-16 MED ORDER — CYANOCOBALAMIN 1000 MCG/ML IJ SOLN
1000.0000 ug | Freq: Once | INTRAMUSCULAR | Status: AC
  Administered 2019-09-16: 1000 ug via INTRAMUSCULAR

## 2019-09-16 NOTE — Progress Notes (Addendum)
Patricia Alvarez is a 58 year old femalepresents to the office today for Vitamin B12injections, per physician's orders.  Original order:12/27/2018 -B12 injections by nurse visit every 4 Weeks x 12 doses Vitamin B12, 1011mcg, IMwas administered left deltoidtoday. Patient tolerated injection. Patient due for follow up labs/provider appt:No. Date due:07/12/2020, appt madeNo Patient next injection due:10/16/2019, appt made:yes 10/17/2019  [name] LPN   Medical screening examination/treatment/procedure(s) were performed by non-physician practitioner and as supervising physician I was immediately available for consultation/collaboration.  I agree with above assessment and plan.  Electronically Signed by: Howard Pouch, DO [place] [facility] [place]

## 2019-10-17 ENCOUNTER — Ambulatory Visit: Payer: BC Managed Care – PPO

## 2019-10-18 ENCOUNTER — Ambulatory Visit: Payer: BC Managed Care – PPO

## 2019-10-19 ENCOUNTER — Ambulatory Visit (INDEPENDENT_AMBULATORY_CARE_PROVIDER_SITE_OTHER): Payer: BC Managed Care – PPO

## 2019-10-19 ENCOUNTER — Other Ambulatory Visit: Payer: Self-pay

## 2019-10-19 DIAGNOSIS — E538 Deficiency of other specified B group vitamins: Secondary | ICD-10-CM

## 2019-10-19 MED ORDER — CYANOCOBALAMIN 1000 MCG/ML IJ SOLN
1000.0000 ug | Freq: Once | INTRAMUSCULAR | Status: AC
  Administered 2019-10-19: 1000 ug via INTRAMUSCULAR

## 2019-10-19 NOTE — Progress Notes (Addendum)
Patricia Alvarez is a 58 year old femalepresents to the office today for Vitamin B12injections, per physician's orders.  Original order:12/27/2018 -B12 injections by nurse visit every 4 Weeks x 12 doses Vitamin B12, 1080mcg, IMwas administered left deltoidtoday. Patient tolerated injection. Patient due for follow up labs/provider appt:No. Date due:07/12/2020, appt [facility] Patient next injection due:11/18/2018, appt made:yes 11/16/2018  [name] LPN   Sent to Dr Anitra Lauth to review in Dr Raoul Pitch absence    Pt with vit B12 deficiency.  Agree with vit B12 1000 mcg IM in office today. Signed:  Crissie Sickles, MD           11/01/2019

## 2019-11-17 ENCOUNTER — Other Ambulatory Visit: Payer: Self-pay

## 2019-11-17 ENCOUNTER — Ambulatory Visit (INDEPENDENT_AMBULATORY_CARE_PROVIDER_SITE_OTHER): Payer: BC Managed Care – PPO

## 2019-11-17 DIAGNOSIS — E538 Deficiency of other specified B group vitamins: Secondary | ICD-10-CM | POA: Diagnosis not present

## 2019-11-17 MED ORDER — CYANOCOBALAMIN 1000 MCG/ML IJ SOLN
1000.0000 ug | Freq: Once | INTRAMUSCULAR | Status: AC
  Administered 2019-11-17: 1000 ug via INTRAMUSCULAR

## 2019-11-17 NOTE — Progress Notes (Signed)
Rivkah Baye Brownis a 75 year oldfemalepresents to the office today for Vitamin B12injections, per physician's orders.  Original order:12/27/2018 -B12 injections by nurse visit every 4 Weeks x 12 doses Vitamin B12, 1083mcg, IMwas administered left deltoidtoday. Patient tolerated injection. Patient due for follow up labs/provider appt:No. Date due:07/12/2020, appt madeNo Patient next injection due:2/21//2020, appt made:No   [name]  Sent to Dr Anitra Lauth to review in Dr Raoul Pitch absence    Pt with vit B12 deficiency.  Agree with vit B12 1000 mcg IM in office today.

## 2019-12-05 ENCOUNTER — Telehealth: Payer: Self-pay | Admitting: Gastroenterology

## 2019-12-05 NOTE — Telephone Encounter (Signed)
Patient reports a week and a half of lower abdominal cramping and bloating.  She will see Carl Best, RNP at the [facility] office tomorrow at 3:00

## 2019-12-06 ENCOUNTER — Ambulatory Visit: Payer: BC Managed Care – PPO | Admitting: Nurse Practitioner

## 2019-12-06 ENCOUNTER — Other Ambulatory Visit (INDEPENDENT_AMBULATORY_CARE_PROVIDER_SITE_OTHER): Payer: BC Managed Care – PPO

## 2019-12-06 ENCOUNTER — Encounter: Payer: Self-pay | Admitting: Nurse Practitioner

## 2019-12-06 ENCOUNTER — Other Ambulatory Visit: Payer: Self-pay

## 2019-12-06 VITALS — BP 110/78 | HR 77 | Temp 97.1°F | Ht 65.0 in | Wt 150.0 lb

## 2019-12-06 DIAGNOSIS — R140 Abdominal distension (gaseous): Secondary | ICD-10-CM

## 2019-12-06 DIAGNOSIS — R1033 Periumbilical pain: Secondary | ICD-10-CM | POA: Diagnosis not present

## 2019-12-06 DIAGNOSIS — R1030 Lower abdominal pain, unspecified: Secondary | ICD-10-CM | POA: Diagnosis not present

## 2019-12-06 LAB — COMPREHENSIVE METABOLIC PANEL
ALT: 8 U/L (ref 0–35)
AST: 17 U/L (ref 0–37)
Albumin: 4.2 g/dL (ref 3.5–5.2)
Alkaline Phosphatase: 68 U/L (ref 39–117)
BUN: 9 mg/dL (ref 6–23)
CO2: 30 mEq/L (ref 19–32)
Calcium: 9.1 mg/dL (ref 8.4–10.5)
Chloride: 103 mEq/L (ref 96–112)
Creatinine, Ser: 0.61 mg/dL (ref 0.40–1.20)
GFR: 101.01 mL/min (ref >60.00)
Glucose, Bld: 92 mg/dL (ref 70–99)
Potassium: 4.5 mEq/L (ref 3.5–5.1)
Sodium: 138 mEq/L (ref 135–145)
Total Bilirubin: 0.4 mg/dL (ref 0.2–1.2)
Total Protein: 6.9 g/dL (ref 6.0–8.3)

## 2019-12-06 LAB — C-REACTIVE PROTEIN: CRP: <1 mg/dL (ref 0.5–20.0)

## 2019-12-06 MED ORDER — DICYCLOMINE HCL 10 MG PO CAPS: 10.0000 mg | ORAL_CAPSULE | Freq: Three times a day (TID) | ORAL | 0 refills | Status: DC | PRN

## 2019-12-06 NOTE — Progress Notes (Signed)
12/06/2019 Patricia Alvarez [id number] 05-Apr-1961   Chief Complaint: Lower abdominal pain   History of Present Illness: Patricia Alvarez. Patricia Alvarez is a 59 year old female with a past medical history of hyperlipidemia, degenerative disc disease, migraine headaches, GERD, IBS and colon polyps. She presents today with complaints of lower abdominal pain and bloat which is progressively worsened over the past 2 weeks.  She reports having a normal formed Patricia Alvarez bowel movement every day and sometimes every other day which is her normal pattern.  However for the past week or so she is awakening at 4:56 in the morning with lower abdominal cramping described as a menstrual type cramping then passes a normal formed bowel movement which is out of her routine.  She denies feeling constipated. No rectal bleeding or melena.  She denies taking any new medications.  No recent antibiotics.  She took a probiotic in the past which gave all odorous flatulence so she does not wish to try any other probiotics. She reports gaining 9 pounds over the past 2 weeks.  No fever, sweats or chills.  No dietary changes.  She underwent a partial hysterectomy in 1994 with a removal of her left ovary.  She underwent a second surgery in 2001 and had her right ovary and appendix removed.  Past tummy tuck surgery.  She reported having a small bowel obstruction approximately 17 years ago which resolved with bowel rest, no surgical intervention was required.  She underwent a colonoscopy by Dr. Fuller Alvarez 11/05/2018 which showed internal hemorrhoids, otherwise was normal.  She was last seen in the office by Dr. Fuller Alvarez 08/06/2019 with hiccups and rectal pain.  She was assessed to have nocturnal hiccups possibly related to reflux and proctalgia Patricia Alvarez.  She is taking omeprazole 40 mg in the morning and famotidine 20 mg at bedtime.  She awakens maybe once a month with hiccups which resolves within 5 minutes after she drinks a sip of orange juice.  No recent rectal  pain.  Colonoscopy 11/05/2018: - Internal hemorrhoids. - The examination was otherwise normal on direct and retroflexion views. - No specimens collected. Repeat colonoscopy in 5 years  Current Medications, Allergies, Past Medical History, Past Surgical History, Family History and Social History were reviewed in [employer] record.  Physical Exam: BP 110/78   Pulse 77   Temp (!) 97.1 F (36.2 C)   Ht 5\' 5"  (1.651 m)   Wt 150 lb (68 kg)   BMI 24.96 kg/m  General: Well developed 59 year old female in no acute distress. Head: Normocephalic and atraumatic. Eyes:  No scleral icterus. Conjunctiva pink . Ears: Normal auditory acuity. Lungs: Clear throughout to auscultation. Heart: Regular rate and rhythm, no murmur. Abdomen: Soft, moderate tenderness periumbilical area, RLQ and central lower abdomen without rebound or guarding. No masses or hepatomegaly. Normal bowel sounds x 4 quadrants. Umbilical and lower abdominal horizontal scar intact.  Rectal: Deferred.  Musculoskeletal: Symmetrical with no gross deformities. Extremities: No edema. Neurological: Alert oriented x 4. No focal deficits.  Psychological:  Alert and cooperative. Normal mood and affect  Assessment and Recommendations: 43.  59 year old female lower abdominal cramping pain and bloat which has progressively worsened over the past 2 weeks.  Possible adhesions from multiple abdominal surgeries.  History of a possible partial small bowel obstruction 17 years ago. -CBC, CMP and CRP -Abdominal/pelvic CT with oral and IV contrast to rule out any inflammatory/infectious abdominal process, unlikely developing SBO. -Dicyclomine 10 mg 1 p.o. every 8  hours as needed. -IBgard 1 p.o. twice daily as needed -Call our office if her symptoms worsen.  Follow-up in the office in 2 to 3 weeks.  2.  GERD, table  3.  Colon cancer screening, up to date

## 2019-12-06 NOTE — Patient Instructions (Addendum)
If you are age 58 or older, your body mass index should be between 23-30. Your There is no height or weight on file to calculate BMI. If this is out of the aforementioned range listed, please consider follow up with your Primary Care Provider.  If you are age 6 or younger, your body mass index should be between 19-25. Your There is no height or weight on file to calculate BMI. If this is out of the aformentioned range listed, please consider follow up with your Primary Care Provider.    You are scheduled on 12/07/2019 at 4:00 pm at [facility], You should arrive 15 minutes prior to your appointment time for registration. Please follow the written instructions below on the day of your exam:  WARNING: IF YOU ARE ALLERGIC TO IODINE/X-RAY DYE, PLEASE NOTIFY RADIOLOGY IMMEDIATELY AT [phone]! YOU WILL BE GIVEN A 13 HOUR PREMEDICATION  1) Do not eat or drink anything after 12:00pm (4 hours prior to your test) 2) You have been given 2 bottles of oral contrast to drink. The solution may taste better if refrigerated, but do NOT add ice or any other liquid to this solution. Shake well before drinking.    Drink 1 bottle of contrast '@2'$ :00 am  (2 hours prior to your exam)  Drink 1 bottle of contrast @ 3:00 am (1 hour prior to your exam)  You may take any medications as prescribed with a small amount of water, if necessary. If you take any of the following medications: METFORMIN, GLUCOPHAGE, GLUCOVANCE, AVANDAMET, RIOMET, FORTAMET, [place] MET, JANUMET, GLUMETZA or METAGLIP, you MAY be asked to HOLD this medication 48 hours AFTER the exam.  The purpose of you drinking the oral contrast is to aid in the visualization of your intestinal tract. The contrast solution may cause some diarrhea. Depending on your individual set of symptoms, you may also receive an intravenous injection of x-ray contrast/dye. Plan on being at [facility] for 30 minutes or longer, depending on the type of exam you are having  performed.  This test typically takes 30-45 minutes to complete.  If you have any questions regarding your exam or if you need to reschedule, you may call the CT department at [phone] between the hours of 8:00 am and 5:00 pm, Monday-Friday.  ________________________________________________________________________ We have sent the following medications to your pharmacy for you to pick up at your convenience:  Dicyclomine 10 mg  Please use IB guard 1 capsule by mouth 2 times a day for andominal pain and bloating.   Follow up in 2- 3 weeks. Due to recent changes in healthcare laws, you may see the results of your imaging and laboratory studies on MyChart before your provider has had a chance to review them.  We understand that in some cases there may be results that are confusing or concerning to you. Not all laboratory results come back in the same time frame and the provider may be waiting for multiple results in order to interpret others.  Please give [place] 48 hours in order for your provider to thoroughly review all the results before contacting the office for clarification of your results.

## 2019-12-07 ENCOUNTER — Telehealth: Payer: Self-pay | Admitting: Nurse Practitioner

## 2019-12-07 LAB — CBC WITH DIFFERENTIAL/PLATELET
Basophils Absolute: 0.1 10*3/uL (ref 0.0–0.1)
Basophils Relative: 1.1 % (ref 0.0–3.0)
Eosinophils Absolute: 0.2 10*3/uL (ref 0.0–0.7)
Eosinophils Relative: 3.5 % (ref 0.0–5.0)
HCT: 40.5 % (ref 36.0–46.0)
Hemoglobin: 13.5 g/dL (ref 12.0–15.0)
Lymphocytes Relative: 39.5 % (ref 12.0–46.0)
Lymphs Abs: 2.1 10*3/uL (ref 0.7–4.0)
MCHC: 33.4 g/dL (ref 30.0–36.0)
MCV: 94 fl (ref 78.0–100.0)
Monocytes Absolute: 0.3 10*3/uL (ref 0.1–1.0)
Monocytes Relative: 5.9 % (ref 3.0–12.0)
Neutro Abs: 2.6 10*3/uL (ref 1.4–7.7)
Neutrophils Relative %: 50 % (ref 43.0–77.0)
Platelets: 270 10*3/uL (ref 150.0–400.0)
RBC: 4.31 Mil/uL (ref 3.87–5.11)
RDW: 13.7 % (ref 11.5–15.5)
WBC: 5.2 10*3/uL (ref 4.0–10.5)

## 2019-12-07 LAB — CELIAC PANEL 10
Antigliadin Abs, IgA: 2 units (ref 0–19)
Endomysial IgA: NEGATIVE
Gliadin IgG: 1 units (ref 0–19)
IgA/Immunoglobulin A, Serum: 72 mg/dL — ABNORMAL LOW (ref 87–352)
Tissue Transglut Ab: <2 U/mL (ref 0–5)
Transglutaminase IgA: <2 U/mL (ref 0–3)

## 2019-12-07 NOTE — Telephone Encounter (Signed)
Patient has elected to have the CT completed at [facility] in [facility];

## 2019-12-07 NOTE — Telephone Encounter (Signed)
Suzette with [employer] called requesting order for CT abd pelvis with contrast be faxed over to [facility] as pt wants to have test done there. Fax # is [phone].

## 2019-12-07 NOTE — Telephone Encounter (Signed)
Information faxed to requested number.

## 2019-12-07 NOTE — Telephone Encounter (Signed)
Noted  

## 2019-12-07 NOTE — Progress Notes (Signed)
Reviewed and agree with management plan.  Leena Tiede T. Andre Swander, MD FACG  Gastroenterology  

## 2019-12-07 NOTE — Telephone Encounter (Signed)
Pt had an OV yesterday and stated that she was given instructions for CT but was not scheduled for CT and [facility].

## 2019-12-07 NOTE — Telephone Encounter (Signed)
Bre, pls contact the patient by the end of the week and let me know if decided to have the CT or not thx

## 2019-12-07 NOTE — Telephone Encounter (Signed)
Called and spoke with patient- she voiced concerns that she is not actually scheduled for a CT scan but received instructions for this test at her [place]; patient reports she called the pre cert center to find out how much the CT scan would cost her and she was informed she was not scheduled for a CT scan and that is why she is contacting the office at this time;   patient advised of number to call to have CT scan scheduled [phone]) as she requested this number due to patient wanting to know cost prior to scheduling scan; patient also advised of code for pre cert center to enter [phone]) for billing purposes and she reports she will call and find out if she "can even afford this scan right now-then I will call and schedule it if I want to go ahead with this";  Patient voiced concern about drinking the contrast when she would have shown up and not actually been able to complete the scan; RN assured patient this matter would be looked into and for her to call/contact the office should further questions/concerns arise;  Patient verbalized understanding of information/instructions and was appreciative of the assistance;

## 2019-12-07 NOTE — Telephone Encounter (Signed)
Please see additional charting concerning this patient

## 2019-12-15 ENCOUNTER — Ambulatory Visit: Payer: BC Managed Care – PPO

## 2019-12-19 ENCOUNTER — Ambulatory Visit: Payer: BC Managed Care – PPO

## 2019-12-29 ENCOUNTER — Ambulatory Visit (INDEPENDENT_AMBULATORY_CARE_PROVIDER_SITE_OTHER): Payer: BC Managed Care – PPO

## 2019-12-29 ENCOUNTER — Other Ambulatory Visit: Payer: Self-pay

## 2019-12-29 DIAGNOSIS — E538 Deficiency of other specified B group vitamins: Secondary | ICD-10-CM

## 2019-12-29 MED ORDER — CYANOCOBALAMIN 1000 MCG/ML IJ SOLN
1000.0000 ug | Freq: Once | INTRAMUSCULAR | Status: AC
  Administered 2019-12-29: 1000 ug via INTRAMUSCULAR

## 2019-12-29 NOTE — Progress Notes (Addendum)
Patricia Arista Brownis a 56 year oldfemalepresents to the office today for Vitamin B12injections, per physician's orders.  Original order:07/14/2019 -B12 injections by nurse visit every 4 Weeks x 12 doses Vitamin B12, 1019mcg, IMwas administered left deltoidtoday. Patient tolerated injection. Patient due for follow up labs/provider appt:No . Date due:07/12/2020, appt madeNo Patient next injection due:01/29/2020, appt made:Yes   Malek Skog BakerLPN    Pt with vit [id number] deficiency. Agree with vit B12 1000 mcg IM in office today.  Electronically Signed by: Howard Pouch, DO [place] [facility] [place]

## 2020-01-19 NOTE — Progress Notes (Signed)
Pt with vit B12 deficiency.  Agree with vit B12 1000 mcg IM in office today. Signed:  Phil Mujtaba Bollig, MD           01/19/2020  

## 2020-01-19 NOTE — Progress Notes (Signed)
Pt with vit B12 deficiency.  Agree with vit B12 1000 mcg IM in office today. Signed:  Phil Raynesha Tiedt, MD           01/19/2020  

## 2020-01-19 NOTE — Progress Notes (Signed)
Pt with vit B12 deficiency.  Agree with vit B12 1000 mcg IM in office today. Signed:  Phil Shayanna Thatch, MD           01/19/2020  

## 2020-01-26 ENCOUNTER — Ambulatory Visit (INDEPENDENT_AMBULATORY_CARE_PROVIDER_SITE_OTHER): Payer: BC Managed Care – PPO

## 2020-01-26 ENCOUNTER — Other Ambulatory Visit: Payer: Self-pay

## 2020-01-26 DIAGNOSIS — D2272 Melanocytic nevi of left lower limb, including hip: Secondary | ICD-10-CM | POA: Diagnosis not present

## 2020-01-26 DIAGNOSIS — D2271 Melanocytic nevi of right lower limb, including hip: Secondary | ICD-10-CM | POA: Diagnosis not present

## 2020-01-26 DIAGNOSIS — D2261 Melanocytic nevi of right upper limb, including shoulder: Secondary | ICD-10-CM | POA: Diagnosis not present

## 2020-01-26 DIAGNOSIS — E538 Deficiency of other specified B group vitamins: Secondary | ICD-10-CM | POA: Diagnosis not present

## 2020-01-26 DIAGNOSIS — D2262 Melanocytic nevi of left upper limb, including shoulder: Secondary | ICD-10-CM | POA: Diagnosis not present

## 2020-01-26 MED ORDER — CYANOCOBALAMIN 1000 MCG/ML IJ SOLN
1000.0000 ug | Freq: Once | INTRAMUSCULAR | Status: AC
  Administered 2020-01-26: 1000 ug via INTRAMUSCULAR

## 2020-01-26 NOTE — Progress Notes (Addendum)
Patricia Fluet Brownis a 62 year oldfemalepresents to the office today for Vitamin B12injections, per physician's orders.  Original order:07/14/2019 -B12 injections by nurse visit every 4 Weeks x 12 doses Vitamin B12, 1081mcg, IMwas administered left deltoidtoday. Patient tolerated injection. Patient due for follow up labs/provider appt:No . Date due:07/12/2020, appt madeNo Patient next injection due:02/25/2020, appt made:Yes  Sent to Dr Raoul Pitch to review and sign.   [name]  Electronically Signed by: Howard Pouch, DO [place] [facility] [place]

## 2020-01-30 ENCOUNTER — Ambulatory Visit: Payer: BC Managed Care – PPO

## 2020-02-28 ENCOUNTER — Other Ambulatory Visit: Payer: Self-pay

## 2020-02-28 ENCOUNTER — Ambulatory Visit (INDEPENDENT_AMBULATORY_CARE_PROVIDER_SITE_OTHER): Payer: BC Managed Care – PPO

## 2020-02-28 DIAGNOSIS — E538 Deficiency of other specified B group vitamins: Secondary | ICD-10-CM | POA: Diagnosis not present

## 2020-02-28 MED ORDER — CYANOCOBALAMIN 1000 MCG/ML IJ SOLN
1000.0000 ug | Freq: Once | INTRAMUSCULAR | Status: AC
  Administered 2020-02-28: 1000 ug via INTRAMUSCULAR

## 2020-02-28 NOTE — Progress Notes (Signed)
Patricia Delmedico Brownis a 19 year oldfemalepresents to the office today for Vitamin B12injections, per physician's orders.  Original order:07/14/2019-B12 injections by nurse visit every 4 Weeks x 12 doses Vitamin B12, 1086mcg, IMwas administered right deltoidtoday. Patient tolerated injection. Patient due for follow up labs/provider appt:No. Date due:07/12/2020, appt madeNo Patient next injection due:03/30/2020, appt made:Yes Sent to Dr Raoul Pitch to review and sign.   Deveron Furlong, CMA

## 2020-02-29 ENCOUNTER — Ambulatory Visit: Payer: BC Managed Care – PPO

## 2020-03-27 ENCOUNTER — Ambulatory Visit: Payer: BC Managed Care – PPO

## 2020-04-04 ENCOUNTER — Ambulatory Visit: Payer: BC Managed Care – PPO

## 2020-04-05 ENCOUNTER — Ambulatory Visit (INDEPENDENT_AMBULATORY_CARE_PROVIDER_SITE_OTHER): Payer: BC Managed Care – PPO

## 2020-04-05 ENCOUNTER — Other Ambulatory Visit: Payer: Self-pay

## 2020-04-05 ENCOUNTER — Telehealth: Payer: Self-pay

## 2020-04-05 DIAGNOSIS — E538 Deficiency of other specified B group vitamins: Secondary | ICD-10-CM

## 2020-04-05 MED ORDER — CYANOCOBALAMIN 1000 MCG/ML IJ SOLN
1000.0000 ug | Freq: Once | INTRAMUSCULAR | Status: AC
  Administered 2020-04-05: 1000 ug via INTRAMUSCULAR

## 2020-04-05 NOTE — Progress Notes (Addendum)
Patricia Alvarez a 62 year oldfemalepresents to the office today for Vitamin B12injections, per physician's orders.  Original order:07/14/2019-B12 injections by nurse visit every 4 Weeks x 12 doses Vitamin B12, 1061mcg, IMwas administered L deltoidtoday. Patient tolerated injection. Patient due for follow up labs/provider appt:No. Date due:07/12/2020, appt madeNo Patient next injection due:04/29/2020, appt made:Yes Sent to Dr Raoul Pitch to review and sign.  Patricia Rancher LPN   Medical screening examination/treatment/procedure(s) were performed by non-physician practitioner and as supervising physician I was immediately available for consultation/collaboration.  I agree with above assessment and plan.  Electronically Signed by: Howard Pouch, DO [place] [facility]

## 2020-04-05 NOTE — Telephone Encounter (Signed)
Patient was in office today needing to schedule her next B12 injection. Needing a 3:00 or later time slot was told she needed approval for this from you. Let patient know you were out of office and we could call when you came back in office     Please call and advise

## 2020-04-09 NOTE — Telephone Encounter (Signed)
Patient typically gets her B12 injections on Tuesday, This should be okay to schedule.  Thanks!

## 2020-04-21 DIAGNOSIS — T673XXA Heat exhaustion, anhydrotic, initial encounter: Secondary | ICD-10-CM | POA: Diagnosis not present

## 2020-04-21 DIAGNOSIS — R42 Dizziness and giddiness: Secondary | ICD-10-CM | POA: Diagnosis not present

## 2020-04-21 DIAGNOSIS — Z79899 Other long term (current) drug therapy: Secondary | ICD-10-CM | POA: Diagnosis not present

## 2020-04-21 DIAGNOSIS — E860 Dehydration: Secondary | ICD-10-CM | POA: Diagnosis not present

## 2020-05-02 ENCOUNTER — Encounter: Payer: Self-pay | Admitting: Diagnostic Neuroimaging

## 2020-05-02 ENCOUNTER — Other Ambulatory Visit: Payer: Self-pay

## 2020-05-02 ENCOUNTER — Other Ambulatory Visit: Payer: Self-pay | Admitting: Obstetrics and Gynecology

## 2020-05-02 ENCOUNTER — Ambulatory Visit: Payer: BC Managed Care – PPO | Admitting: Diagnostic Neuroimaging

## 2020-05-02 VITALS — BP 106/67 | HR 72 | Ht 65.0 in | Wt 141.6 lb

## 2020-05-02 DIAGNOSIS — G43109 Migraine with aura, not intractable, without status migrainosus: Secondary | ICD-10-CM | POA: Diagnosis not present

## 2020-05-02 DIAGNOSIS — Z1231 Encounter for screening mammogram for malignant neoplasm of breast: Secondary | ICD-10-CM

## 2020-05-02 MED ORDER — RIZATRIPTAN BENZOATE 10 MG PO TBDP: 10.0000 mg | ORAL_TABLET | ORAL | 12 refills | Status: DC | PRN

## 2020-05-02 NOTE — Progress Notes (Signed)
[facility]  PATIENT: Patricia Alvarez DOB: [birth year]  REFERRING CLINICIAN: Raoul Pitch HISTORY FROM: patient  REASON FOR VISIT: follow up   HISTORICAL  CHIEF COMPLAINT:  Chief Complaint  Patient presents with  . Migraine    rm 6 "requested FU due to episode June 27th of loss of awareness; had to use Rizatriptan 3 days in a row last week, unusual for me"    HISTORY OF PRESENT ILLNESS:   UPDATE (05/02/20, VRP): Since last visit, doing well. Avg 1 HA every 6-8 weeks. Was at beach in June 2021, dehydrated, lack of sleep, went to store and then got dizzy, confused, almost passed out. Pinpoint pupils, staggering, memory lapse. May have accidentally taken part of ambien that morning with her meds. Went to ER, had eval, dx'd with dehydration. Now back to normal.   UPDATE (11/08/18, VRP): Since last visit, doing well. She reports that she has had a migraine about once or twice a month since being seen in July, 2019. She reports that the migraine is usually located behind her ear (usually right sided but can occur on the left). Pain is described as a pulsating, pressure sensation that radiates up to the top of her head. She will have light sensitivity and occasionally nausea with migraine. She can lie in a dark room with light pressure to her head and headaches improve. She has noted an unusual smell prior to migraine. She is taking Maxalt with onset of migraine and this usually helps. She feels that she usually has another migraine the following day that is relieved with Maxalt as well. She rarely takes an Excedrin for non migrainous headaches. She denies stroke like symptoms with migraine. She is contributing the improvement to treatment of vitamin B12 and vitamin D deficiency. She has been treated with B12 injections as well as oral supplements. She is also taking vitamin D and magnesium supplements OTC. She reports that her numbness and tingling in her right hand and arm is  significantly improved.   PRIOR HPI (05/03/2018, VRP): 59 year old female here for evaluation of headaches.  Patient had onset of headaches sometime in her late 22s.  She describes pounding, unilateral, mainly left-sided, throbbing severe headaches associated with nausea, blurred vision, sensitivity to sound, seeing dots and spots.  Headaches were intermittent for many years.  Patient treated these with over-the-counter medications.  The last couple of years patient has had slight change in her headaches with sharp shooting pain in her left ear, left eye, facial numbness, lip numbness, drooling sensation, arm numbness.  These also were associated with headaches.  Patient feeling headaches and pain behind her ears and behind her head.  Headaches increased in April 2019.  Now patient having headaches 3-4 times per week.  Patient had a CT scan and MRI of the brain, as well as emergency room evaluation, and was diagnosed with probable complicated migraine.  She was prescribed amitriptyline but has not tried it yet.  Patient has family history of migraine in her mother.  No specific triggering factors currently for her headaches.  Patient has a long history of somewhat interrupted sleep and mild insomnia.  She is able to fall asleep without difficulty but then tends to wake up in the middle the night and has difficulty falling back asleep.    REVIEW OF SYSTEMS: Full 14 system review of systems performed and negative with exception of: as per HPI.   ALLERGIES: Allergies  Allergen Reactions  . Codeine Itching and Nausea Only  REACTION: nausea  . Morphine Nausea Only    REACTION: nausea  . Prednisone     REACTION: Hyperactive    HOME MEDICATIONS: Outpatient Medications Prior to Visit  Medication Sig Dispense Refill  . aspirin-acetaminophen-caffeine (EXCEDRIN MIGRAINE) 250-250-65 MG tablet Take by mouth as needed for headache.     . rizatriptan (MAXALT-MLT) 10 MG disintegrating tablet Take 1  tablet (10 mg total) by mouth as needed for migraine. May repeat in 2 hours if needed 9 tablet 12  . vitamin B-12 (CYANOCOBALAMIN) 1000 MCG tablet Take 1,000 mcg by mouth daily.    . Vitamin D, Ergocalciferol, 2000 units CAPS Take 2,000 Units by mouth daily.    Marland Kitchen dicyclomine (BENTYL) 10 MG capsule Take 1 capsule (10 mg total) by mouth every 8 (eight) hours as needed for spasms. (Patient not taking: Reported on 05/02/2020) 30 capsule 0  . estradiol (ESTRACE) 0.5 MG tablet Take 0.5 mg by mouth daily. (Patient not taking: Reported on 05/02/2020)  3  . famotidine (PEPCID) 20 MG tablet Take 1 tablet (20 mg total) by mouth 2 (two) times daily. (Patient not taking: Reported on 05/02/2020) 60 tablet 1  . omeprazole (PRILOSEC) 40 MG capsule Take 1 capsule (40 mg total) by mouth daily. (Patient not taking: Reported on 05/02/2020) 90 capsule 3  . magnesium 30 MG tablet Take 30 mg by mouth daily. (Patient not taking: Reported on 05/02/2020)     No facility-administered medications prior to visit.    PAST MEDICAL HISTORY: Past Medical History:  Diagnosis Date  . Allergic rhinitis   . Allergy   . B12 deficiency   . Back pain   . Chicken pox   . Colon polyp 2015   TUBULAR ADENOMA (X 1).  . DDD (degenerative disc disease), cervical   . Endometriosis of uterus   . GERD (gastroesophageal reflux disease)   . Hyperlipidemia   . IBS (irritable bowel syndrome)   . Internal hemorrhoid   . Migraines   . Rectal bleeding   . Vitamin D deficiency     PAST SURGICAL HISTORY: Past Surgical History:  Procedure Laterality Date  . APPENDECTOMY  02/2000   and ovary  . AUGMENTATION MAMMAPLASTY Bilateral 1998   Saline,retropectoral   . COLONOSCOPY W/ BIOPSIES  2015   multiple  . ESOPHAGOGASTRODUODENOSCOPY  2011   multiple  . LAPAROSCOPY    . OOPHORECTOMY Left 02/2000   w/appendectomy  . PARTIAL HYSTERECTOMY  1994   right ovary and uterus  . TOTAL HIP ARTHROPLASTY Right 08/2018   August 31, 2018    FAMILY  HISTORY: Family History  Problem Relation Age of Onset  . Hyperlipidemia Mother 66  . Heart failure Mother 14  . Arthritis Mother   . Asthma Mother   . Hearing loss Mother   . Heart disease Mother   . Hypertension Mother   . Miscarriages / [place] Mother   . Colon polyps Father   . Stroke Father   . Hearing loss Father   . Breast cancer Maternal Grandmother 6  . Colon cancer Neg Hx   . Esophageal cancer Neg Hx   . Rectal cancer Neg Hx   . Stomach cancer Neg Hx     SOCIAL HISTORY:  Social History   Socioeconomic History  . Marital status: Married    Spouse name: Not on file  . Number of children: 2  . Years of education: 36  . Highest education level: Not on file  Occupational History  . Occupation: legal  assistant    Employer: [employer]  Tobacco Use  . Smoking status: Never Smoker  . Smokeless tobacco: Never Used  Vaping Use  . Vaping Use: Never used  Substance and Sexual Activity  . Alcohol use: No  . Drug use: No  . Sexual activity: Yes    Partners: Male    Birth control/protection: Surgical  Other Topics Concern  . Not on file  Social History Narrative   Married. 2 children.    HS graduate. Employed as a Herbalist.    Uses bicycle helmet, smoke alarm in the home.   Feels safe in her relationships.   Caffeine- unsweet 1 quart most days.    Social Determinants of Health   Financial Resource Strain:   . Difficulty of Paying Living Expenses:   Food Insecurity:   . Worried About Charity fundraiser in the Last Year:   . Arboriculturist in the Last Year:   Transportation Needs:   . Film/video editor (Medical):   Marland Kitchen Lack of Transportation (Non-Medical):   Physical Activity:   . Days of Exercise per Week:   . Minutes of Exercise per Session:   Stress:   . Feeling of Stress :   Social Connections:   . Frequency of Communication with Friends and Family:   . Frequency of Social Gatherings with Friends and Family:   . Attends  Religious Services:   . Active Member of Clubs or Organizations:   . Attends Archivist Meetings:   Marland Kitchen Marital Status:   Intimate Partner Violence:   . Fear of Current or Ex-Partner:   . Emotionally Abused:   Marland Kitchen Physically Abused:   . Sexually Abused:      PHYSICAL EXAM  GENERAL EXAM/CONSTITUTIONAL: Vitals:  Vitals:   05/02/20 1431  BP: 106/67  Pulse: 72  Weight: 141 lb 9.6 oz (64.2 kg)  Height: 5\' 5"  (1.651 m)   Body mass index is 23.56 kg/m. No exam data present  Patient is in no distress; well developed, nourished and groomed; neck is supple  CARDIOVASCULAR:  Regular rate and rhythm, no murmurs  Examination of peripheral vascular system by observati  on and palpation is normal  EYES:  Ophthalmoscopic exam of optic discs and posterior segments is normal; no papilledema or hemorrhages  MUSCULOSKELETAL:  Gait, strength, tone, movements noted in Neurologic exam below  NEUROLOGIC: MENTAL STATUS:  No flowsheet data found.  awake, alert, oriented to person, place and time  recent and remote memory intact  normal attention and concentration  language fluent, comprehension intact, naming intact,   fund of knowledge appropriate  CRANIAL NERVE:   2nd - no papilledema on fundoscopic exam  2nd, 3rd, 4th, 6th - pupils equal and reactive to light, visual fields full to confrontation, extraocular muscles intact, no nystagmus  5th - facial sensation symmetric  7th - facial strength symmetric  8th - hearing intact  9th - palate elevates symmetrically, uvula midline  11th - shoulder shrug symmetric  12th - tongue protrusion midline  MOTOR:   normal bulk and tone, full strength in the BUE, BLE   SENSORY:   normal and symmetric to light touch, temperature, vibration  COORDINATION:   finger-nose-finger  REFLEXES:   deep tendon reflexes present and symmetric  GAIT/STATION:   narrow based gait    DIAGNOSTIC DATA (LABS, IMAGING,  TESTING) - I reviewed patient records, labs, notes, testing and imaging myself where available.  Lab Results  Component Value Date  WBC 5.2 12/06/2019   HGB 13.5 12/06/2019   HCT 40.5 12/06/2019   MCV 94.0 12/06/2019   PLT 270.0 12/06/2019      Component Value Date/Time   NA 138 12/06/2019 1632   NA 143 05/25/2019 0000   K 4.5 12/06/2019 1632   CL 103 12/06/2019 1632   CO2 30 12/06/2019 1632   GLUCOSE 92 12/06/2019 1632   BUN 9 12/06/2019 1632   BUN 11 05/25/2019 0000   CREATININE 0.61 12/06/2019 1632   CALCIUM 9.1 12/06/2019 1632   PROT 6.9 12/06/2019 1632   ALBUMIN 4.2 12/06/2019 1632   AST 17 12/06/2019 1632   ALT 8 12/06/2019 1632   ALKPHOS 68 12/06/2019 1632   BILITOT 0.4 12/06/2019 1632   GFRNONAA >60 04/15/2018 1137   GFRAA >60 04/15/2018 1137   Lab Results  Component Value Date   CHOL 229 (H) 07/13/2019   HDL 51.50 07/13/2019   LDLCALC 148 (H) 07/13/2019   LDLDIRECT 168.7 01/06/2008   TRIG 145.0 07/13/2019   CHOLHDL 4 07/13/2019   Lab Results  Component Value Date   HGBA1C 5.6 07/13/2019   Lab Results  Component Value Date   VITAMINB12 950 (H) 07/13/2019   Lab Results  Component Value Date   TSH 2.42 07/13/2019   VITD  Date Value Ref Range Status  07/13/2019 41.32 30.00 - 100.00 ng/mL Final  12/24/2018 43.80 30.00 - 100.00 ng/mL Final  05/07/2018 34.77 30.00 - 100.00 ng/mL Final    04/15/18 MRI brain [I reviewed images myself and agree with interpretation. -VRP]  - No acute or focal intracranial abnormality. Good general agreement with prior normal CT. - Minor foci of white matter signal abnormality, stable since 2060, uncertain, but likely doubtful, clinical significance.     ASSESSMENT AND PLAN  59 y.o. year old female here with history of migraine with aura, with worsening headaches since April 2019.  Neurologic examination and MRI of the brain are unremarkable.  Likely represents worsening of patient's existing migraine.  Patient has  had several attacks associated with neurologic/strokelike symptoms, which may represent complicated migraine.    Dx: complicated migraine  1. Migraine with aura and without status migrainosus, not intractable      PLAN:  MIGRAINE WITH AURA - continue rizatriptan as needed for migraine rescue  DEHYDRATION / UNRESPONSIVE SPELL / SYNCOPE (June 2021; possible accidental ingestion of ambien) - monitor symptoms; may consider MRI and EEG in future if sxs recur  Meds ordered this encounter  Medications  . rizatriptan (MAXALT-MLT) 10 MG disintegrating tablet    Sig: Take 1 tablet (10 mg total) by mouth as needed for migraine. May repeat in 2 hours if needed    Dispense:  9 tablet    Refill:  12     Return in about 1 year (around 05/02/2021) for with NP (Amy Lomax).    Penni Bombard, MD 11/01/6151, 7:94 PM Certified in Neurology, Neurophysiology and Neuroimaging  [facility] [address] [place], [place] [address] [phone]

## 2020-05-02 NOTE — Patient Instructions (Signed)
MIGRAINE WITH AURA - continue rizatriptan as needed for migraine rescue  DEHYDRATION / UNRESPONSIVE SPELL / SYNCOPE (June 2021; possible accidental ingestion of ambien) - monitor symptoms; may consider MRI and EEG in future if sxs recur

## 2020-05-07 ENCOUNTER — Ambulatory Visit: Payer: BC Managed Care – PPO | Admitting: Family Medicine

## 2020-05-10 ENCOUNTER — Ambulatory Visit: Payer: BC Managed Care – PPO

## 2020-05-16 ENCOUNTER — Ambulatory Visit: Payer: BC Managed Care – PPO

## 2020-05-18 ENCOUNTER — Ambulatory Visit (INDEPENDENT_AMBULATORY_CARE_PROVIDER_SITE_OTHER): Payer: BC Managed Care – PPO

## 2020-05-18 ENCOUNTER — Other Ambulatory Visit: Payer: Self-pay

## 2020-05-18 DIAGNOSIS — E538 Deficiency of other specified B group vitamins: Secondary | ICD-10-CM

## 2020-05-18 MED ORDER — CYANOCOBALAMIN 1000 MCG/ML IJ SOLN
1000.0000 ug | Freq: Once | INTRAMUSCULAR | Status: AC
  Administered 2020-05-18: 1000 ug via INTRAMUSCULAR

## 2020-05-18 NOTE — Progress Notes (Signed)
Patricia Piotrowski Brownis a 38 year oldfemalepresents to the office today for Vitamin B12injections, per physician's orders.  Original order:07/14/2019-B12 injections by nurse visit every 4 Weeks x 12 doses Vitamin B12, 1070mcg, IMwas administeredLdeltoidtoday. Patient tolerated injection. Patient due for follow up labs/provider appt:No. Date due:07/12/2020, appt madeNo Patient next injection due:06/22/2020, appt made:No Sent to Dr Raoul Pitch to review and sign.  Deveron Furlong, CMA

## 2020-05-24 DIAGNOSIS — Z01419 Encounter for gynecological examination (general) (routine) without abnormal findings: Secondary | ICD-10-CM | POA: Diagnosis not present

## 2020-05-24 DIAGNOSIS — Z13220 Encounter for screening for lipoid disorders: Secondary | ICD-10-CM | POA: Diagnosis not present

## 2020-05-24 DIAGNOSIS — Z6822 Body mass index (BMI) 22.0-22.9, adult: Secondary | ICD-10-CM | POA: Diagnosis not present

## 2020-05-24 DIAGNOSIS — Z13228 Encounter for screening for other metabolic disorders: Secondary | ICD-10-CM | POA: Diagnosis not present

## 2020-05-24 DIAGNOSIS — Z13820 Encounter for screening for osteoporosis: Secondary | ICD-10-CM | POA: Diagnosis not present

## 2020-05-29 ENCOUNTER — Encounter: Payer: Self-pay | Admitting: Gastroenterology

## 2020-05-29 ENCOUNTER — Ambulatory Visit: Payer: BC Managed Care – PPO | Admitting: Gastroenterology

## 2020-05-29 VITALS — BP 90/60 | HR 80 | Ht 64.0 in | Wt 138.5 lb

## 2020-05-29 DIAGNOSIS — K9041 Non-celiac gluten sensitivity: Secondary | ICD-10-CM | POA: Diagnosis not present

## 2020-05-29 NOTE — Patient Instructions (Signed)
Remain on a gluten free diet.   Follow up as needed.   Thank you for choosing me and [facility].  Pricilla Riffle. Dagoberto Ligas., MD., [facility]

## 2020-05-29 NOTE — Progress Notes (Signed)
° ° °  History of Present Illness: This is a 59 year old female with GERD, lower abdominal pain and cramping and abdominal bloating. Her celiac panel was negative in 11/2019 however with the low IgA at 72 the results are inconclusive.  Following the inconclusive celiac panel she decided to try a gluten-free diet and she had a substantial improvement in her gastrointestinal symptoms.  With this improvement she decided to strictly and continuously follow a gluten-free type diet going forward.  Since her office visit in February her granddaughter, age 62, was diagnosed with celiac disease which was apparently confirmed by duodenal biopsies. This was the first confirmed case of celiac disease in her family.  She has also noted popcorn sensitivity with even small amounts of popcorn leading to significant diarrhea so she now avoids popcorn.  She has infrequent acid reflux symptoms occurring about twice per week and takes omeprazole daily as needed, usually 2 times per week.  In reviewing her records a celiac panel had not been checked prior to 2021.  Current Medications, Allergies, Past Medical History, Past Surgical History, Family History and Social History were reviewed in [employer] record.   Physical Exam: General: Well developed, well nourished, no acute distress Head: Normocephalic and atraumatic Eyes:  sclerae anicteric, EOMI Ears: Normal auditory acuity Mouth: Not examined, mask on during Covid-19 pandemic Lungs: Clear throughout to auscultation Heart: Regular rate and rhythm; no murmurs, rubs or bruits Abdomen: Soft, non tender and non distended. No masses, hepatosplenomegaly or hernias noted. Normal Bowel sounds Rectal: Not prn Musculoskeletal: Symmetrical with no gross deformities  Pulses:  Normal pulses noted Extremities: No clubbing, cyanosis, edema or deformities noted Neurological: Alert oriented x 4, grossly nonfocal Psychological:  Alert and cooperative. Normal  mood and affect   Assessment and Recommendations:  1. 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 Granddaughter diagnosed recently with celiac disease which was apparently confirmed by duodenal biopsies.  I apologized to her for not checking a celiac panel prior to 2021 which we should have done.  I offered her the option of HLA celiac testing and we discussed the benefits and limitations.  I offered the option of EGD with duodenal biopsies however since she has been on a gluten-free diet the biopsies could be normal even if she had celiac disease.  In addition we discussed the option to return to regular gluten intake and proceed with an upper endoscopy following 1 to 2 months of regular gluten intake.  At this point since she feels so well she is very comfortable remaining on a strict gluten-free diet long-term.  She is comfortable without any further testing.  If she changes her mind we can rediscuss options in the future.  2. IgA deficiency.  3. GERD. Continue antireflux measures long term. Omeprazole 20 mg OTC 1-2 po qd prn. REV prn.   4. Personal history of adenomatous colon polyps. Surveillance colonoscopy recommended in January 2027.

## 2020-06-11 ENCOUNTER — Ambulatory Visit: Payer: BC Managed Care – PPO | Admitting: Gastroenterology

## 2020-06-13 ENCOUNTER — Other Ambulatory Visit: Payer: Self-pay

## 2020-06-13 ENCOUNTER — Ambulatory Visit (INDEPENDENT_AMBULATORY_CARE_PROVIDER_SITE_OTHER): Payer: BC Managed Care – PPO

## 2020-06-13 DIAGNOSIS — E538 Deficiency of other specified B group vitamins: Secondary | ICD-10-CM

## 2020-06-13 MED ORDER — CYANOCOBALAMIN 1000 MCG/ML IJ SOLN
1000.0000 ug | Freq: Once | INTRAMUSCULAR | Status: AC
  Administered 2020-06-13: 1000 ug via INTRAMUSCULAR

## 2020-06-13 NOTE — Progress Notes (Addendum)
Patricia Alvarez is a 59 y.o. female presents to the office today for B12 injections, per physician's orders. Original order: 07/14/2019 cyanocoblalmin, 1000mg ,  IM was administered left deltoid today. Patient tolerated injection. Patient due for follow up labs/provider appt: Yes. Date due: 07/13/2020, appt made Yes Patient next injection due: 09/18//21, appt made Yes  [place] screening examination/treatment/procedure(s) were performed by non-physician practitioner and as supervising physician I was immediately available for consultation/collaboration.  I agree with above assessment and plan.  Electronically Signed by: Howard Pouch, DO [place] [facility] [place]

## 2020-06-14 ENCOUNTER — Ambulatory Visit: Payer: BC Managed Care – PPO | Admitting: Family Medicine

## 2020-06-15 ENCOUNTER — Ambulatory Visit: Payer: BC Managed Care – PPO

## 2020-06-19 ENCOUNTER — Other Ambulatory Visit: Payer: Self-pay

## 2020-06-19 ENCOUNTER — Ambulatory Visit
Admission: RE | Admit: 2020-06-19 | Discharge: 2020-06-19 | Disposition: A | Discharge status: Home / Self Care | Payer: BC Managed Care – PPO | Source: Ambulatory Visit | Attending: Obstetrics and Gynecology | Admitting: Obstetrics and Gynecology

## 2020-06-19 DIAGNOSIS — Z1231 Encounter for screening mammogram for malignant neoplasm of breast: Secondary | ICD-10-CM

## 2020-06-22 DIAGNOSIS — M67431 Ganglion, right wrist: Secondary | ICD-10-CM | POA: Diagnosis not present

## 2020-07-11 ENCOUNTER — Ambulatory Visit: Payer: BC Managed Care – PPO

## 2020-07-13 ENCOUNTER — Encounter: Payer: Self-pay | Admitting: Family Medicine

## 2020-07-13 ENCOUNTER — Ambulatory Visit (INDEPENDENT_AMBULATORY_CARE_PROVIDER_SITE_OTHER): Payer: BC Managed Care – PPO | Admitting: Family Medicine

## 2020-07-13 ENCOUNTER — Other Ambulatory Visit: Payer: Self-pay

## 2020-07-13 VITALS — BP 95/65 | HR 72 | Temp 97.7°F | Resp 16 | Ht 64.37 in | Wt 138.2 lb

## 2020-07-13 DIAGNOSIS — E559 Vitamin D deficiency, unspecified: Secondary | ICD-10-CM

## 2020-07-13 DIAGNOSIS — Z23 Encounter for immunization: Secondary | ICD-10-CM | POA: Diagnosis not present

## 2020-07-13 DIAGNOSIS — Z0000 Encounter for general adult medical examination without abnormal findings: Secondary | ICD-10-CM | POA: Diagnosis not present

## 2020-07-13 DIAGNOSIS — E782 Mixed hyperlipidemia: Secondary | ICD-10-CM

## 2020-07-13 DIAGNOSIS — E538 Deficiency of other specified B group vitamins: Secondary | ICD-10-CM

## 2020-07-13 DIAGNOSIS — Z79899 Other long term (current) drug therapy: Secondary | ICD-10-CM

## 2020-07-13 DIAGNOSIS — Z131 Encounter for screening for diabetes mellitus: Secondary | ICD-10-CM | POA: Diagnosis not present

## 2020-07-13 DIAGNOSIS — M792 Neuralgia and neuritis, unspecified: Secondary | ICD-10-CM

## 2020-07-13 DIAGNOSIS — Z86010 Personal history of colonic polyps: Secondary | ICD-10-CM

## 2020-07-13 LAB — LIPID PANEL
Cholesterol: 218 mg/dL — ABNORMAL HIGH (ref 0–200)
HDL: 49.6 mg/dL (ref >39.00)
LDL Cholesterol: 144 mg/dL — ABNORMAL HIGH (ref 0–99)
NonHDL: 168.75
Total CHOL/HDL Ratio: 4
Triglycerides: 126 mg/dL (ref 0.0–149.0)
VLDL: 25.2 mg/dL (ref 0.0–40.0)

## 2020-07-13 LAB — VITAMIN D 25 HYDROXY (VIT D DEFICIENCY, FRACTURES): VITD: 37.68 ng/mL (ref 30.00–100.00)

## 2020-07-13 LAB — VITAMIN B12: Vitamin B-12: 701 pg/mL (ref 211–911)

## 2020-07-13 LAB — TSH: TSH: 4.21 u[IU]/mL (ref 0.35–4.50)

## 2020-07-13 LAB — HEMOGLOBIN A1C: Hgb A1c MFr Bld: 5.7 % (ref 4.6–6.5)

## 2020-07-13 MED ORDER — CYANOCOBALAMIN 1000 MCG/ML IJ SOLN
1000.0000 ug | Freq: Once | INTRAMUSCULAR | Status: AC
  Administered 2020-07-13: 1000 ug via INTRAMUSCULAR

## 2020-07-13 NOTE — Patient Instructions (Signed)
Health Maintenance, Female Adopting a healthy lifestyle and getting preventive care are important in promoting health and wellness. Ask your health care provider about:  The right schedule for you to have regular tests and exams.  Things you can do on your own to prevent diseases and keep yourself healthy. What should I know about diet, weight, and exercise? Eat a healthy diet   Eat a diet that includes plenty of vegetables, fruits, low-fat dairy products, and lean protein.  Do not eat a lot of foods that are high in solid fats, added sugars, or sodium. Maintain a healthy weight Body mass index (BMI) is used to identify weight problems. It estimates body fat based on height and weight. Your health care provider can help determine your BMI and help you achieve or maintain a healthy weight. Get regular exercise Get regular exercise. This is one of the most important things you can do for your health. Most adults should:  Exercise for at least 150 minutes each week. The exercise should increase your heart rate and make you sweat (moderate-intensity exercise).  Do strengthening exercises at least twice a week. This is in addition to the moderate-intensity exercise.  Spend less time sitting. Even light physical activity can be beneficial. Watch cholesterol and blood lipids Have your blood tested for lipids and cholesterol at 58 years of age, then have this test every 5 years. Have your cholesterol levels checked more often if:  Your lipid or cholesterol levels are high.  You are older than 58 years of age.  You are at high risk for heart disease. What should I know about cancer screening? Depending on your health history and family history, you may need to have cancer screening at various ages. This may include screening for:  Breast cancer.  Cervical cancer.  Colorectal cancer.  Skin cancer.  Lung cancer. What should I know about heart disease, diabetes, and high blood  pressure? Blood pressure and heart disease  High blood pressure causes heart disease and increases the risk of stroke. This is more likely to develop in people who have high blood pressure readings, are of African descent, or are overweight.  Have your blood pressure checked: ? Every 3-5 years if you are 18-39 years of age. ? Every year if you are 40 years old or older. Diabetes Have regular diabetes screenings. This checks your fasting blood sugar level. Have the screening done:  Once every three years after age 40 if you are at a normal weight and have a low risk for diabetes.  More often and at a younger age if you are overweight or have a high risk for diabetes. What should I know about preventing infection? Hepatitis B If you have a higher risk for hepatitis B, you should be screened for this virus. Talk with your health care provider to find out if you are at risk for hepatitis B infection. Hepatitis C Testing is recommended for:  Everyone born from 1945 through 1965.  Anyone with known risk factors for hepatitis C. Sexually transmitted infections (STIs)  Get screened for STIs, including gonorrhea and chlamydia, if: ? You are sexually active and are younger than 58 years of age. ? You are older than 58 years of age and your health care provider tells you that you are at risk for this type of infection. ? Your sexual activity has changed since you were last screened, and you are at increased risk for chlamydia or gonorrhea. Ask your health care provider if   you are at risk.  Ask your health care provider about whether you are at high risk for HIV. Your health care provider may recommend a prescription medicine to help prevent HIV infection. If you choose to take medicine to prevent HIV, you should first get tested for HIV. You should then be tested every 3 months for as long as you are taking the medicine. Pregnancy  If you are about to stop having your period (premenopausal) and  you may become pregnant, seek counseling before you get pregnant.  Take 400 to 800 micrograms (mcg) of folic acid every day if you become pregnant.  Ask for birth control (contraception) if you want to prevent pregnancy. Osteoporosis and menopause Osteoporosis is a disease in which the bones lose minerals and strength with aging. This can result in bone fractures. If you are 65 years old or older, or if you are at risk for osteoporosis and fractures, ask your health care provider if you should:  Be screened for bone loss.  Take a calcium or vitamin D supplement to lower your risk of fractures.  Be given hormone replacement therapy (HRT) to treat symptoms of menopause. Follow these instructions at home: Lifestyle  Do not use any products that contain nicotine or tobacco, such as cigarettes, e-cigarettes, and chewing tobacco. If you need help quitting, ask your health care provider.  Do not use street drugs.  Do not share needles.  Ask your health care provider for help if you need support or information about quitting drugs. Alcohol use  Do not drink alcohol if: ? Your health care provider tells you not to drink. ? You are pregnant, may be pregnant, or are planning to become pregnant.  If you drink alcohol: ? Limit how much you use to 0-1 drink a day. ? Limit intake if you are breastfeeding.  Be aware of how much alcohol is in your drink. In the [place] one drink equals one 12 oz bottle of beer (355 mL), one 5 oz glass of wine (148 mL), or one 1 oz glass of hard liquor (44 mL). General instructions  Schedule regular health, dental, and eye exams.  Stay current with your vaccines.  Tell your health care provider if: ? You often feel depressed. ? You have ever been abused or do not feel safe at home. Summary  Adopting a healthy lifestyle and getting preventive care are important in promoting health and wellness.  Follow your health care provider's instructions about healthy  diet, exercising, and getting tested or screened for diseases.  Follow your health care provider's instructions on monitoring your cholesterol and blood pressure. This information is not intended to replace advice given to you by your health care provider. Make sure you discuss any questions you have with your health care provider. Document Revised: 10/06/2018 Document Reviewed: 10/06/2018 Elsevier Patient Education  2020 Elsevier Inc.  

## 2020-07-13 NOTE — Progress Notes (Signed)
This visit occurred during the SARS-CoV-2 public health emergency.  Safety protocols were in place, including screening questions prior to the visit, additional usage of staff PPE, and extensive cleaning of exam room while observing appropriate contact time as indicated for disinfecting solutions.    Patient ID: Patricia Alvarez, female  DOB: Apr 05, 1961, 59 y.o.   MRN: [id number] Patient Care Team    Relationship Specialty Notifications Start End  Ma Hillock, DO PCP - General Family Medicine  02/17/18   Ladene Artist, MD Consulting Physician Gastroenterology  02/17/18   Delsa Sale, OD  Optometry  02/17/18   Molli Posey, MD Consulting Physician Obstetrics and Gynecology  02/17/18     Chief Complaint  Patient presents with   Annual Exam    cpe    Subjective:  Patricia Alvarez is a 59 y.o.  Female  present for CPE. All past medical history, surgical history, allergies, family history, immunizations, medications and social history were updated in the electronic medical record today. All recent labs, ED visits and hospitalizations within the last year were reviewed.  B12/Vit d def: pt is continue her q 4 week injections of b12 in the office. This is a standing order for her as long as she follows every 12 months with cpe and recheck on b12. She feels the B12 has worked great for her.  Health maintenance:  Colonoscopy: completed 10/2018, by Dr. Fuller Plan. follow up 5 yr. Mammogram: completed:06/19/2020, birads Dr.Holland.  Cervical cancer screening: last pap: 2020,, completed by: Dr. Matthew Saras 3-10yr Immunizations: tdap UTD 2015, Influenza completed today (encouraged yearly), shingrix declined today, COVID series completed Infectious disease screening: HIV and Hep C completed DEXA: completed at GYN- records requested Patient has a Dental home. Hospitalizations/ED visits: reviewed  Depression screen [facility] 2/9 07/13/2020 07/13/2019 12/24/2018 07/28/2018 02/17/2018  Decreased Interest 0 0 0  0 0  Down, Depressed, Hopeless 0 0 0 0 0  PHQ - 2 Score 0 0 0 0 0   No flowsheet data found.   Immunization History  Administered Date(s) Administered   Influenza,inj,Quad PF,6+ Mos 07/28/2018, 07/13/2019   PFIZER SARS-COV-2 Vaccination 01/11/2020, 02/08/2020   Td 06/14/2001   Tdap 04/27/2014    Past Medical History:  Diagnosis Date   Allergic rhinitis    Allergy    B12 deficiency    Back pain    Chicken pox    Colon polyp 2015   TUBULAR ADENOMA (X 1).   DDD (degenerative disc disease), cervical    Endometriosis of uterus    GERD (gastroesophageal reflux disease)    Hyperlipidemia    IBS (irritable bowel syndrome)    Internal hemorrhoid    Migraines    Rectal bleeding    Vitamin D deficiency    Allergies  Allergen Reactions   Codeine Itching and Nausea Only    REACTION: nausea   Gluten Meal Other (See Comments)    Stomach swells    Morphine Nausea Only    REACTION: nausea   Prednisone     REACTION: Hyperactive   Past Surgical History:  Procedure Laterality Date   APPENDECTOMY  02/2000   and ovary   AUGMENTATION MAMMAPLASTY Bilateral 1998   Saline,retropectoral    COLONOSCOPY W/ BIOPSIES  2015   multiple   ESOPHAGOGASTRODUODENOSCOPY  2011   multiple   LAPAROSCOPY     OOPHORECTOMY Left 02/2000   w/appendectomy   PARTIAL HYSTERECTOMY  1994   right ovary and uterus   TOTAL HIP ARTHROPLASTY Right  08/2018   August 31, 2018   Family History  Problem Relation Age of Onset   Hyperlipidemia Mother 41   Heart failure Mother 37   Arthritis Mother    Asthma Mother    Hearing loss Mother    Heart disease Mother    Hypertension Mother    Miscarriages / [place] Mother    Colon polyps Father    Stroke Father    Hearing loss Father    Breast cancer Maternal Grandmother 66   Colon cancer Neg Hx    Esophageal cancer Neg Hx    Rectal cancer Neg Hx    Stomach cancer Neg Hx    Social History   Social  History Narrative   Married. 2 children.    HS graduate. Employed as a Herbalist.    Uses bicycle helmet, smoke alarm in the home.   Feels safe in her relationships.   Caffeine- unsweet 1 quart most days.     Allergies as of 07/13/2020      Reactions   Codeine Itching, Nausea Only   REACTION: nausea   Gluten Meal Other (See Comments)   Stomach swells    Morphine Nausea Only   REACTION: nausea   Prednisone    REACTION: Hyperactive      Medication List       Accurate as of July 13, 2020  8:47 AM. If you have any questions, ask your nurse or doctor.        aspirin-acetaminophen-caffeine 250-250-65 MG tablet Commonly known as: EXCEDRIN MIGRAINE Take by mouth as needed for headache.   estradiol 0.5 MG tablet Commonly known as: ESTRACE Take 0.5 mg by mouth daily.   omeprazole 40 MG capsule Commonly known as: PRILOSEC Take 1 capsule (40 mg total) by mouth daily. What changed:   when to take this  reasons to take this   rizatriptan 10 MG disintegrating tablet Commonly known as: MAXALT-MLT Take 1 tablet (10 mg total) by mouth as needed for migraine. May repeat in 2 hours if needed   vitamin B-12 1000 MCG tablet Commonly known as: CYANOCOBALAMIN Take 1,000 mcg by mouth daily.   Vitamin D (Ergocalciferol) 50 MCG (2000 UT) Caps Take 2,000 Units by mouth daily.       All past medical history, surgical history, allergies, family history, immunizations andmedications were updated in the EMR today and reviewed under the history and medication portions of their EMR.     No results found for this or any previous visit (from the past 2160 hour(s)).  MM 3D SCREEN BREAST W/IMPLANT BILATERAL  Result Date: 06/19/2020 CLINICAL DATA:  Screening. EXAM: DIGITAL SCREENING BILATERAL MAMMOGRAM WITH IMPLANTS, CAD AND TOMO The patient has retropectoral saline implants. Standard and implant displaced views were performed. COMPARISON:  Previous exam(s). ACR Breast Density  Category b: There are scattered areas of fibroglandular density. FINDINGS: There are no findings suspicious for malignancy. Saline implants are intact. Images were processed with CAD. IMPRESSION: No mammographic evidence of malignancy. A result letter of this screening mammogram will be mailed directly to the patient. RECOMMENDATION: Screening mammogram in one year. (Code:SM-B-01Y) BI-RADS CATEGORY  1:  Negative. Electronically Signed   By: Valentino Saxon MD   On: 06/19/2020 13:58     ROS: 14 pt review of systems performed and negative (unless mentioned in an HPI)  Objective: BP 95/65 (BP Location: Left Arm, Patient Position: Sitting, Cuff Size: Normal)    Pulse 72    Temp 97.7 F (36.5 C) (Oral)  Resp 16    Ht 5' 4.37" (1.635 m)    Wt 138 lb 3.2 oz (62.7 kg)    SpO2 98%    BMI 23.45 kg/m  Gen: Afebrile. No acute distress. Nontoxic in appearance, well-developed, well-nourished,  Pleasant female.  HENT: AT. [place]. Bilateral TM visualized and normal in appearance, normal external auditory canal. MMM, no oral lesions, adequate dentition. Bilateral nares within normal limits. Throat without erythema, ulcerations or exudates. no Cough on exam, no hoarseness on exam. Eyes:Pupils Equal Round Reactive to light, Extraocular movements intact,  Conjunctiva without redness, discharge or icterus. Neck/lymp/endocrine: Supple,no lymphadenopathy, no thyromegaly CV: RRR no murmru, no edema, +2/4 P posterior tibialis pulses. No JVD. Chest: CTAB, no wheeze, rhonchi or crackles. normal Respiratory effort. good Air movement. Abd: Soft. flat. NTND. BS present. no Masses palpated. No hepatosplenomegaly. No rebound tenderness or guarding. Skin: no rashes, purpura or petechiae. Warm and well-perfused. Skin intact. Neuro/Msk: Normal gait. PERLA. EOMi. Alert. Oriented x3.  Cranial nerves II through XII intact. Muscle strength 5/5 upper/lower extremity. DTRs equal bilaterally. Psych: Normal affect, dress and demeanor.  Normal speech. Normal thought content and judgment.   No exam data present  Assessment/plan: Patricia Alvarez is a 59 y.o. female present for CPE  Encounter for preventative adult health care examination Patient was encouraged to exercise greater than 150 minutes a week. Patient was encouraged to choose a diet filled with fresh fruits and vegetables, and lean meats. AVS provided to patient today for education/recommendation on gender specific health and safety maintenance. Colonoscopy: completed 10/2018, by Dr. Fuller Plan. follow up 5 yr. Mammogram: completed:06/19/2020, birads Dr.Holland.  Cervical cancer screening: last pap: 2020,, completed by: Dr. Matthew Saras 3-40yr Immunizations: tdap UTD 2015, Influenza completed today (encouraged yearly), shingrix declined today- she wants to check her insurance coverage> ok to schedule a nurse visit if she desires, COVID series completed Infectious disease screening: HIV and Hep C completed DEXA: completed at GYN- records requested   B12 deficiency Standing order for b12 q 4 weeks by nurse visit.  - cyanocobalamin ((VITAMIN B-12)) injection 1,000 mcg - B12 Vitamin D deficiency - Vitamin D (25 hydroxy) Mixed hyperlipidemia Diet and routine exercise.  - TSH - Lipid panel Hx of adenomatous colonic polyps Colonoscopy UTD Diabetes mellitus screening - Hemoglobin A1c  Flu shot administered today.   Return in about 1 year (around 07/13/2021) for CPE (30 min).  Orders Placed This Encounter  Procedures   B12   Vitamin D (25 hydroxy)   Hemoglobin A1c   TSH   Lipid panel   Meds ordered this encounter  Medications   cyanocobalamin ((VITAMIN B-12)) injection 1,000 mcg   Referral Orders  No referral(s) requested today     Electronically signed by: Howard Pouch, [place]

## 2020-07-16 ENCOUNTER — Telehealth: Payer: Self-pay | Admitting: Family Medicine

## 2020-07-16 NOTE — Telephone Encounter (Signed)
Please call patient thyroid function is normal Diabetes screening/A1c is normal at 5.7 Vitamin D levels are normal at 37 B12 levels are normal at 701-may continue B12 injections every 4 weeks x 12.  Levels will be checked at CPE yearly. Cholesterol panel is good.  Total cholesterol 218, HDL 49, LDL 144 and triglycerides 126.  This is at goal for her age, gender and medical history.

## 2020-07-16 NOTE — Telephone Encounter (Signed)
Verified pt understanding. Pt will call to schedule next injection

## 2020-07-23 ENCOUNTER — Telehealth: Payer: Self-pay | Admitting: Diagnostic Neuroimaging

## 2020-07-23 NOTE — Telephone Encounter (Signed)
Pt called stating she is needing a refill on her rizatriptan (MAXALT-MLT) 10 MG disintegrating tablet sent and approved to the [facility]

## 2020-07-23 NOTE — Telephone Encounter (Signed)
Called [facility], spoke with Roderic Palau and advised him patient should have refills on file. He confirmed, stated she needs to call and request refill. Called patient and advised her of above. She stated she received a text from [facility] stating she needed to call our office. I advised she should have refills. Patient verbalized understanding, appreciation.

## 2020-07-26 ENCOUNTER — Telehealth: Payer: Self-pay

## 2020-07-26 DIAGNOSIS — E538 Deficiency of other specified B group vitamins: Secondary | ICD-10-CM

## 2020-07-26 MED ORDER — CYANOCOBALAMIN 1000 MCG/ML IJ SOLN
1000.0000 ug | INTRAMUSCULAR | Status: AC
  Administered 2020-08-09 – 2021-05-30 (×8): 1000 ug via INTRAMUSCULAR

## 2020-07-26 NOTE — Telephone Encounter (Addendum)
-  Patient has been approved for B12 injection every 4 weeks indefinitely, as long as follows up yearly for her CPE.  -Shingrix injection was not approved by nurse visit.  You can find these referrals in her last office visit note 07/13/2020.   Thanks.

## 2020-07-26 NOTE — Telephone Encounter (Signed)
Called pt regarding record from GYN Dr. Toma Deiters needing a medical release form to be signed. Pt will come and sign form.   Pt wanted to also sched for b12 and Shingles. Pt aware that is an order that has to be . approved by the provider

## 2020-07-26 NOTE — Addendum Note (Signed)
Addended by: Howard Pouch A on: 07/26/2020 04:33 PM   Modules accepted: Orders

## 2020-07-27 NOTE — Telephone Encounter (Signed)
LVM for pt to CB in regards to upcoming appt.  Need to know if pt insurance will cover shingrix?  Per Raoul Pitch Note: shingrix declined today- she wants to check her insurance coverage> ok to schedule a nurse visit if she desires.

## 2020-08-09 ENCOUNTER — Ambulatory Visit (INDEPENDENT_AMBULATORY_CARE_PROVIDER_SITE_OTHER): Payer: BC Managed Care – PPO

## 2020-08-09 ENCOUNTER — Other Ambulatory Visit: Payer: Self-pay

## 2020-08-09 DIAGNOSIS — E538 Deficiency of other specified B group vitamins: Secondary | ICD-10-CM

## 2020-08-09 DIAGNOSIS — Z23 Encounter for immunization: Secondary | ICD-10-CM

## 2020-08-09 NOTE — Progress Notes (Signed)
  Patricia Alvarez is a 59 y.o. female presents to the office today for B12 injections, per physician's orders. Original order: 07/14/2019 cyanocoblalmin, 1000mg ,  IM was administered left deltoid today. Patient tolerated injection. Patient due for follow up labs/provider appt: Yes. Date due: n/a, appt made no Patient next injection due: 11/14//21, appt made No  Octaviano Glow

## 2020-09-18 ENCOUNTER — Ambulatory Visit (INDEPENDENT_AMBULATORY_CARE_PROVIDER_SITE_OTHER): Payer: BC Managed Care – PPO

## 2020-09-18 ENCOUNTER — Other Ambulatory Visit: Payer: Self-pay

## 2020-09-18 DIAGNOSIS — E538 Deficiency of other specified B group vitamins: Secondary | ICD-10-CM

## 2020-09-18 NOTE — Progress Notes (Signed)
Pt here today for monthly B12 shot. Shot was given with no issue. Rx provided by office.    

## 2020-12-05 ENCOUNTER — Other Ambulatory Visit: Payer: Self-pay

## 2020-12-05 ENCOUNTER — Ambulatory Visit (INDEPENDENT_AMBULATORY_CARE_PROVIDER_SITE_OTHER): Payer: Self-pay

## 2020-12-05 DIAGNOSIS — E538 Deficiency of other specified B group vitamins: Secondary | ICD-10-CM

## 2020-12-05 MED ORDER — CYANOCOBALAMIN 1000 MCG/ML IJ SOLN
1000.0000 ug | Freq: Once | INTRAMUSCULAR | Status: AC
  Administered 2020-12-05: 1000 ug via INTRAMUSCULAR

## 2020-12-05 NOTE — Progress Notes (Signed)
Patient here for b12 injection per Dr. Raoul Pitch.  Injection given in left deltoid and patient tolerated well.

## 2020-12-24 ENCOUNTER — Ambulatory Visit: Payer: No Typology Code available for payment source | Admitting: Gastroenterology

## 2020-12-24 ENCOUNTER — Encounter: Payer: Self-pay | Admitting: Gastroenterology

## 2020-12-24 ENCOUNTER — Other Ambulatory Visit (INDEPENDENT_AMBULATORY_CARE_PROVIDER_SITE_OTHER): Payer: No Typology Code available for payment source

## 2020-12-24 VITALS — BP 90/60 | HR 73 | Ht 65.0 in | Wt 141.0 lb

## 2020-12-24 DIAGNOSIS — Z86010 Personal history of colonic polyps: Secondary | ICD-10-CM

## 2020-12-24 DIAGNOSIS — R1013 Epigastric pain: Secondary | ICD-10-CM

## 2020-12-24 DIAGNOSIS — K9041 Non-celiac gluten sensitivity: Secondary | ICD-10-CM

## 2020-12-24 DIAGNOSIS — K219 Gastro-esophageal reflux disease without esophagitis: Secondary | ICD-10-CM

## 2020-12-24 LAB — CBC WITH DIFFERENTIAL/PLATELET
Basophils Absolute: 0 10*3/uL (ref 0.0–0.1)
Basophils Relative: 1 % (ref 0.0–3.0)
Eosinophils Absolute: 0.1 10*3/uL (ref 0.0–0.7)
Eosinophils Relative: 1.7 % (ref 0.0–5.0)
HCT: 40.1 % (ref 36.0–46.0)
Hemoglobin: 13.7 g/dL (ref 12.0–15.0)
Lymphocytes Relative: 41 % (ref 12.0–46.0)
Lymphs Abs: 2 10*3/uL (ref 0.7–4.0)
MCHC: 34.2 g/dL (ref 30.0–36.0)
MCV: 92.1 fl (ref 78.0–100.0)
Monocytes Absolute: 0.3 10*3/uL (ref 0.1–1.0)
Monocytes Relative: 6.3 % (ref 3.0–12.0)
Neutro Abs: 2.4 10*3/uL (ref 1.4–7.7)
Neutrophils Relative %: 50 % (ref 43.0–77.0)
Platelets: 264 10*3/uL (ref 150.0–400.0)
RBC: 4.36 Mil/uL (ref 3.87–5.11)
RDW: 13.8 % (ref 11.5–15.5)
WBC: 4.9 10*3/uL (ref 4.0–10.5)

## 2020-12-24 LAB — COMPREHENSIVE METABOLIC PANEL
ALT: 7 U/L (ref 0–35)
AST: 15 U/L (ref 0–37)
Albumin: 4.2 g/dL (ref 3.5–5.2)
Alkaline Phosphatase: 68 U/L (ref 39–117)
BUN: 10 mg/dL (ref 6–23)
CO2: 28 mEq/L (ref 19–32)
Calcium: 9.2 mg/dL (ref 8.4–10.5)
Chloride: 102 mEq/L (ref 96–112)
Creatinine, Ser: 0.6 mg/dL (ref 0.40–1.20)
GFR: 99.03 mL/min (ref >60.00)
Glucose, Bld: 93 mg/dL (ref 70–99)
Potassium: 4.1 mEq/L (ref 3.5–5.1)
Sodium: 138 mEq/L (ref 135–145)
Total Bilirubin: 0.4 mg/dL (ref 0.2–1.2)
Total Protein: 6.8 g/dL (ref 6.0–8.3)

## 2020-12-24 LAB — TSH: TSH: 3.75 u[IU]/mL (ref 0.35–4.50)

## 2020-12-24 LAB — LIPASE: Lipase: 29 U/L (ref 11.0–59.0)

## 2020-12-24 MED ORDER — DICYCLOMINE HCL 10 MG PO CAPS: 10.0000 mg | ORAL_CAPSULE | Freq: Three times a day (TID) | ORAL | 11 refills | Status: DC

## 2020-12-24 NOTE — Progress Notes (Signed)
---     History of Present Illness: This is a 60 year old female with GERD and gluten intolerance vs celiac disease.  She complains of postprandial nausea, epigastric pain and episodes of diarrhea.  She generally notes nausea and epigastric pain following her evening meal.  She generally has a bowel movement every day however she often has incomplete evacuation.  Intermittently she will have several days at a time with multiple episodes of loose stools.  She is unclear as to what precipitates these changes.  She notes reflux symptoms mainly at night.  She closely follows a gluten-free diet.   Current Medications, Allergies, Past Medical History, Past Surgical History, Family History and Social History were reviewed in [employer] record.   Physical Exam: General: Well developed, well nourished, no acute distress Head: Normocephalic and atraumatic Eyes: Sclerae anicteric, EOMI Ears: Normal auditory acuity Mouth: Not examined, mask on during Covid-19 pandemic Lungs: Clear throughout to auscultation Heart: Regular rate and rhythm; no murmurs, rubs or bruits Abdomen: Soft, mild epigastric tenderness to deep palpation and non distended. No masses, hepatosplenomegaly or hernias noted. Normal Bowel sounds Rectal: Not done Musculoskeletal: Symmetrical with no gross deformities  Pulses:  Normal pulses noted Extremities: No clubbing, cyanosis, edema or deformities noted Neurological: Alert oriented x 4, grossly nonfocal Psychological:  Alert and cooperative. Normal mood and affect   Assessment and Recommendations:  1.  Postprandial epigastric pain, nausea and an alternating bowel pattern.  Rule out IBS, cholelithiasis, other disorders. CBC, CMP, lipase, TSH.  Schedule CT AP. Dicyclomine 10 mg p.o. 3 times daily AC as needed.  If no diagnosis established and symptoms persist consider RUQ  [place] and EGD. REV in 1 month.   2. GERD. Follow antireflux measures and change famotidine  to 20 po bid.   3. Gluten intolerance vs celiac disease. Continue gluten free diet.   4. Personal history of adenomatous colon polyps. Surveillance colonoscopy is recommended in Jan 2027.

## 2020-12-24 NOTE — Patient Instructions (Addendum)
Your provider has requested that you go to the basement level for lab work before leaving today. Press "B" on the elevator. The lab is located at the first door on the left as you exit the elevator.  We have sent the following medications to your pharmacy for you to pick up at your convenience: dicyclomine.   You have been scheduled for a CT scan of the abdomen and pelvis at [facility] ([address]---this is in the same building as [employer]).   You are scheduled on 12/26/20 at [facility] should arrive 15 minutes prior to your appointment time for registration. Please follow the written instructions below on the day of your exam:  WARNING: IF YOU ARE ALLERGIC TO IODINE/X-RAY DYE, PLEASE NOTIFY RADIOLOGY IMMEDIATELY AT [phone]! YOU WILL BE GIVEN A 13 HOUR PREMEDICATION PREP.  1) Do not eat or drink anything after 6:00am (4 hours prior to your test) 2) You have been given 2 bottles of oral contrast to drink. The solution may taste better if refrigerated, but do NOT add ice or any other liquid to this solution. Shake well before drinking.    Drink 1 bottle of contrast @ 8:00am (2 hours prior to your exam)  Drink 1 bottle of contrast @ 9:00am (1 hour prior to your exam)  You may take any medications as prescribed with a small amount of water, if necessary. If you take any of the following medications: METFORMIN, GLUCOPHAGE, GLUCOVANCE, AVANDAMET, RIOMET, FORTAMET, [place] MET, JANUMET, GLUMETZA or METAGLIP, you MAY be asked to HOLD this medication 48 hours AFTER the exam.  The purpose of you drinking the oral contrast is to aid in the visualization of your intestinal tract. The contrast solution may cause some diarrhea. Depending on your individual set of symptoms, you may also receive an intravenous injection of x-ray contrast/dye. Plan on being at [facility] for 30 minutes or longer, depending on the type of exam you are having performed.  This test  typically takes 30-45 minutes to complete.  If you have any questions regarding your exam or if you need to reschedule, you may call the CT department at [phone] between the hours of 8:00 am and 5:00 pm, Monday-Friday.  ________________________________________________________________________  Thank you for choosing me and [facility].  Pricilla Riffle. Dagoberto Ligas., MD., [facility]  Due to recent changes in healthcare laws, you may see the results of your imaging and laboratory studies on MyChart before your provider has had a chance to review them.  We understand that in some cases there may be results that are confusing or concerning to you. Not all laboratory results come back in the same time frame and the provider may be waiting for multiple results in order to interpret others.  Please give [place] 48 hours in order for your provider to thoroughly review all the results before contacting the office for clarification of your results.

## 2020-12-25 ENCOUNTER — Telehealth: Payer: Self-pay | Admitting: Gastroenterology

## 2020-12-25 NOTE — Telephone Encounter (Signed)
Taryn from CT states she spoke with the patient this morning and the patient states the CT instructions told her to drink a bottle of contrast at 6am this morning. Informed Nunzio Cobbs that I rechecked the instructions we gave patient yesterday and they are correct. It does not tell patient to drink contrast at 6am but to drink it at 8 and 9 am. Nunzio Cobbs states she just wanted to double check that the instructions are correct. Informed her that they are. Offered to call patient and Nunzio Cobbs states she had already spoke with patient and informed her drink contrast at 8 and 9 am.

## 2020-12-26 ENCOUNTER — Other Ambulatory Visit: Payer: Self-pay

## 2020-12-26 ENCOUNTER — Ambulatory Visit (INDEPENDENT_AMBULATORY_CARE_PROVIDER_SITE_OTHER)
Admission: RE | Admit: 2020-12-26 | Discharge: 2020-12-26 | Disposition: A | Discharge status: Home / Self Care | Payer: No Typology Code available for payment source | Source: Ambulatory Visit | Attending: Gastroenterology | Admitting: Gastroenterology

## 2020-12-26 DIAGNOSIS — R1013 Epigastric pain: Secondary | ICD-10-CM | POA: Diagnosis not present

## 2020-12-26 DIAGNOSIS — K219 Gastro-esophageal reflux disease without esophagitis: Secondary | ICD-10-CM

## 2020-12-26 DIAGNOSIS — Z860101 Personal history of adenomatous and serrated colon polyps: Secondary | ICD-10-CM

## 2020-12-26 DIAGNOSIS — Z86010 Personal history of colonic polyps: Secondary | ICD-10-CM | POA: Diagnosis not present

## 2020-12-26 DIAGNOSIS — K9041 Non-celiac gluten sensitivity: Secondary | ICD-10-CM

## 2020-12-26 MED ORDER — IOHEXOL 300 MG/ML  SOLN
100.0000 mL | Freq: Once | INTRAMUSCULAR | Status: AC | PRN
  Administered 2020-12-26: 100 mL via INTRAVENOUS

## 2021-01-02 ENCOUNTER — Other Ambulatory Visit: Payer: Self-pay

## 2021-01-02 ENCOUNTER — Ambulatory Visit (INDEPENDENT_AMBULATORY_CARE_PROVIDER_SITE_OTHER): Payer: No Typology Code available for payment source

## 2021-01-02 DIAGNOSIS — E538 Deficiency of other specified B group vitamins: Secondary | ICD-10-CM

## 2021-01-02 NOTE — Progress Notes (Signed)
Patricia Lotspeich Brownis a 60 y.o.femalepresents to the office today for B12injections, per physician's orders. Original order:07/14/2019 cyanocoblalmin,1000mg ,IMwas administered left deltoidtoday. Patient tolerated injection. Patient due for follow up labs/provide appt: Yes. Date due: 07/13/21, appt made No. Patient next injection due: 01/30/21, appt made Yes   Sheliah Hatch, CMA

## 2021-01-30 ENCOUNTER — Other Ambulatory Visit: Payer: Self-pay

## 2021-01-30 ENCOUNTER — Ambulatory Visit (INDEPENDENT_AMBULATORY_CARE_PROVIDER_SITE_OTHER): Payer: No Typology Code available for payment source

## 2021-01-30 DIAGNOSIS — E538 Deficiency of other specified B group vitamins: Secondary | ICD-10-CM

## 2021-01-30 DIAGNOSIS — Z23 Encounter for immunization: Secondary | ICD-10-CM | POA: Diagnosis not present

## 2021-01-30 MED ORDER — FAMOTIDINE 10 MG PO TABS: 10.0000 mg | ORAL_TABLET | Freq: Every day | ORAL | 1 refills | Status: DC

## 2021-01-30 NOTE — Progress Notes (Signed)
Patricia Alvarez a 60 y.o.femalepresents to the office today for B12injections, per physician's orders. Original order:07/14/2019 cyanocoblalmin,1000mg ,IMwas administered right deltoidtoday. Patient tolerated injection. Patient due for follow up labs/provide appt: Yes. Date due: 07/13/21, appt made No.

## 2021-02-01 ENCOUNTER — Telehealth: Payer: Self-pay

## 2021-02-01 ENCOUNTER — Other Ambulatory Visit: Payer: Self-pay

## 2021-02-01 MED ORDER — FAMOTIDINE 20 MG PO TABS: 20.0000 mg | ORAL_TABLET | Freq: Two times a day (BID) | ORAL | 1 refills | Status: DC

## 2021-02-01 NOTE — Telephone Encounter (Signed)
Resent Rx 

## 2021-02-01 NOTE — Telephone Encounter (Signed)
Patient checking on refill status.   famotidine (PEPCID) 10 MG tablet [[id number]]    Filled on 4/6 ???      Please follow up, pharmacy does not have approval.   [place]

## 2021-02-26 ENCOUNTER — Ambulatory Visit (INDEPENDENT_AMBULATORY_CARE_PROVIDER_SITE_OTHER): Payer: No Typology Code available for payment source

## 2021-02-26 ENCOUNTER — Other Ambulatory Visit: Payer: Self-pay

## 2021-02-26 DIAGNOSIS — E538 Deficiency of other specified B group vitamins: Secondary | ICD-10-CM

## 2021-02-26 NOTE — Progress Notes (Signed)
Patricia Zavada Brownis a 60 y.o.femalepresents to the office today for B12injections, per physician's orders. Original order:07/14/2019 cyanocoblalmin,1000mg ,IMwas administered left deltoidtoday. Patient tolerated injection.Patient due for follow up labs/provide appt: Yes. Date due: 07/13/21, appt made No.

## 2021-02-27 ENCOUNTER — Ambulatory Visit: Payer: No Typology Code available for payment source

## 2021-03-26 ENCOUNTER — Other Ambulatory Visit: Payer: Self-pay

## 2021-03-26 ENCOUNTER — Ambulatory Visit (INDEPENDENT_AMBULATORY_CARE_PROVIDER_SITE_OTHER): Payer: No Typology Code available for payment source

## 2021-03-26 DIAGNOSIS — E538 Deficiency of other specified B group vitamins: Secondary | ICD-10-CM

## 2021-03-26 NOTE — Progress Notes (Signed)
Patricia Alvarez a 60 y.o.femalepresents to the office today for B12injections, per physician's orders. Original order:07/14/2019 cyanocoblalmin,1000mg ,IMwas administeredright deltoidtoday. Patient tolerated injection.Patient due for follow up labs/provide appt: Yes. Date due: 07/13/21, appt made No.   Please sign in absence of PCP

## 2021-04-24 ENCOUNTER — Ambulatory Visit: Payer: No Typology Code available for payment source

## 2021-04-26 IMAGING — MG DIGITAL SCREENING BILATERAL MAMMOGRAM WITH IMPLANTS, CAD AND TOM
9 of 12 series · 9 of 28 positions shown · non-contrast
Comparison: Previous exam(s).

CLINICAL DATA: Screening.

EXAM:
DIGITAL SCREENING BILATERAL MAMMOGRAM WITH IMPLANTS, CAD AND TOMO
The patient has retropectoral implants. Standard and implant
displaced views were performed.

[R CC]
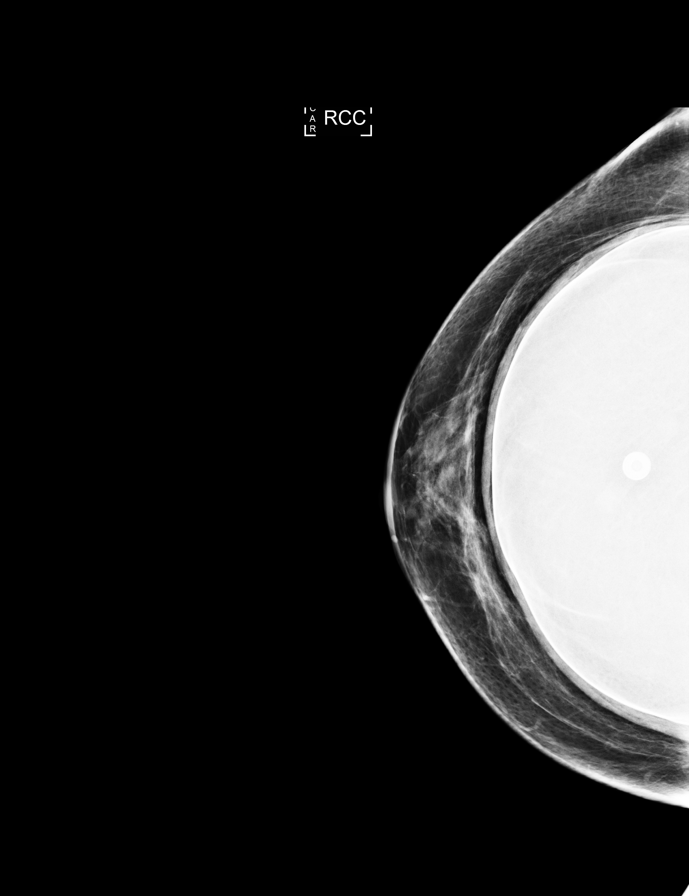

[L CC]
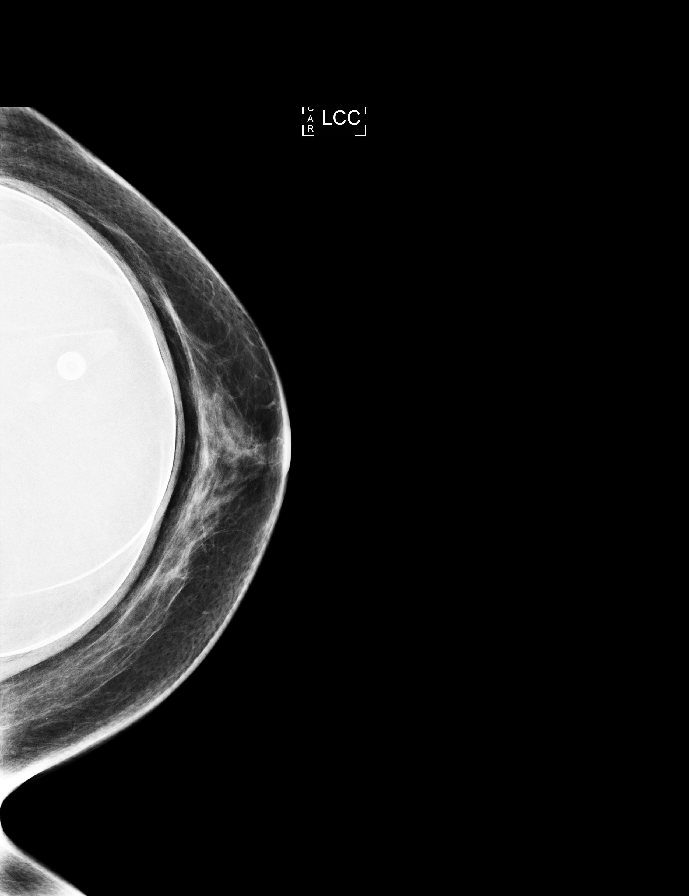

[R MLO]
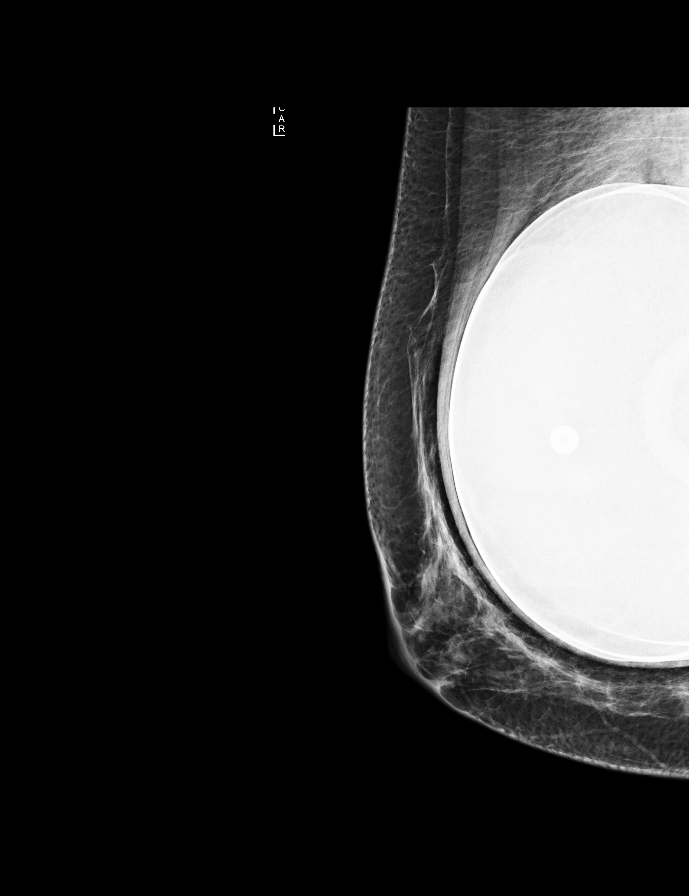

[L MLO]
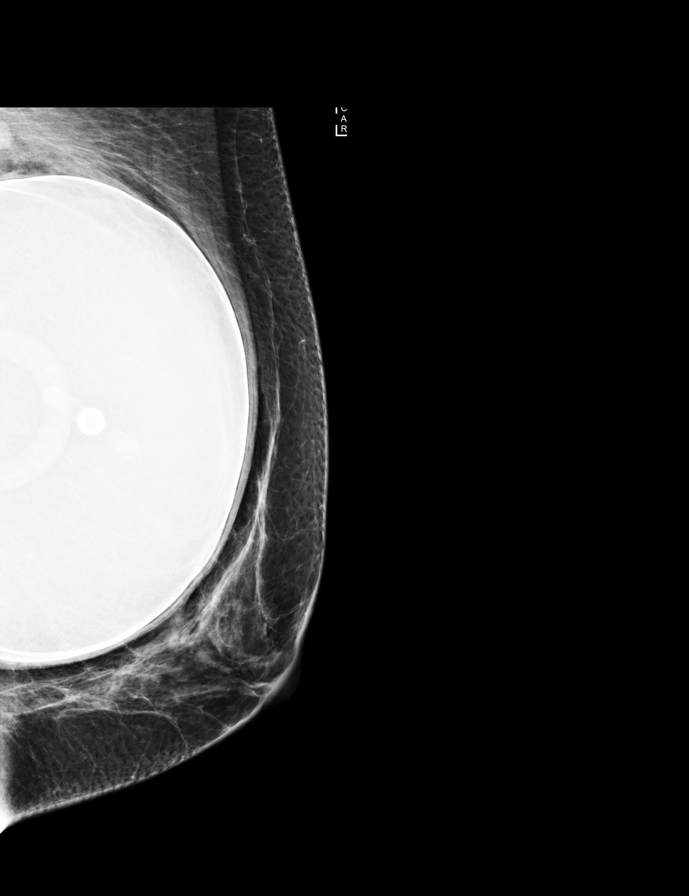

[R MLO synth-2D]
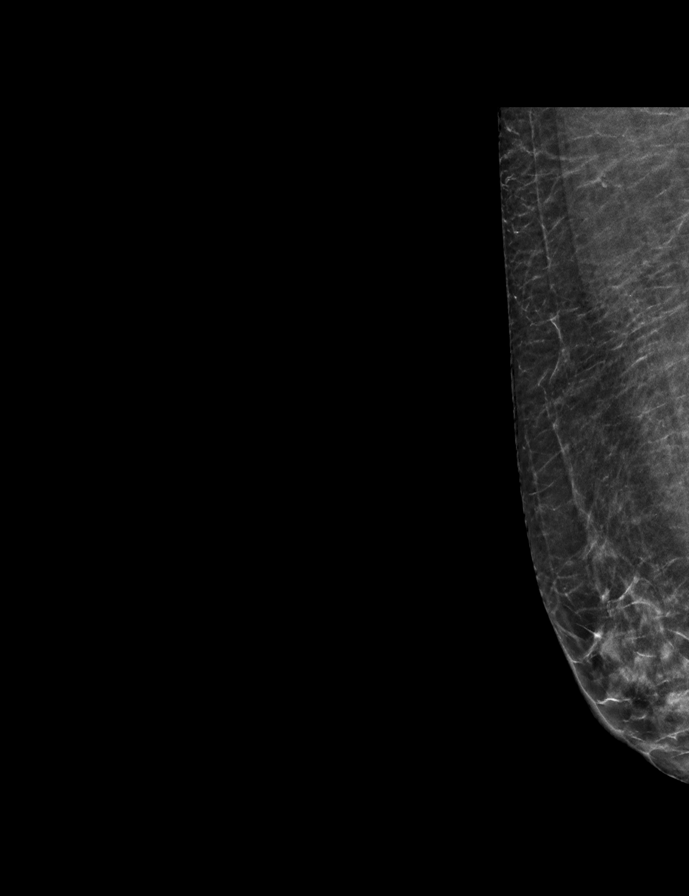

[L MLO synth-2D]
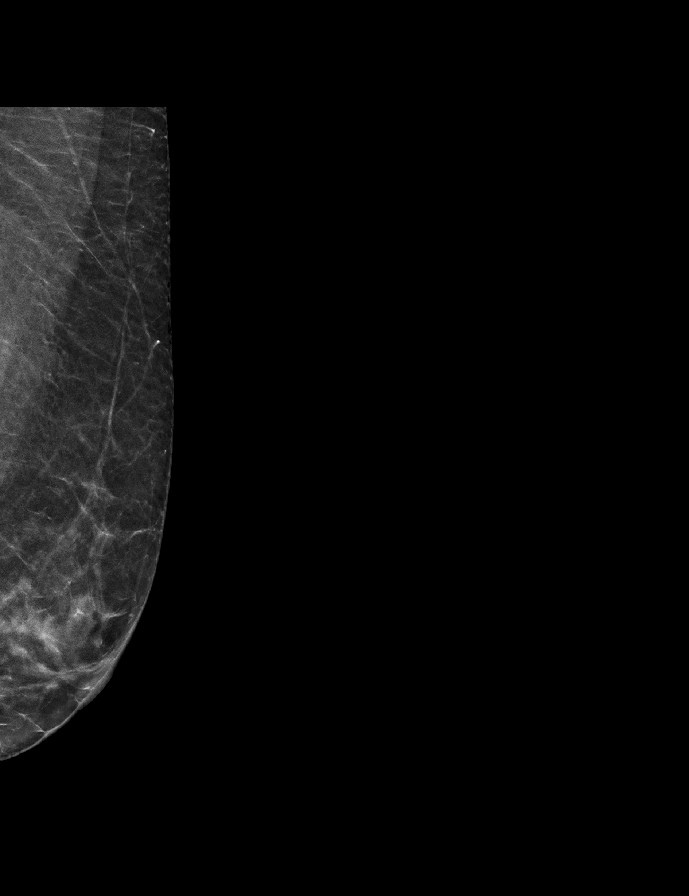

[R CC synth-2D]
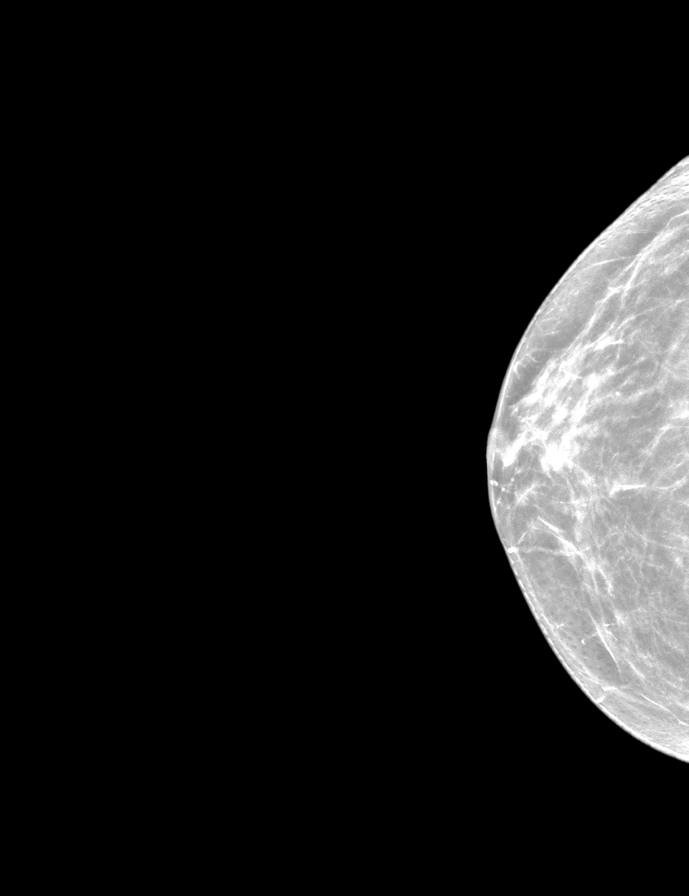

[L CC synth-2D]
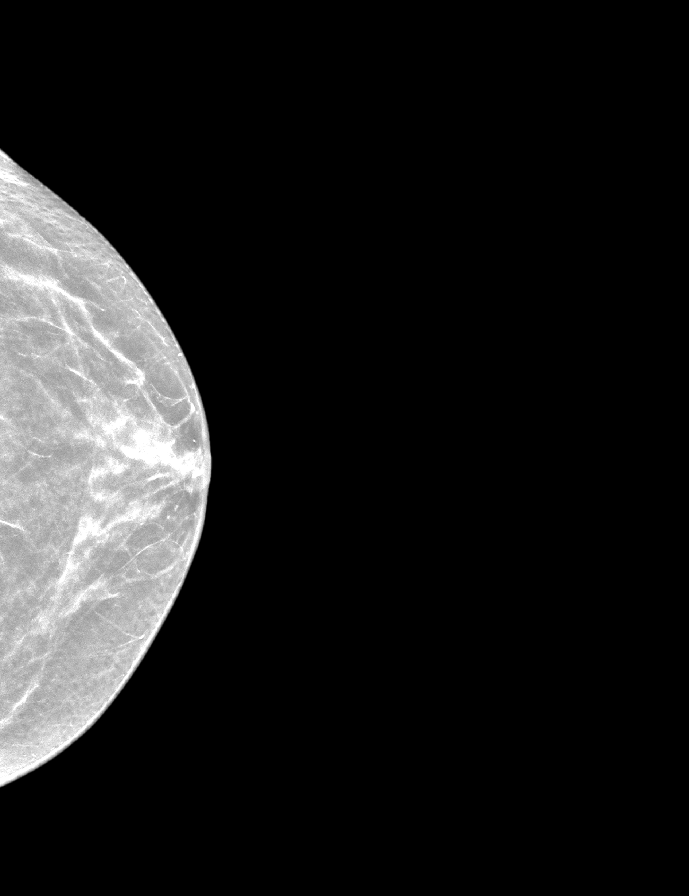

[R CCID BREAST TOMOSYNTHESIS IMAGE tomo · tomo slice 23/46.0]
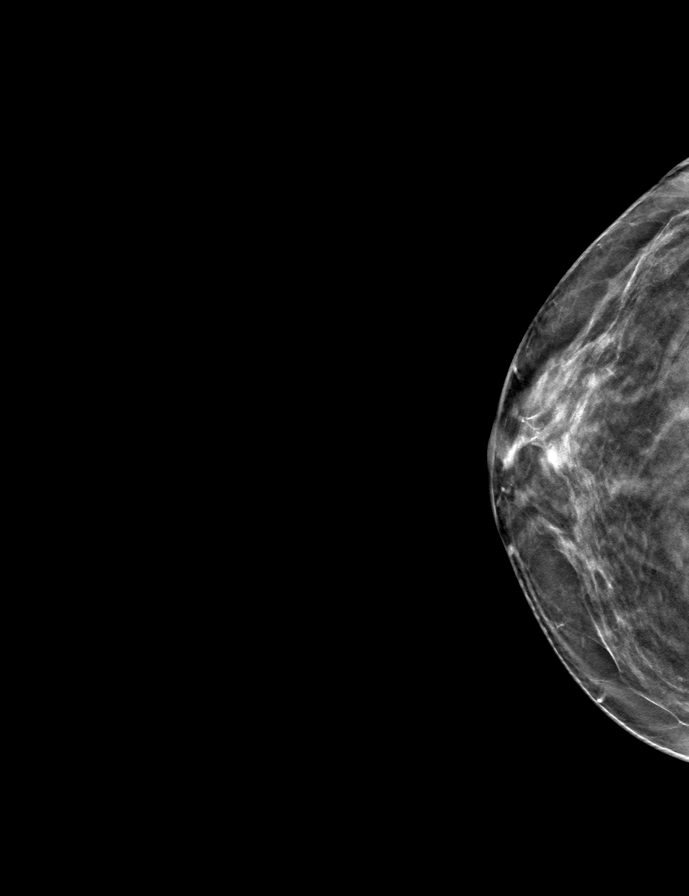

[9 of 28 positions shown; findings below may reference images not displayed]

ACR Breast Density Category b: There are scattered areas of
fibroglandular density.
FINDINGS: There are no findings suspicious for malignancy. Images were
processed with CAD.
IMPRESSION: No mammographic evidence of malignancy. A result letter of this
screening mammogram will be mailed directly to the patient.

RECOMMENDATION:
Screening mammogram in one year. (Code:60-T-8Z4)

BI-RADS CATEGORY  1:  Negative.

## 2021-05-02 ENCOUNTER — Ambulatory Visit: Payer: No Typology Code available for payment source

## 2021-05-03 ENCOUNTER — Ambulatory Visit (INDEPENDENT_AMBULATORY_CARE_PROVIDER_SITE_OTHER): Payer: No Typology Code available for payment source

## 2021-05-03 ENCOUNTER — Other Ambulatory Visit: Payer: Self-pay

## 2021-05-03 DIAGNOSIS — E538 Deficiency of other specified B group vitamins: Secondary | ICD-10-CM

## 2021-05-03 NOTE — Progress Notes (Signed)
Patricia Alvarez is a 60 y.o. female presents to the office today for monthly B12 injections, per physician's orders. Original order: 07/14/2019 cyanocobalamin, 1054mcg , IM was administered left deltoid today. Patient tolerated injection. Patient due for follow up labs/provider appt: Yes. Date due: 07/13/21, appt made Yes Patient next injection due: 1 month, appt made Yes  Macky Galik D Abegail Kloeppel

## 2021-05-07 ENCOUNTER — Ambulatory Visit: Payer: No Typology Code available for payment source | Admitting: Family Medicine

## 2021-05-17 ENCOUNTER — Other Ambulatory Visit: Payer: Self-pay | Admitting: Obstetrics and Gynecology

## 2021-05-17 DIAGNOSIS — Z1231 Encounter for screening mammogram for malignant neoplasm of breast: Secondary | ICD-10-CM

## 2021-05-30 ENCOUNTER — Ambulatory Visit (INDEPENDENT_AMBULATORY_CARE_PROVIDER_SITE_OTHER): Payer: No Typology Code available for payment source

## 2021-05-30 ENCOUNTER — Other Ambulatory Visit: Payer: Self-pay

## 2021-05-30 DIAGNOSIS — E538 Deficiency of other specified B group vitamins: Secondary | ICD-10-CM

## 2021-05-30 NOTE — Progress Notes (Signed)
Patricia Alvarez is a 60 y.o. female presents to the office today for monthly B12 injections, per physician's orders. Original order: 07/14/2019 cyanocobalamin, 1026mg , IM was administered right deltoid today. Patient tolerated injection. Patient due for follow up labs/provider appt: Yes. Date due: 07/13/21, appt made Yes Patient next injection due: 1 month, appt made Yes

## 2021-06-03 NOTE — Patient Instructions (Signed)
Below is our plan:  We will try Nurtec for abortive therapy. Take 1 daily as needed for migraine abortion. May use with rizatriptan if needed but avoid regular use.   Please make sure you are staying well hydrated. I recommend 50-60 ounces daily. Well balanced diet and regular exercise encouraged. Consistent sleep schedule with 6-8 hours recommended.   Please continue follow up with care team as directed.   Follow up with me in 1 year, sooner if needed   You may receive a survey regarding today's visit. I encourage you to leave honest feed back as I do use this information to improve patient care. Thank you for seeing me today!

## 2021-06-03 NOTE — Progress Notes (Signed)
PATIENT: Patricia Alvarez DOB: 1961/04/10  REASON FOR VISIT: follow up HISTORY FROM: patient  Chief Complaint  Patient presents with   Follow-up    Rm 1, alone. Here for yearly migraine f/u, pt states being stable. Pt states she has been taking 2 Maxalt lately.       HISTORY OF PRESENT ILLNESS: 06/04/21 ALL: She returns for follow up for migraines. She continues rizatriptan for abortive therapy. She feels that she is doing fairly well. She reports having about 8 headache days a month, half are migrainous. She feels rizatriptan continues to be helpful for abortive therapy but she usually has to repeat dose. She has more sleepiness with second dose and sometimes has a rebound headache the next day. She denies any other syncopal/unresponsive spells. She feels this was related to stress with previous employment.   05/05/2019 ALL: Patricia Alvarez is a 60 y.o. female here today for follow up for follow-up of migraines.  She continues to have approximately 1-2 migraines per month.  She reports that rizatriptan works if she catches her headaches soon enough.  She has been attempting treatment with Tylenol prior to this prescription.  She reports that sometimes she has to take 2 doses of rizatriptan to abort migraine.  Otherwise she is doing well.  No other concerns today.  HISTORY: (copied from Dr Gladstone Lighter note on 11/08/2018)  UPDATE (11/08/18, VRP): Since last visit, doing well. She reports that she has had a migraine about once or twice a month since being seen in July, 2019. She reports that the migraine is usually located behind her ear (usually right sided but can occur on the left). Pain is described as a pulsating, pressure sensation that radiates up to the top of her head. She will have light sensitivity and occasionally nausea with migraine. She can lie in a dark room with light pressure to her head and headaches improve. She has noted an unusual smell prior to migraine. She is taking Maxalt  with onset of migraine and this usually helps. She feels that she usually has another migraine the following day that is relieved with Maxalt as well. She rarely takes an Excedrin for non migrainous headaches. She denies stroke like symptoms with migraine. She is contributing the improvement to treatment of vitamin B12 and vitamin D deficiency. She has been treated with B12 injections as well as oral supplements. She is also taking vitamin D and magnesium supplements OTC. She reports that her numbness and tingling in her right hand and arm is significantly improved.    PRIOR HPI (05/03/2018, VRP): 60 year old female here for evaluation of headaches.   Patient had onset of headaches sometime in her late 26s.  She describes pounding, unilateral, mainly left-sided, throbbing severe headaches associated with nausea, blurred vision, sensitivity to sound, seeing dots and spots.  Headaches were intermittent for many years.  Patient treated these with over-the-counter medications.   The last couple of years patient has had slight change in her headaches with sharp shooting pain in her left ear, left eye, facial numbness, lip numbness, drooling sensation, arm numbness.  These also were associated with headaches.  Patient feeling headaches and pain behind her ears and behind her head.  Headaches increased in April 2019.  Now patient having headaches 3-4 times per week.  Patient had a CT scan and MRI of the brain, as well as emergency room evaluation, and was diagnosed with probable complicated migraine.  She was prescribed amitriptyline but has not tried  it yet.   Patient has family history of migraine in her mother.  No specific triggering factors currently for her headaches.   Patient has a long history of somewhat interrupted sleep and mild insomnia.  She is able to fall asleep without difficulty but then tends to wake up in the middle the night and has difficulty falling back asleep.    REVIEW OF SYSTEMS: Out  of a complete 14 system review of symptoms, the patient complains only of the following symptoms, headaches and all other reviewed systems are negative.  ALLERGIES: Allergies  Allergen Reactions   Codeine Itching and Nausea Only    REACTION: nausea   Gluten Meal Other (See Comments)    Stomach swells    Morphine Nausea Only    REACTION: nausea   Prednisone     REACTION: Hyperactive    HOME MEDICATIONS: Outpatient Medications Prior to Visit  Medication Sig Dispense Refill   aspirin-acetaminophen-caffeine (EXCEDRIN MIGRAINE) 250-250-65 MG tablet Take by mouth as needed for headache.      estradiol (ESTRACE) 0.5 MG tablet Take 0.5 mg by mouth daily.   3   famotidine (PEPCID) 20 MG tablet Take 1 tablet (20 mg total) by mouth 2 (two) times daily. 60 tablet 1   vitamin B-12 (CYANOCOBALAMIN) 1000 MCG tablet Take 1,000 mcg by mouth daily.     Vitamin D, Ergocalciferol, 2000 units CAPS Take 2,000 Units by mouth daily.     dicyclomine (BENTYL) 10 MG capsule Take 1 capsule (10 mg total) by mouth 3 (three) times daily before meals. 90 capsule 11   rizatriptan (MAXALT-MLT) 10 MG disintegrating tablet Take 1 tablet (10 mg total) by mouth as needed for migraine. May repeat in 2 hours if needed 9 tablet 12   Facility-Administered Medications Prior to Visit  Medication Dose Route Frequency Provider Last Rate Last Admin   cyanocobalamin ((VITAMIN B-12)) injection 1,000 mcg  1,000 mcg Intramuscular Q30 days Kuneff, Renee A, DO   1,000 mcg at 05/30/21 0854    PAST MEDICAL HISTORY: Past Medical History:  Diagnosis Date   Allergic rhinitis    Allergy    B12 deficiency    Back pain    Chicken pox    Colon polyp 2015   TUBULAR ADENOMA (X 1).   DDD (degenerative disc disease), cervical    Endometriosis of uterus    GERD (gastroesophageal reflux disease)    Hyperlipidemia    IBS (irritable bowel syndrome)    Internal hemorrhoid    Migraines    Rectal bleeding    Vitamin D deficiency      PAST SURGICAL HISTORY: Past Surgical History:  Procedure Laterality Date   APPENDECTOMY  02/2000   and ovary   AUGMENTATION MAMMAPLASTY Bilateral 1998   Saline,retropectoral    COLONOSCOPY W/ BIOPSIES  2015   multiple   ESOPHAGOGASTRODUODENOSCOPY  2011   multiple   LAPAROSCOPY     OOPHORECTOMY Left 02/2000   w/appendectomy   PARTIAL HYSTERECTOMY  1994   right ovary and uterus   TOTAL HIP ARTHROPLASTY Right 08/2018   August 31, 2018    FAMILY HISTORY: Family History  Problem Relation Age of Onset   Hyperlipidemia Mother 25   Heart failure Mother 103   Arthritis Mother    Asthma Mother    Hearing loss Mother    Heart disease Mother    Hypertension Mother    Miscarriages / [place] Mother    Colon polyps Father    Stroke Father  Hearing loss Father    Breast cancer Maternal Grandmother 41   Colon cancer Neg Hx    Esophageal cancer Neg Hx    Rectal cancer Neg Hx    Stomach cancer Neg Hx     SOCIAL HISTORY: Social History   Socioeconomic History   Marital status: Married    Spouse name: Not on file   Number of children: 2   Years of education: 12   Highest education level: Not on file  Occupational History   Occupation: Herbalist    Employer: [employer]  Tobacco Use   Smoking status: Never   Smokeless tobacco: Never  Vaping Use   Vaping Use: Never used  Substance and Sexual Activity   Alcohol use: No   Drug use: No   Sexual activity: Yes    Partners: Male    Birth control/protection: Surgical  Other Topics Concern   Not on file  Social History Narrative   Married. 2 children.    HS graduate. Employed as a Herbalist.    Uses bicycle helmet, smoke alarm in the home.   Feels safe in her relationships.   Caffeine- unsweet 1 quart most days.    Social Determinants of Health   Financial Resource Strain: Not on file  Food Insecurity: Not on file  Transportation Needs: Not on file  Physical Activity: Not on file  Stress: Not  on file  Social Connections: Not on file  Intimate Partner Violence: Not on file      PHYSICAL EXAM  Vitals:   06/04/21 0712  BP: 105/71  Pulse: 61  Weight: 144 lb (65.3 kg)  Height: '5\' 5"'$  (1.651 m)    Body mass index is 23.96 kg/m.  Generalized: Well developed, in no acute distress  Cardiology: normal rate and rhythm, no murmur noted Neurological examination  Mentation: Alert oriented to time, place, history taking. Follows all commands speech and language fluent Cranial nerve II-XII: Pupils were equal round reactive to light. Extraocular movements were full, visual field were full on confrontational test. Facial sensation and strength were normal. Head turning and shoulder shrug  were normal and symmetric. Motor: The motor testing reveals 5 over 5 strength of all 4 extremities. Good symmetric motor tone is noted throughout.  Gait and station: Gait is normal.    DIAGNOSTIC DATA (LABS, IMAGING, TESTING) - I reviewed patient records, labs, notes, testing and imaging myself where available.  No flowsheet data found.   Lab Results  Component Value Date   WBC 4.9 12/24/2020   HGB 13.7 12/24/2020   HCT 40.1 12/24/2020   MCV 92.1 12/24/2020   PLT 264.0 12/24/2020      Component Value Date/Time   NA 138 12/24/2020 1408   NA 143 05/25/2019 0000   K 4.1 12/24/2020 1408   CL 102 12/24/2020 1408   CO2 28 12/24/2020 1408   GLUCOSE 93 12/24/2020 1408   BUN 10 12/24/2020 1408   BUN 11 05/25/2019 0000   CREATININE 0.60 12/24/2020 1408   CALCIUM 9.2 12/24/2020 1408   PROT 6.8 12/24/2020 1408   ALBUMIN 4.2 12/24/2020 1408   AST 15 12/24/2020 1408   ALT 7 12/24/2020 1408   ALKPHOS 68 12/24/2020 1408   BILITOT 0.4 12/24/2020 1408   GFRNONAA >60 04/15/2018 1137   GFRAA >60 04/15/2018 1137   Lab Results  Component Value Date   CHOL 218 (H) 07/13/2020   HDL 49.60 07/13/2020   LDLCALC 144 (H) 07/13/2020   LDLDIRECT 168.7 01/06/2008  TRIG 126.0 07/13/2020   CHOLHDL 4  07/13/2020   Lab Results  Component Value Date   HGBA1C 5.7 07/13/2020   Lab Results  Component Value Date   VITAMINB12 701 07/13/2020   Lab Results  Component Value Date   TSH 3.75 12/24/2020     ASSESSMENT AND PLAN 60 y.o. year old female  has a past medical history of Allergic rhinitis, Allergy, B12 deficiency, Back pain, Chicken pox, Colon polyp (2015), DDD (degenerative disc disease), cervical, Endometriosis of uterus, GERD (gastroesophageal reflux disease), Hyperlipidemia, IBS (irritable bowel syndrome), Internal hemorrhoid, Migraines, Rectal bleeding, and Vitamin D deficiency. here with     ICD-10-CM   1. Migraine with aura and without status migrainosus, not intractable  G43.109        Libbey feels that rizatriptan continues to offer abortive therapy, however, she is having to repeat dose leading to more side effects. We will try Nurtec. Potential side effects and appropriate administration discussed. Education material provided in AVS. She was give 2 sample tablets and copay card. She may use rizatriptan in combination with Nurtec for intractable headaches but advised against regular use of any abortive agent. Healthy lifestyle habits advised. Annual follow-up advised, sooner if needed.  She verbalizes understanding and agreement this plan.   No orders of the defined types were placed in this encounter.    Meds ordered this encounter  Medications   Rimegepant Sulfate (NURTEC) 75 MG TBDP    Sig: Take 75 mg by mouth daily as needed (take for abortive therapy of migraine, no more than 1 tablet in 24 hours or 10 per month).    Dispense:  10 tablet    Refill:  11    Order Specific Question:   Supervising Provider    Answer:   Melvenia Beam [id number]   rizatriptan (MAXALT-MLT) 10 MG disintegrating tablet    Sig: Take 1 tablet (10 mg total) by mouth as needed for migraine. May repeat in 2 hours if needed    Dispense:  9 tablet    Refill:  12    Order Specific Question:    Supervising Provider    Answer:   Melvenia Beam [id number]   Rimegepant Sulfate (NURTEC) 75 MG TBDP    Sig: Take 75 mg by mouth daily as needed (take for abortive therapy of migraine, no more than 1 tablet in 24 hours or 10 per month).    Dispense:  2 tablet    Refill:  0    Order Specific Question:   Supervising Provider    Answer:   Melvenia Beam [id number]    Order Specific Question:   Lot Number?    Answer:   [id number]    Order Specific Question:   Expiration Date?    Answer:   06/28/2023       I spent 15 minutes with the patient. 50% of this time was spent counseling and educating patient on plan of care and medications.    Debbora Presto, FNP-C 06/04/2021, 7:45 AM [facility] [address], [place], [place] [address] [phone]

## 2021-06-04 ENCOUNTER — Ambulatory Visit: Payer: No Typology Code available for payment source | Admitting: Family Medicine

## 2021-06-04 ENCOUNTER — Other Ambulatory Visit: Payer: Self-pay

## 2021-06-04 ENCOUNTER — Encounter: Payer: Self-pay | Admitting: Family Medicine

## 2021-06-04 VITALS — BP 105/71 | HR 61 | Ht 65.0 in | Wt 144.0 lb

## 2021-06-04 DIAGNOSIS — G43109 Migraine with aura, not intractable, without status migrainosus: Secondary | ICD-10-CM

## 2021-06-04 MED ORDER — RIZATRIPTAN BENZOATE 10 MG PO TBDP: 10.0000 mg | ORAL_TABLET | ORAL | 12 refills | Status: DC | PRN

## 2021-06-04 MED ORDER — NURTEC 75 MG PO TBDP: 75.0000 mg | ORAL_TABLET | Freq: Every day | ORAL | 0 refills | Status: DC | PRN

## 2021-06-04 MED ORDER — NURTEC 75 MG PO TBDP: 75.0000 mg | ORAL_TABLET | Freq: Every day | ORAL | 11 refills | Status: DC | PRN

## 2021-06-27 ENCOUNTER — Other Ambulatory Visit: Payer: Self-pay

## 2021-06-27 ENCOUNTER — Ambulatory Visit (INDEPENDENT_AMBULATORY_CARE_PROVIDER_SITE_OTHER): Payer: No Typology Code available for payment source

## 2021-06-27 DIAGNOSIS — E538 Deficiency of other specified B group vitamins: Secondary | ICD-10-CM | POA: Diagnosis not present

## 2021-06-27 MED ORDER — CYANOCOBALAMIN 1000 MCG/ML IJ SOLN
1000.0000 ug | Freq: Once | INTRAMUSCULAR | Status: AC
  Administered 2021-06-27: 1000 ug via INTRAMUSCULAR

## 2021-06-27 NOTE — Progress Notes (Signed)
Patricia Alvarez is a 60 y.o. female presents to the office today for monthly B12 injections, per physician's orders. Original order: 07/14/2019 cyanocobalamin, 1063mg , IM was administered left deltoid today. Patient tolerated injection. Patient due for follow up labs/provider appt: Yes. Date due: 08/15/21, appt made Yes Patient next injection due: 1 month, appt made Yes

## 2021-07-03 ENCOUNTER — Encounter: Payer: Self-pay | Admitting: Neurology

## 2021-07-03 ENCOUNTER — Telehealth: Payer: Self-pay | Admitting: Family Medicine

## 2021-07-03 MED ORDER — UBRELVY 100 MG PO TABS: 100.0000 mg | ORAL_TABLET | Freq: Every day | ORAL | 11 refills | Status: DC | PRN

## 2021-07-03 NOTE — Telephone Encounter (Signed)
Absolutely. I will call in Ubrelvy '100mg'$  as needed. She should take 1 tablet at onset of headache. May take 1 additional tablet in 2 hours if needed. Do not take more than 2 tablets in 24 hours or more than 10 in a month. She can pair this with a Tylenol and NSAID like ibuprofen or Aleve if she needs to for a really bad headache. Have her let us know if it isn't working.

## 2021-07-03 NOTE — Telephone Encounter (Signed)
Called the patient back to discuss. States that she has had migraine and went to take nurtec twice and didn't find it effective. She states didn't touch it. She had to follow it up with maxalt but the maxalt made it difficult to function. She was asking if she may be a candidate for [place]. Advised I would discuss with Amy and see what her thoughts are. If Amy agrees with that plan, informed the pt I will send her a message on mychart. Pt verbalized understanding. Pt had no questions at this time but was encouraged to call back if questions arise.

## 2021-07-03 NOTE — Telephone Encounter (Signed)
Pt called wanting to speak to the RN regarding her Rimegepant Sulfate (NURTEC) 75 MG TBDP. Pt states it is not working for her. Please advise.

## 2021-07-10 ENCOUNTER — Other Ambulatory Visit: Payer: Self-pay

## 2021-07-10 ENCOUNTER — Ambulatory Visit
Admission: RE | Admit: 2021-07-10 | Discharge: 2021-07-10 | Disposition: A | Discharge status: Home / Self Care | Payer: No Typology Code available for payment source | Source: Ambulatory Visit | Attending: Obstetrics and Gynecology | Admitting: Obstetrics and Gynecology

## 2021-07-10 DIAGNOSIS — Z1231 Encounter for screening mammogram for malignant neoplasm of breast: Secondary | ICD-10-CM

## 2021-07-19 ENCOUNTER — Encounter: Payer: Self-pay | Admitting: Gastroenterology

## 2021-07-19 ENCOUNTER — Ambulatory Visit: Payer: No Typology Code available for payment source | Admitting: Gastroenterology

## 2021-07-19 VITALS — BP 98/60 | HR 64 | Ht 64.0 in | Wt 140.6 lb

## 2021-07-19 DIAGNOSIS — K219 Gastro-esophageal reflux disease without esophagitis: Secondary | ICD-10-CM

## 2021-07-19 DIAGNOSIS — R079 Chest pain, unspecified: Secondary | ICD-10-CM | POA: Diagnosis not present

## 2021-07-19 MED ORDER — OMEPRAZOLE 20 MG PO CPDR: 20.0000 mg | DELAYED_RELEASE_CAPSULE | Freq: Two times a day (BID) | ORAL | 11 refills | Status: DC

## 2021-07-19 MED ORDER — FAMOTIDINE 40 MG PO TABS: 40.0000 mg | ORAL_TABLET | Freq: Every day | ORAL | 11 refills | Status: DC

## 2021-07-19 NOTE — Progress Notes (Signed)
    History of Present Illness: This is a 60 year old female with worsening reflux symptoms.  She describes frequent nocturnal symptoms waking her 2-3 times a night she has intermittent daytime symptoms.  She has a chest pain that radiates in between her shoulder blades intermittently which is sometimes associated with reflux symptoms and has occurred at other times.  She notes intermittent epigastric pain as well.  She increased omeprazole to twice a day with improved control of her daytime symptoms but not her nocturnal symptoms.  She closely follows antireflux measures.  She denies nausea, vomiting, weight loss, change in bowel habits.  EGD in Jan 2011 was normal. CT AP in March 2022 was unremarkable,   Current Medications, Allergies, Past Medical History, Past Surgical History, Family History and Social History were reviewed in [employer] record.   Physical Exam: General: Well developed, well nourished, no acute distress Head: Normocephalic and atraumatic Eyes: Sclerae anicteric, EOMI Ears: Normal auditory acuity Mouth: Not examined, mask on during Covid-19 pandemic Lungs: Clear throughout to auscultation Heart: Regular rate and rhythm; no murmurs, rubs or bruits Abdomen: Soft, mild epigastric tenderness and non distended. No masses, hepatosplenomegaly or hernias noted. Normal Bowel sounds Rectal: Not done Musculoskeletal: Symmetrical with no gross deformities  Pulses:  Normal pulses noted Extremities: No clubbing, cyanosis, edema or deformities noted Neurological: Alert oriented x 4, grossly nonfocal Psychological:  Alert and cooperative. Normal mood and affect   Assessment and Recommendations:  GERD with intermittent chest pain and intermittent back pain between her shoulder blades.  Rule out esophagitis, Barrett's and other disorders.  Closely follow antireflux measures.  Continue omeprazole 20 mg p.o. twice daily however change timing to 30 minutes before  breakfast and 30 minutes before dinner.  Begin famotidine 40 mg at bedtime.  Schedule EGD. The risks (including bleeding, perforation, infection, missed lesions, medication reactions and possible hospitalization or surgery if complications occur), benefits, and alternatives to endoscopy with possible biopsy and possible dilation were discussed with the patient and they consent to proceed.

## 2021-07-19 NOTE — Patient Instructions (Signed)
We have sent the following medications to your pharmacy for you to pick up at your convenience: omeprazole 40 mg one tablet by mouth twice daily and famotidine 40 mg one tablet by mouth at bedtime.   Patient advised to avoid spicy, acidic, citrus, chocolate, mints, fruit and fruit juices.  Limit the intake of caffeine, alcohol and Soda.  Don't exercise too soon after eating.  Don't lie down within 3-4 hours of eating.  Elevate the head of your bed.  You have been scheduled for an endoscopy. Please follow written instructions given to you at your visit today. If you use inhalers (even only as needed), please bring them with you on the day of your procedure.  Due to recent changes in healthcare laws, you may see the results of your imaging and laboratory studies on MyChart before your provider has had a chance to review them.  We understand that in some cases there may be results that are confusing or concerning to you. Not all laboratory results come back in the same time frame and the provider may be waiting for multiple results in order to interpret others.  Please give [place] 48 hours in order for your provider to thoroughly review all the results before contacting the office for clarification of your results.   The [facility] providers would like to encourage you to use [facility] to communicate with providers for non-urgent requests or questions.  Due to long hold times on the telephone, sending your provider a message by [facility] may be a faster and more efficient way to get a response.  Please allow 48 business hours for a response.  Please remember that this is for non-urgent requests.   Normal BMI (Body Mass Index- based on height and weight) is between 19 and 25. Your BMI today is Body mass index is 24.13 kg/m. Marland Kitchen Please consider follow up  regarding your BMI with your Primary Care Provider.  Thank you for choosing me and [facility].  Pricilla Riffle. Dagoberto Ligas., MD., [facility] Regal

## 2021-07-26 ENCOUNTER — Encounter: Payer: Self-pay | Admitting: Gastroenterology

## 2021-08-02 ENCOUNTER — Encounter: Payer: No Typology Code available for payment source | Admitting: Gastroenterology

## 2021-08-13 DIAGNOSIS — M8580 Other specified disorders of bone density and structure, unspecified site: Secondary | ICD-10-CM | POA: Insufficient documentation

## 2021-08-15 ENCOUNTER — Ambulatory Visit (INDEPENDENT_AMBULATORY_CARE_PROVIDER_SITE_OTHER): Payer: No Typology Code available for payment source | Admitting: Family Medicine

## 2021-08-15 ENCOUNTER — Other Ambulatory Visit: Payer: Self-pay

## 2021-08-15 ENCOUNTER — Encounter: Payer: Self-pay | Admitting: Family Medicine

## 2021-08-15 VITALS — BP 95/63 | HR 70 | Temp 98.5°F | Ht 64.25 in | Wt 141.0 lb

## 2021-08-15 DIAGNOSIS — Z0000 Encounter for general adult medical examination without abnormal findings: Secondary | ICD-10-CM | POA: Diagnosis not present

## 2021-08-15 DIAGNOSIS — E782 Mixed hyperlipidemia: Secondary | ICD-10-CM | POA: Diagnosis not present

## 2021-08-15 DIAGNOSIS — E559 Vitamin D deficiency, unspecified: Secondary | ICD-10-CM

## 2021-08-15 DIAGNOSIS — E538 Deficiency of other specified B group vitamins: Secondary | ICD-10-CM | POA: Diagnosis not present

## 2021-08-15 DIAGNOSIS — Z131 Encounter for screening for diabetes mellitus: Secondary | ICD-10-CM

## 2021-08-15 LAB — VITAMIN D 25 HYDROXY (VIT D DEFICIENCY, FRACTURES): VITD: 53.01 ng/mL (ref 30.00–100.00)

## 2021-08-15 LAB — CBC
HCT: 40.2 % (ref 36.0–46.0)
Hemoglobin: 13.4 g/dL (ref 12.0–15.0)
MCHC: 33.5 g/dL (ref 30.0–36.0)
MCV: 97 fl (ref 78.0–100.0)
Platelets: 253 10*3/uL (ref 150.0–400.0)
RBC: 4.14 Mil/uL (ref 3.87–5.11)
RDW: 14.4 % (ref 11.5–15.5)
WBC: 4 10*3/uL (ref 4.0–10.5)

## 2021-08-15 LAB — LIPID PANEL
Cholesterol: 217 mg/dL — ABNORMAL HIGH (ref 0–200)
HDL: 64.5 mg/dL (ref >39.00)
LDL Cholesterol: 134 mg/dL — ABNORMAL HIGH (ref 0–99)
NonHDL: 152.73
Total CHOL/HDL Ratio: 3
Triglycerides: 93 mg/dL (ref 0.0–149.0)
VLDL: 18.6 mg/dL (ref 0.0–40.0)

## 2021-08-15 LAB — COMPREHENSIVE METABOLIC PANEL
ALT: 10 U/L (ref 0–35)
AST: 20 U/L (ref 0–37)
Albumin: 4 g/dL (ref 3.5–5.2)
Alkaline Phosphatase: 57 U/L (ref 39–117)
BUN: 11 mg/dL (ref 6–23)
CO2: 30 mEq/L (ref 19–32)
Calcium: 9 mg/dL (ref 8.4–10.5)
Chloride: 104 mEq/L (ref 96–112)
Creatinine, Ser: 0.63 mg/dL (ref 0.40–1.20)
GFR: 97.43 mL/min (ref >60.00)
Glucose, Bld: 83 mg/dL (ref 70–99)
Potassium: 4.2 mEq/L (ref 3.5–5.1)
Sodium: 140 mEq/L (ref 135–145)
Total Bilirubin: 0.5 mg/dL (ref 0.2–1.2)
Total Protein: 6.1 g/dL (ref 6.0–8.3)

## 2021-08-15 LAB — HEMOGLOBIN A1C: Hgb A1c MFr Bld: 5.5 % (ref 4.6–6.5)

## 2021-08-15 LAB — VITAMIN B12: Vitamin B-12: 1040 pg/mL — ABNORMAL HIGH (ref 211–911)

## 2021-08-15 LAB — TSH: TSH: 3.32 u[IU]/mL (ref 0.35–5.50)

## 2021-08-15 MED ORDER — CYANOCOBALAMIN 1000 MCG/ML IJ SOLN
1000.0000 ug | Freq: Once | INTRAMUSCULAR | Status: AC
  Administered 2021-08-15: 1000 ug via INTRAMUSCULAR

## 2021-08-15 MED ORDER — HYDROXYZINE HCL 10 MG PO TABS: 5.0000 mg | ORAL_TABLET | Freq: Every day | ORAL | 1 refills | Status: DC

## 2021-08-15 NOTE — Progress Notes (Signed)
This visit occurred during the SARS-CoV-2 public health emergency.  Safety protocols were in place, including screening questions prior to the visit, additional usage of staff PPE, and extensive cleaning of exam room while observing appropriate contact time as indicated for disinfecting solutions.    Patient ID: Patricia Alvarez, female  DOB: [birth year], 60 y.o.   MRN: [id number] Patient Care Team    Relationship Specialty Notifications Start End  Ma Hillock, DO PCP - General Family Medicine  02/17/18   Ladene Artist, MD Consulting Physician Gastroenterology  02/17/18   Delsa Sale, OD  Optometry  02/17/18   Molli Posey, MD Consulting Physician Obstetrics and Gynecology  02/17/18     Chief Complaint  Patient presents with   Annual Exam    Pt is fasting    Subjective:  Patricia Alvarez is a 60 y.o.  Female  present for [place] All past medical history, surgical history, allergies, family history, immunizations, medications and social history were updated in the electronic medical record today. All recent labs, ED visits and hospitalizations within the last year were reviewed.   B12/Vit d def: pt is continue her q 4 week injections of b12 in the office. This is a standing order for her as long as she follows every 12 months with cpe and recheck on b12. She feels the B12 has worked great for her.   Health maintenance:  Colonoscopy: completed 10/2018, by Dr. Fuller Plan. follow up 5 yr. Mammogram: completed:06/19/2020, birads Dr.Holland.  Cervical cancer screening: last pap: 2020,, completed by: Dr. Matthew Saras 3-47yr Immunizations: tdap UTD 2015, Influenza declined today (encouraged yearly), shingrix completed, COVID series completed Infectious disease screening: HIV and Hep C completed DEXA: completed at GYN- records requested again (completed 2021) Assistive device: none Oxygen ACZ:YSAY Patient has a Dental home. Hospitalizations/ED visits: reviewed   Depression screen [facility] 2/9  08/15/2021 07/13/2020 07/13/2019 12/24/2018 07/28/2018  Decreased Interest 0 0 0 0 0  Down, Depressed, Hopeless 0 0 0 0 0  PHQ - 2 Score 0 0 0 0 0   No flowsheet data found.  Immunization History  Administered Date(s) Administered   Influenza,inj,Quad PF,6+ Mos 07/28/2018, 07/13/2019, 07/13/2020   PFIZER(Purple Top)SARS-COV-2 Vaccination 01/11/2020, 02/08/2020   Td 06/14/2001   Tdap 04/27/2014   Zoster Recombinat (Shingrix) 08/09/2020, 01/30/2021   Past Medical History:  Diagnosis Date   Allergic rhinitis    Allergy    B12 deficiency    Back pain    Chicken pox    Colon polyp 2015   TUBULAR ADENOMA (X 1).   DDD (degenerative disc disease), cervical    Endometriosis of uterus    GERD (gastroesophageal reflux disease)    Hyperlipidemia    IBS (irritable bowel syndrome)    Internal hemorrhoid    Migraines    Rectal bleeding    Vitamin D deficiency    Allergies  Allergen Reactions   Codeine Itching and Nausea Only    REACTION: nausea   Gluten Meal Other (See Comments)    Stomach swells    Morphine Nausea Only    REACTION: nausea   Oxycodone-Acetaminophen Nausea And Vomiting   Prednisone     REACTION: Hyperactive   Sulfa Antibiotics Nausea Only   Past Surgical History:  Procedure Laterality Date   ABDOMINAL HYSTERECTOMY  1994   APPENDECTOMY  02/2000   and ovary   AUGMENTATION MAMMAPLASTY Bilateral 1998   Saline,retropectoral    COLONOSCOPY W/ BIOPSIES  2015   multiple   COSMETIC  SURGERY  Tummy tuck   ESOPHAGOGASTRODUODENOSCOPY  2011   multiple   JOINT REPLACEMENT  2019   LAPAROSCOPY     OOPHORECTOMY Left 02/2000   w/appendectomy   PARTIAL HYSTERECTOMY  1994   right ovary and uterus   TOTAL HIP ARTHROPLASTY Right 08/2018   August 31, 2018   TUBAL LIGATION  1990   Family History  Problem Relation Age of Onset   Hyperlipidemia Mother 38   Heart failure Mother 64   Arthritis Mother    Asthma Mother    Hearing loss Mother    Heart disease Mother     Hypertension Mother    Miscarriages / [place] Mother    Colon polyps Father    Stroke Father    Hearing loss Father    Breast cancer Maternal Grandmother 71   Colon cancer Neg Hx    Esophageal cancer Neg Hx    Rectal cancer Neg Hx    Stomach cancer Neg Hx    Social History   Social History Narrative   Married. 2 children.    HS graduate. Employed as a Herbalist.    Uses bicycle helmet, smoke alarm in the home.   Feels safe in her relationships.   Caffeine- unsweet 1 quart most days.     Allergies as of 08/15/2021       Reactions   Codeine Itching, Nausea Only   REACTION: nausea   Gluten Meal Other (See Comments)   Stomach swells    Morphine Nausea Only   REACTION: nausea   Oxycodone-acetaminophen Nausea And Vomiting   Prednisone    REACTION: Hyperactive   Sulfa Antibiotics Nausea Only        Medication List        Accurate as of August 15, 2021  4:21 PM. If you have any questions, ask your nurse or doctor.          STOP taking these medications    tobramycin 0.3 % ophthalmic solution Commonly known as: TOBREX Stopped by: Howard Pouch, DO       TAKE these medications    aspirin-acetaminophen-caffeine 250-250-65 MG tablet Commonly known as: EXCEDRIN MIGRAINE Take by mouth as needed for headache.   estradiol 0.5 MG tablet Commonly known as: ESTRACE Take 0.5 mg by mouth daily.   famotidine 40 MG tablet Commonly known as: PEPCID Take 1 tablet (40 mg total) by mouth at bedtime. What changed:  when to take this reasons to take this   hydrOXYzine 10 MG tablet Commonly known as: ATARAX/VISTARIL Take 0.5-3 tablets (5-30 mg total) by mouth at bedtime. Started by: Howard Pouch, DO   omeprazole 20 MG capsule Commonly known as: PRILOSEC Take 1 capsule (20 mg total) by mouth 2 (two) times daily before a meal. What changed: when to take this   rizatriptan 10 MG disintegrating tablet Commonly known as: MAXALT-MLT Take 1 tablet (10 mg  total) by mouth as needed for migraine. May repeat in 2 hours if needed   Ubrelvy 100 MG Tabs Generic drug: Ubrogepant Take 100 mg by mouth daily as needed. Take one tablet at onset of headache, may repeat 1 tablet in 2 hours, no more than 2 tablets in 24 hours   vitamin B-12 1000 MCG tablet Commonly known as: CYANOCOBALAMIN Take 1,000 mcg by mouth daily.   Vitamin D (Ergocalciferol) 50 MCG (2000 UT) Caps Take 2,000 Units by mouth daily.        All past medical history, surgical history, allergies, family history,  immunizations andmedications were updated in the EMR today and reviewed under the history and medication portions of their EMR.     No results found for this or any previous visit (from the past 2160 hour(s)).  MM 3D SCREEN BREAST W/IMPLANT BILATERAL Result Date: 07/15/2021  IMPRESSION: No mammographic evidence of malignancy. A result letter of this screening mammogram will be mailed directly to the patient. RECOMMENDATION: Screening mammogram in one year. (Code:SM-B-01Y) BI-RADS CATEGORY  1:  Negative. Electronically Signed   By: Dorise Bullion III M.D.   On: 07/15/2021 13:45   ROS: 14 pt review of systems performed and negative (unless mentioned in an HPI)  Objective: BP 95/63   Pulse 70   Temp 98.5 F (36.9 C) (Oral)   Ht 5' 4.25" (1.632 m)   Wt 141 lb (64 kg)   SpO2 97%   BMI 24.01 kg/m  Gen: Afebrile. No acute distress. Nontoxic in appearance, well-developed, well-nourished, very pleasant, female HENT: AT. [place]. Bilateral TM visualized and normal in appearance, normal external auditory canal. MMM, no oral lesions, adequate dentition. Bilateral nares within normal limits. Throat without erythema, ulcerations or exudates.  No cough on exam, no hoarseness on exam. Eyes:Pupils Equal Round Reactive to light, Extraocular movements intact,  Conjunctiva without redness, discharge or icterus. Neck/lymp/endocrine: Supple, no lymphadenopathy, no thyromegaly CV: RRR no  murmur, no edema, +2/4 P posterior tibialis pulses.  Chest: CTAB, no wheeze, rhonchi or crackles.  Normal respiratory effort.  Good air movement. Abd: Soft.  Flat. NTND. BS present.  No masses palpated. No hepatosplenomegaly. No rebound tenderness or guarding. Skin: no rashes, purpura or petechiae. Warm and well-perfused. Skin intact. Neuro/Msk: Normal gait. PERLA. EOMi. Alert. Oriented x3.  Cranial nerves II through XII intact. Muscle strength 5/5 upper/lower extremity. DTRs equal bilaterally. Psych: Normal affect, dress and demeanor. Normal speech. Normal thought content and judgment.   No results found.  Assessment/plan: Patricia Alvarez is a 60 y.o. female present for CPE/cmc  B12 deficiency Standing order for b12 q 4 weeks by nurse visit.  - cyanocobalamin ((VITAMIN B-12)) injection 1,000 mcg provided today - B12 levels collected today  Vitamin D deficiency - VITAMIN D 25 Hydroxy (Vit-D Deficiency, Fractures)  Mixed hyperlipidemia Diet and exercise recommended. - CBC - Comprehensive metabolic panel - Lipid panel - TSH  Diabetes mellitus screening - Hemoglobin A1c Encounter for preventative adult health care examination Colonoscopy: completed 10/2018, by Dr. Fuller Plan. follow up 5 yr. Mammogram: completed:06/19/2020, birads Dr.Holland.  Cervical cancer screening: last pap: 2020,, completed by: Dr. Matthew Saras 3-53yr Immunizations: tdap UTD 2015, Influenza declined today (encouraged yearly), shingrix completed, COVID series completed Infectious disease screening: HIV and Hep C completed DEXA: completed at GYN- records requested again (completed 2021) Patient was encouraged to exercise greater than 150 minutes a week. Patient was encouraged to choose a diet filled with fresh fruits and vegetables, and lean meats. AVS provided to patient today for education/recommendation on gender specific health and safety maintenance.  Return in about 1 year (around 08/18/2022) for CPE (30  min).  Orders Placed This Encounter  Procedures   CBC   Comprehensive metabolic panel   Lipid panel   Hemoglobin A1c   TSH   VITAMIN D 25 Hydroxy (Vit-D Deficiency, Fractures)   Vitamin B12    Orders Placed This Encounter  Procedures   CBC   Comprehensive metabolic panel   Lipid panel   Hemoglobin A1c   TSH   VITAMIN D 25 Hydroxy (Vit-D Deficiency, Fractures)   Vitamin  B12   Meds ordered this encounter  Medications   hydrOXYzine (ATARAX/VISTARIL) 10 MG tablet    Sig: Take 0.5-3 tablets (5-30 mg total) by mouth at bedtime.    Dispense:  90 tablet    Refill:  1   cyanocobalamin ((VITAMIN B-12)) injection 1,000 mcg   Referral Orders  No referral(s) requested today     Electronically signed by: Howard Pouch, DO [facility]

## 2021-08-15 NOTE — Patient Instructions (Signed)
Great to see you today.  I have refilled the medication(s) we provide.   If labs were collected, we will inform you of lab results once received either by echart message or telephone call.   - echart message- for normal results that have been seen by the patient already.   - telephone call: abnormal results or if patient has not viewed results in their echart. Health Maintenance, Female Adopting a healthy lifestyle and getting preventive care are important in promoting health and wellness. Ask your health care provider about: The right schedule for you to have regular tests and exams. Things you can do on your own to prevent diseases and keep yourself healthy. What should I know about diet, weight, and exercise? Eat a healthy diet  Eat a diet that includes plenty of vegetables, fruits, low-fat dairy products, and lean protein. Do not eat a lot of foods that are high in solid fats, added sugars, or sodium. Maintain a healthy weight Body mass index (BMI) is used to identify weight problems. It estimates body fat based on height and weight. Your health care provider can help determine your BMI and help you achieve or maintain a healthy weight. Get regular exercise Get regular exercise. This is one of the most important things you can do for your health. Most adults should: Exercise for at least 150 minutes each week. The exercise should increase your heart rate and make you sweat (moderate-intensity exercise). Do strengthening exercises at least twice a week. This is in addition to the moderate-intensity exercise. Spend less time sitting. Even light physical activity can be beneficial. Watch cholesterol and blood lipids Have your blood tested for lipids and cholesterol at 59 years of age, then have this test every 5 years. Have your cholesterol levels checked more often if: Your lipid or cholesterol levels are high. You are older than 59 years of age. You are at high risk for heart  disease. What should I know about cancer screening? Depending on your health history and family history, you may need to have cancer screening at various ages. This may include screening for: Breast cancer. Cervical cancer. Colorectal cancer. Skin cancer. Lung cancer. What should I know about heart disease, diabetes, and high blood pressure? Blood pressure and heart disease High blood pressure causes heart disease and increases the risk of stroke. This is more likely to develop in people who have high blood pressure readings, are of African descent, or are overweight. Have your blood pressure checked: Every 3-5 years if you are 18-39 years of age. Every year if you are 40 years old or older. Diabetes Have regular diabetes screenings. This checks your fasting blood sugar level. Have the screening done: Once every three years after age 40 if you are at a normal weight and have a low risk for diabetes. More often and at a younger age if you are overweight or have a high risk for diabetes. What should I know about preventing infection? Hepatitis B If you have a higher risk for hepatitis B, you should be screened for this virus. Talk with your health care provider to find out if you are at risk for hepatitis B infection. Hepatitis C Testing is recommended for: Everyone born from 1945 through 1965. Anyone with known risk factors for hepatitis C. Sexually transmitted infections (STIs) Get screened for STIs, including gonorrhea and chlamydia, if: You are sexually active and are younger than 59 years of age. You are older than 59 years of age and your   health care provider tells you that you are at risk for this type of infection. Your sexual activity has changed since you were last screened, and you are at increased risk for chlamydia or gonorrhea. Ask your health care provider if you are at risk. Ask your health care provider about whether you are at high risk for HIV. Your health care provider  may recommend a prescription medicine to help prevent HIV infection. If you choose to take medicine to prevent HIV, you should first get tested for HIV. You should then be tested every 3 months for as long as you are taking the medicine. Pregnancy If you are about to stop having your period (premenopausal) and you may become pregnant, seek counseling before you get pregnant. Take 400 to 800 micrograms (mcg) of folic acid every day if you become pregnant. Ask for birth control (contraception) if you want to prevent pregnancy. Osteoporosis and menopause Osteoporosis is a disease in which the bones lose minerals and strength with aging. This can result in bone fractures. If you are 65 years old or older, or if you are at risk for osteoporosis and fractures, ask your health care provider if you should: Be screened for bone loss. Take a calcium or vitamin D supplement to lower your risk of fractures. Be given hormone replacement therapy (HRT) to treat symptoms of menopause. Follow these instructions at home: Lifestyle Do not use any products that contain nicotine or tobacco, such as cigarettes, e-cigarettes, and chewing tobacco. If you need help quitting, ask your health care provider. Do not use street drugs. Do not share needles. Ask your health care provider for help if you need support or information about quitting drugs. Alcohol use Do not drink alcohol if: Your health care provider tells you not to drink. You are pregnant, may be pregnant, or are planning to become pregnant. If you drink alcohol: Limit how much you use to 0-1 drink a day. Limit intake if you are breastfeeding. Be aware of how much alcohol is in your drink. In the [place] one drink equals one 12 oz bottle of beer (355 mL), one 5 oz glass of wine (148 mL), or one 1 oz glass of hard liquor (44 mL). General instructions Schedule regular health, dental, and eye exams. Stay current with your vaccines. Tell your health care  provider if: You often feel depressed. You have ever been abused or do not feel safe at home. Summary Adopting a healthy lifestyle and getting preventive care are important in promoting health and wellness. Follow your health care provider's instructions about healthy diet, exercising, and getting tested or screened for diseases. Follow your health care provider's instructions on monitoring your cholesterol and blood pressure. This information is not intended to replace advice given to you by your health care provider. Make sure you discuss any questions you have with your health care provider. Document Revised: 12/21/2020 Document Reviewed: 10/06/2018 Elsevier Patient Education  2022 [employer] Inc[employer]  

## 2021-09-06 ENCOUNTER — Other Ambulatory Visit: Payer: Self-pay | Admitting: Family Medicine

## 2021-09-18 ENCOUNTER — Other Ambulatory Visit: Payer: Self-pay

## 2021-09-18 ENCOUNTER — Ambulatory Visit (INDEPENDENT_AMBULATORY_CARE_PROVIDER_SITE_OTHER): Payer: No Typology Code available for payment source

## 2021-09-18 DIAGNOSIS — E538 Deficiency of other specified B group vitamins: Secondary | ICD-10-CM | POA: Diagnosis not present

## 2021-09-18 MED ORDER — CYANOCOBALAMIN 1000 MCG/ML IJ SOLN
1000.0000 ug | Freq: Once | INTRAMUSCULAR | Status: AC
  Administered 2021-09-18: 1000 ug via INTRAMUSCULAR

## 2021-09-18 NOTE — Progress Notes (Addendum)
Patricia Alvarez is a 60 y.o. female presents to the office today for monthly B12 injections, per physician's orders. Original order: 07/14/2019 cyanocobalamin, 1037mcg , IM was administered R deltoid today. Patient tolerated injection. Patient due for follow up labs/provider appt: No.  Patient next injection due: 1 month, appt made Yes

## 2021-10-05 ENCOUNTER — Other Ambulatory Visit: Payer: Self-pay | Admitting: Family Medicine

## 2021-10-11 ENCOUNTER — Telehealth: Payer: Self-pay | Admitting: Gastroenterology

## 2021-10-11 NOTE — Telephone Encounter (Signed)
Patient c/o two week history of diarrhea.  She reports she has been taking magnesium for leg cramps and about 2 weeks ago increased the dose to 500 mg.  She also reports nausea since increasing the magnesium.  She will decrease her does to 250 mg and if the nausea and diarrhea do not improve she will call back for additional recommendations.

## 2021-10-11 NOTE — Telephone Encounter (Signed)
Inbound call from patient states she have had diarrhea for 2 weeks and nausea for over 1 week.

## 2021-10-16 ENCOUNTER — Ambulatory Visit (INDEPENDENT_AMBULATORY_CARE_PROVIDER_SITE_OTHER): Payer: No Typology Code available for payment source

## 2021-10-16 ENCOUNTER — Other Ambulatory Visit: Payer: Self-pay

## 2021-10-16 DIAGNOSIS — E538 Deficiency of other specified B group vitamins: Secondary | ICD-10-CM | POA: Diagnosis not present

## 2021-10-16 MED ORDER — CYANOCOBALAMIN 1000 MCG/ML IJ SOLN
1000.0000 ug | Freq: Once | INTRAMUSCULAR | Status: AC
  Administered 2021-10-16: 10:00:00 1000 ug via INTRAMUSCULAR

## 2021-10-16 NOTE — Progress Notes (Signed)
Patricia Alvarez is a 60 y.o. female presents to the office today for monthly B12 injections, per physician's orders. Original order: 07/14/2019 cyanocobalamin, 1067mcg , IM was administered Left deltoid today. Patient tolerated injection. Patient due for follow up labs/provider appt: No.  Patient next injection due: 1 month, appt made No

## 2021-11-18 ENCOUNTER — Ambulatory Visit (INDEPENDENT_AMBULATORY_CARE_PROVIDER_SITE_OTHER): Payer: No Typology Code available for payment source

## 2021-11-18 ENCOUNTER — Other Ambulatory Visit: Payer: Self-pay

## 2021-11-18 DIAGNOSIS — E538 Deficiency of other specified B group vitamins: Secondary | ICD-10-CM

## 2021-11-18 MED ORDER — CYANOCOBALAMIN 1000 MCG/ML IJ SOLN
1000.0000 ug | Freq: Once | INTRAMUSCULAR | Status: AC
  Administered 2021-11-18: 1000 ug via INTRAMUSCULAR

## 2021-11-18 NOTE — Progress Notes (Signed)
Patricia Alvarez is a 61 y.o. female presents to the office today for monthly B12 injections, per physician's orders. Original order: 07/14/2019 cyanocobalamin, 1071mcg , IM was administered right deltoid today. Patient tolerated injection. Patient due for follow up labs/provider appt: No.  Patient next injection due: 1 month, appt made No

## 2021-12-16 ENCOUNTER — Other Ambulatory Visit: Payer: Self-pay

## 2021-12-16 ENCOUNTER — Ambulatory Visit (INDEPENDENT_AMBULATORY_CARE_PROVIDER_SITE_OTHER): Payer: No Typology Code available for payment source

## 2021-12-16 DIAGNOSIS — E538 Deficiency of other specified B group vitamins: Secondary | ICD-10-CM

## 2021-12-16 MED ORDER — CYANOCOBALAMIN 1000 MCG/ML IJ SOLN
1000.0000 ug | Freq: Once | INTRAMUSCULAR | Status: AC
  Administered 2021-12-16: 1000 ug via INTRAMUSCULAR

## 2021-12-16 NOTE — Progress Notes (Signed)
Patricia Alvarez is a 61 y.o. female presents to the office today for monthly B12 injections, per physician's orders. Original order: 07/14/2019 cyanocobalamin, 1000mcg , IM was administered right deltoid today. Patient tolerated injection. Patient due for follow up labs/provider appt: No.  Patient next injection due: 1 month, appt made yes 

## 2022-01-07 ENCOUNTER — Ambulatory Visit: Payer: No Typology Code available for payment source

## 2022-01-09 ENCOUNTER — Ambulatory Visit: Payer: No Typology Code available for payment source

## 2022-01-13 ENCOUNTER — Ambulatory Visit (INDEPENDENT_AMBULATORY_CARE_PROVIDER_SITE_OTHER): Payer: No Typology Code available for payment source

## 2022-01-13 ENCOUNTER — Other Ambulatory Visit: Payer: Self-pay

## 2022-01-13 DIAGNOSIS — E538 Deficiency of other specified B group vitamins: Secondary | ICD-10-CM | POA: Diagnosis not present

## 2022-01-13 MED ORDER — CYANOCOBALAMIN 1000 MCG/ML IJ SOLN
1000.0000 ug | Freq: Once | INTRAMUSCULAR | Status: AC
  Administered 2022-01-13: 1000 ug via INTRAMUSCULAR

## 2022-01-13 NOTE — Progress Notes (Signed)
Patricia Alvarez is a 61 y.o. female presents to the office today for monthly B12 injections, per physician's orders. Original order: 07/14/2019 cyanocobalamin, 1000mcg , IM was administered right deltoid today. Patient tolerated injection. Patient due for follow up labs/provider appt: No.  Patient next injection due: 1 month, appt made yes 

## 2022-02-10 ENCOUNTER — Ambulatory Visit: Payer: No Typology Code available for payment source

## 2022-02-24 ENCOUNTER — Ambulatory Visit (INDEPENDENT_AMBULATORY_CARE_PROVIDER_SITE_OTHER): Payer: No Typology Code available for payment source

## 2022-02-24 DIAGNOSIS — E538 Deficiency of other specified B group vitamins: Secondary | ICD-10-CM

## 2022-02-24 MED ORDER — CYANOCOBALAMIN 1000 MCG/ML IJ SOLN
1000.0000 ug | Freq: Once | INTRAMUSCULAR | Status: AC
  Administered 2022-02-24: 1000 ug via INTRAMUSCULAR

## 2022-02-24 NOTE — Progress Notes (Signed)
Patient here for monthly b12 injection per pcp orders. ? ?Injection given in left deltoid and patient tolerated well. ? ?Patient has been scheduled for next appointment. ?

## 2022-03-01 NOTE — Progress Notes (Signed)
Patient here for monthly b12 injection per pcp orders. ? ?Injection given in left deltoid and patient tolerated well. ? ?Patient has been scheduled for next appointment. ?

## 2022-03-25 ENCOUNTER — Ambulatory Visit (INDEPENDENT_AMBULATORY_CARE_PROVIDER_SITE_OTHER): Payer: No Typology Code available for payment source

## 2022-03-25 DIAGNOSIS — E538 Deficiency of other specified B group vitamins: Secondary | ICD-10-CM | POA: Diagnosis not present

## 2022-03-25 MED ORDER — CYANOCOBALAMIN 1000 MCG/ML IJ SOLN
1000.0000 ug | Freq: Once | INTRAMUSCULAR | Status: AC
  Administered 2022-03-25: 1000 ug via INTRAMUSCULAR

## 2022-03-25 NOTE — Progress Notes (Signed)
Per orders of Dr. Raoul Pitch, pt is here for B12 injection. pt received B12 injection in right deltoid at 09:15 am. Given by [place] L. CMA/CPT. Pt tolerated B12 injection well. Pt will return in 1 month for next B12 injection.   Patricia Alvarez L.  CMA/CPT

## 2022-04-22 ENCOUNTER — Ambulatory Visit (INDEPENDENT_AMBULATORY_CARE_PROVIDER_SITE_OTHER): Payer: No Typology Code available for payment source

## 2022-04-22 DIAGNOSIS — E538 Deficiency of other specified B group vitamins: Secondary | ICD-10-CM

## 2022-04-22 MED ORDER — CYANOCOBALAMIN 1000 MCG/ML IJ SOLN
1000.0000 ug | Freq: Once | INTRAMUSCULAR | Status: AC
  Administered 2022-04-22: 1000 ug via INTRAMUSCULAR

## 2022-04-25 ENCOUNTER — Encounter: Payer: Self-pay | Admitting: Family Medicine

## 2022-05-19 ENCOUNTER — Other Ambulatory Visit: Payer: Self-pay | Admitting: Obstetrics and Gynecology

## 2022-05-19 DIAGNOSIS — Z1231 Encounter for screening mammogram for malignant neoplasm of breast: Secondary | ICD-10-CM

## 2022-05-20 ENCOUNTER — Ambulatory Visit (INDEPENDENT_AMBULATORY_CARE_PROVIDER_SITE_OTHER): Payer: No Typology Code available for payment source

## 2022-05-20 DIAGNOSIS — E538 Deficiency of other specified B group vitamins: Secondary | ICD-10-CM | POA: Diagnosis not present

## 2022-05-20 MED ORDER — CYANOCOBALAMIN 1000 MCG/ML IJ SOLN
1000.0000 ug | Freq: Once | INTRAMUSCULAR | Status: AC
  Administered 2022-05-20: 1000 ug via INTRAMUSCULAR

## 2022-05-20 NOTE — Progress Notes (Signed)
Patricia Alvarez is a 61 y.o. female presents to the office today for monthly B12 injections, per physician's orders. Original order: 07/14/2019 cyanocobalamin, 1059mg , IM was administered left deltoid today. Patient tolerated injection. Patient due for follow up labs/provider appt: No.  Patient next injection due: 1 month, appt made yes

## 2022-05-20 NOTE — Addendum Note (Signed)
Addended by: Octaviano Glow on: 05/20/2022 09:22 AM   Modules accepted: Orders

## 2022-06-03 NOTE — Progress Notes (Unsigned)
PATIENT: Patricia Alvarez DOB: [birth year]  REASON FOR VISIT: follow up HISTORY FROM: patient  No chief complaint on file.   HISTORY OF PRESENT ILLNESS:  06/03/22 ALL: Patricia Alvarez returns for follow up for migraines. We switched abortie therapy from rizatriptan to Nurtec at last visit, however, it was not effective and switched to [place] in 06/2021. Since,   06/04/2021 ALL: She returns for follow up for migraines. She continues rizatriptan for abortive therapy. She feels that she is doing fairly well. She reports having about 8 headache days a month, half are migrainous. She feels rizatriptan continues to be helpful for abortive therapy but she usually has to repeat dose. She has more sleepiness with second dose and sometimes has a rebound headache the next day. She denies any other syncopal/unresponsive spells. She feels this was related to stress with previous employment.   05/05/2019 ALL: Patricia Alvarez is a 61 y.o. female here today for follow up for follow-up of migraines.  She continues to have approximately 1-2 migraines per month.  She reports that rizatriptan works if she catches her headaches soon enough.  She has been attempting treatment with Tylenol prior to this prescription.  She reports that sometimes she has to take 2 doses of rizatriptan to abort migraine.  Otherwise she is doing well.  No other concerns today.  HISTORY: (copied from Dr Gladstone Lighter note on 11/08/2018)  UPDATE (11/08/18, VRP): Since last visit, doing well. She reports that she has had a migraine about once or twice a month since being seen in July, 2019. She reports that the migraine is usually located behind her ear (usually right sided but can occur on the left). Pain is described as a pulsating, pressure sensation that radiates up to the top of her head. She will have light sensitivity and occasionally nausea with migraine. She can lie in a dark room with light pressure to her head and headaches improve. She has noted  an unusual smell prior to migraine. She is taking Maxalt with onset of migraine and this usually helps. She feels that she usually has another migraine the following day that is relieved with Maxalt as well. She rarely takes an Excedrin for non migrainous headaches. She denies stroke like symptoms with migraine. She is contributing the improvement to treatment of vitamin B12 and vitamin D deficiency. She has been treated with B12 injections as well as oral supplements. She is also taking vitamin D and magnesium supplements OTC. She reports that her numbness and tingling in her right hand and arm is significantly improved.    PRIOR HPI (05/03/2018, VRP): 61 year old female here for evaluation of headaches.   Patient had onset of headaches sometime in her late 56s.  She describes pounding, unilateral, mainly left-sided, throbbing severe headaches associated with nausea, blurred vision, sensitivity to sound, seeing dots and spots.  Headaches were intermittent for many years.  Patient treated these with over-the-counter medications.   The last couple of years patient has had slight change in her headaches with sharp shooting pain in her left ear, left eye, facial numbness, lip numbness, drooling sensation, arm numbness.  These also were associated with headaches.  Patient feeling headaches and pain behind her ears and behind her head.  Headaches increased in April 2019.  Now patient having headaches 3-4 times per week.  Patient had a CT scan and MRI of the brain, as well as emergency room evaluation, and was diagnosed with probable complicated migraine.  She was prescribed amitriptyline but  has not tried it yet.   Patient has family history of migraine in her mother.  No specific triggering factors currently for her headaches.   Patient has a long history of somewhat interrupted sleep and mild insomnia.  She is able to fall asleep without difficulty but then tends to wake up in the middle the night and has  difficulty falling back asleep.    REVIEW OF SYSTEMS: Out of a complete 14 system review of symptoms, the patient complains only of the following symptoms, headaches and all other reviewed systems are negative.  ALLERGIES: Allergies  Allergen Reactions   Codeine Itching and Nausea Only    REACTION: nausea   Gluten Meal Other (See Comments)    Stomach swells    Morphine Nausea Only    REACTION: nausea   Oxycodone-Acetaminophen Nausea And Vomiting   Prednisone     REACTION: Hyperactive   Sulfa Antibiotics Nausea Only    HOME MEDICATIONS: Outpatient Medications Prior to Visit  Medication Sig Dispense Refill   aspirin-acetaminophen-caffeine (EXCEDRIN MIGRAINE) 250-250-65 MG tablet Take by mouth as needed for headache.      estradiol (ESTRACE) 0.5 MG tablet Take 0.5 mg by mouth daily.   3   famotidine (PEPCID) 40 MG tablet Take 1 tablet (40 mg total) by mouth at bedtime. (Patient taking differently: Take 40 mg by mouth as needed.) 30 tablet 11   hydrOXYzine (ATARAX) 10 MG tablet TAKE 1/2 TO 3 TABLETS BY MOUTH AT BEDTIME. 270 tablet 1   omeprazole (PRILOSEC) 20 MG capsule Take 1 capsule (20 mg total) by mouth 2 (two) times daily before a meal. (Patient taking differently: Take 20 mg by mouth daily.) 60 capsule 11   rizatriptan (MAXALT-MLT) 10 MG disintegrating tablet Take 1 tablet (10 mg total) by mouth as needed for migraine. May repeat in 2 hours if needed 9 tablet 12   Ubrogepant (UBRELVY) 100 MG TABS Take 100 mg by mouth daily as needed. Take one tablet at onset of headache, may repeat 1 tablet in 2 hours, no more than 2 tablets in 24 hours 10 tablet 11   vitamin B-12 (CYANOCOBALAMIN) 1000 MCG tablet Take 1,000 mcg by mouth daily.     Vitamin D, Ergocalciferol, 2000 units CAPS Take 2,000 Units by mouth daily.     No facility-administered medications prior to visit.    PAST MEDICAL HISTORY: Past Medical History:  Diagnosis Date   Allergic rhinitis    Allergy    B12 deficiency     Back pain    Chicken pox    Colon polyp 2015   TUBULAR ADENOMA (X 1).   DDD (degenerative disc disease), cervical    Endometriosis of uterus    GERD (gastroesophageal reflux disease)    Hyperlipidemia    IBS (irritable bowel syndrome)    Internal hemorrhoid    Migraines    Rectal bleeding    Vitamin D deficiency     PAST SURGICAL HISTORY: Past Surgical History:  Procedure Laterality Date   ABDOMINAL HYSTERECTOMY  1994   APPENDECTOMY  02/2000   and ovary   AUGMENTATION MAMMAPLASTY Bilateral 1998   Saline,retropectoral    COLONOSCOPY W/ BIOPSIES  2015   multiple   COSMETIC SURGERY  Tummy tuck   ESOPHAGOGASTRODUODENOSCOPY  2011   multiple   JOINT REPLACEMENT  2019   LAPAROSCOPY     OOPHORECTOMY Left 02/2000   w/appendectomy   PARTIAL HYSTERECTOMY  1994   right ovary and uterus   TOTAL HIP ARTHROPLASTY  Right 08/2018   August 31, 2018   TUBAL LIGATION  1990    FAMILY HISTORY: Family History  Problem Relation Age of Onset   Hyperlipidemia Mother 66   Heart failure Mother 56   Arthritis Mother    Asthma Mother    Hearing loss Mother    Heart disease Mother    Hypertension Mother    Miscarriages / [place] Mother    Colon polyps Father    Stroke Father    Hearing loss Father    Breast cancer Maternal Grandmother 77   Colon cancer Neg Hx    Esophageal cancer Neg Hx    Rectal cancer Neg Hx    Stomach cancer Neg Hx     SOCIAL HISTORY: Social History   Socioeconomic History   Marital status: Married    Spouse name: Not on file   Number of children: 2   Years of education: 12   Highest education level: Not on file  Occupational History   Occupation: Forensic psychologist: [employer]  Tobacco Use   Smoking status: Never   Smokeless tobacco: Never  Vaping Use   Vaping Use: Never used  Substance and Sexual Activity   Alcohol use: Never   Drug use: Never   Sexual activity: Yes    Partners: Male    Birth control/protection: Surgical   Other Topics Concern   Not on file  Social History Narrative   Married. 2 children.    HS graduate. Employed as a Herbalist.    Uses bicycle helmet, smoke alarm in the home.   Feels safe in her relationships.   Caffeine- unsweet 1 quart most days.    Social Determinants of Health   Financial Resource Strain: Not on file  Food Insecurity: Not on file  Transportation Needs: Not on file  Physical Activity: Not on file  Stress: Not on file  Social Connections: Not on file  Intimate Partner Violence: Not on file      PHYSICAL EXAM  There were no vitals filed for this visit.   There is no height or weight on file to calculate BMI.  Generalized: Well developed, in no acute distress  Cardiology: normal rate and rhythm, no murmur noted Neurological examination  Mentation: Alert oriented to time, place, history taking. Follows all commands speech and language fluent Cranial nerve II-XII: Pupils were equal round reactive to light. Extraocular movements were full, visual field were full on confrontational test. Facial sensation and strength were normal. Head turning and shoulder shrug  were normal and symmetric. Motor: The motor testing reveals 5 over 5 strength of all 4 extremities. Good symmetric motor tone is noted throughout.  Gait and station: Gait is normal.    DIAGNOSTIC DATA (LABS, IMAGING, TESTING) - I reviewed patient records, labs, notes, testing and imaging myself where available.      No data to display           Lab Results  Component Value Date   WBC 4.0 08/15/2021   HGB 13.4 08/15/2021   HCT 40.2 08/15/2021   MCV 97.0 08/15/2021   PLT 253.0 08/15/2021      Component Value Date/Time   NA 140 08/15/2021 0932   NA 143 05/25/2019 0000   K 4.2 08/15/2021 0932   CL 104 08/15/2021 0932   CO2 30 08/15/2021 0932   GLUCOSE 83 08/15/2021 0932   BUN 11 08/15/2021 0932   BUN 11 05/25/2019 0000   CREATININE 0.63 08/15/2021  0932   CALCIUM 9.0 08/15/2021  0932   PROT 6.1 08/15/2021 0932   ALBUMIN 4.0 08/15/2021 0932   AST 20 08/15/2021 0932   ALT 10 08/15/2021 0932   ALKPHOS 57 08/15/2021 0932   BILITOT 0.5 08/15/2021 0932   GFRNONAA >60 04/15/2018 1137   GFRAA >60 04/15/2018 1137   Lab Results  Component Value Date   CHOL 217 (H) 08/15/2021   HDL 64.50 08/15/2021   LDLCALC 134 (H) 08/15/2021   LDLDIRECT 168.7 01/06/2008   TRIG 93.0 08/15/2021   CHOLHDL 3 08/15/2021   Lab Results  Component Value Date   HGBA1C 5.5 08/15/2021   Lab Results  Component Value Date   VITAMINB12 1,040 (H) 08/15/2021   Lab Results  Component Value Date   TSH 3.32 08/15/2021     ASSESSMENT AND PLAN 61 y.o. year old female  has a past medical history of Allergic rhinitis, Allergy, B12 deficiency, Back pain, Chicken pox, Colon polyp (2015), DDD (degenerative disc disease), cervical, Endometriosis of uterus, GERD (gastroesophageal reflux disease), Hyperlipidemia, IBS (irritable bowel syndrome), Internal hemorrhoid, Migraines, Rectal bleeding, and Vitamin D deficiency. here with   No diagnosis found.    [place] feels that rizatriptan continues to offer abortive therapy, however, she is having to repeat dose leading to more side effects. We will try Nurtec. Potential side effects and appropriate administration discussed. Education material provided in AVS. She was give 2 sample tablets and copay card. She may use rizatriptan in combination with Nurtec for intractable headaches but advised against regular use of any abortive agent. Healthy lifestyle habits advised. Annual follow-up advised, sooner if needed.  She verbalizes understanding and agreement this plan.   No orders of the defined types were placed in this encounter.     No orders of the defined types were placed in this encounter.     Debbora Presto, FNP-C 06/03/2022, 11:00 AM [facility] [address], [place], [place] [address] [phone]

## 2022-06-03 NOTE — Patient Instructions (Signed)
Below is our plan:  We will continue Ubrelvy and rizatriptan as needed for migraine abortion. Avoid more than a total of 10 doses of either medication in a month.   Please make sure you are staying well hydrated. I recommend 50-60 ounces daily. Well balanced diet and regular exercise encouraged. Consistent sleep schedule with 6-8 hours recommended.   Please continue follow up with care team as directed.   Follow up with me in 1 year   You may receive a survey regarding today's visit. I encourage you to leave honest feed back as I do use this information to improve patient care. Thank you for seeing me today!

## 2022-06-04 ENCOUNTER — Ambulatory Visit: Payer: No Typology Code available for payment source | Admitting: Family Medicine

## 2022-06-04 ENCOUNTER — Encounter: Payer: Self-pay | Admitting: Family Medicine

## 2022-06-04 VITALS — BP 102/70 | HR 66 | Ht 64.25 in | Wt 145.0 lb

## 2022-06-04 DIAGNOSIS — G43109 Migraine with aura, not intractable, without status migrainosus: Secondary | ICD-10-CM | POA: Diagnosis not present

## 2022-06-04 MED ORDER — RIZATRIPTAN BENZOATE 10 MG PO TBDP
10.0000 mg | ORAL_TABLET | ORAL | 12 refills | Status: DC | PRN
Start: 2022-06-04 — End: 2024-03-30

## 2022-06-04 MED ORDER — UBRELVY 100 MG PO TABS: 100.0000 mg | ORAL_TABLET | Freq: Every day | ORAL | 11 refills | Status: DC | PRN

## 2022-06-17 ENCOUNTER — Ambulatory Visit: Payer: No Typology Code available for payment source

## 2022-06-23 ENCOUNTER — Ambulatory Visit (INDEPENDENT_AMBULATORY_CARE_PROVIDER_SITE_OTHER): Payer: No Typology Code available for payment source

## 2022-06-23 DIAGNOSIS — E538 Deficiency of other specified B group vitamins: Secondary | ICD-10-CM

## 2022-06-23 MED ORDER — CYANOCOBALAMIN 1000 MCG/ML IJ SOLN
1000.0000 ug | Freq: Once | INTRAMUSCULAR | Status: AC
  Administered 2022-06-23: 1000 ug via INTRAMUSCULAR

## 2022-06-23 NOTE — Progress Notes (Signed)
Patricia Alvarez is a 61 y.o. female presents to the office today for monthly B12 injections, per physician's orders. Original order: 07/14/2019 cyanocobalamin, 1065mg , IM was administered right deltoid today. Patient tolerated injection. Patient due for follow up labs/provider appt: No.  Patient next injection due: 1 month, appt made yes

## 2022-07-11 ENCOUNTER — Ambulatory Visit: Payer: No Typology Code available for payment source

## 2022-07-14 ENCOUNTER — Ambulatory Visit
Admission: RE | Admit: 2022-07-14 | Discharge: 2022-07-14 | Disposition: A | Discharge status: Home / Self Care | Payer: No Typology Code available for payment source | Source: Ambulatory Visit | Attending: Obstetrics and Gynecology | Admitting: Obstetrics and Gynecology

## 2022-07-14 DIAGNOSIS — Z1231 Encounter for screening mammogram for malignant neoplasm of breast: Secondary | ICD-10-CM

## 2022-07-21 ENCOUNTER — Ambulatory Visit (INDEPENDENT_AMBULATORY_CARE_PROVIDER_SITE_OTHER): Payer: No Typology Code available for payment source

## 2022-07-21 DIAGNOSIS — E538 Deficiency of other specified B group vitamins: Secondary | ICD-10-CM

## 2022-07-21 MED ORDER — CYANOCOBALAMIN 1000 MCG/ML IJ SOLN
1000.0000 ug | Freq: Once | INTRAMUSCULAR | Status: AC
  Administered 2022-07-21: 1000 ug via INTRAMUSCULAR

## 2022-07-22 NOTE — Progress Notes (Signed)
Patricia Alvarez is a 61 y.o. female presents to the office today for monthly B12 injections, per physician's orders. Original order: 07/14/2019 cyanocobalamin, 1029mg , IM was administered left deltoid today. Patient tolerated injection. Patient due for follow up labs/provider appt: No.  Patient next injection due: 1 month, appt made yes

## 2022-08-18 ENCOUNTER — Ambulatory Visit (INDEPENDENT_AMBULATORY_CARE_PROVIDER_SITE_OTHER): Payer: No Typology Code available for payment source

## 2022-08-18 DIAGNOSIS — E538 Deficiency of other specified B group vitamins: Secondary | ICD-10-CM

## 2022-08-18 MED ORDER — CYANOCOBALAMIN 1000 MCG/ML IJ SOLN
1000.0000 ug | Freq: Once | INTRAMUSCULAR | Status: AC
  Administered 2022-08-18: 1000 ug via INTRAMUSCULAR

## 2022-08-18 NOTE — Progress Notes (Signed)
Patricia Alvarez is a 61 y.o. female presents to the office today for monthly B12 injections, per physician's orders. Original order: 07/14/2019 cyanocobalamin, 1080mg , IM was administered right deltoid today. Patient tolerated injection. Patient due for follow up labs/provider appt: No.  Patient next injection due: 1 month, appt made yes

## 2022-08-28 NOTE — Progress Notes (Signed)
08/29/2022 Patricia Alvarez [id number] 1961-05-16  Referring provider: Ma Hillock, DO Primary GI doctor: Dr. Fuller Plan  ASSESSMENT AND PLAN:   Gastroesophageal reflux disease No dysphagia, no melena, no weight loss no alarm symptoms. 2011 EGD unremarkable Patient's been on PPI for years, controls her symptoms however she is concerned with being on long-term PPI use. Has B12 deficiency, and states she would prefer to try to get off PPI. Took herself off about 5 weeks ago and started on famotidine 40 mg in the morning, out of the 5 weeks she is only had 4 days with some minor reflux at night. Discussed elevating head of bed, discussed GERD lifestyle changes Can add on Pepcid at night as needed. Discussed possibility of repeating EGD but she prefers to not do that at this point as her symptoms are well controlled, follow-up 6 months and can discuss further then.  Gluten intolerance Continue gluten-free diet, does well with this.  Anemia History of B12 deficiency, patient has some concerns for decreased absorption of certain vitamins and minerals. Will check iron, folate patient's restless leg.  Hx of adenomatous colonic polyps 10/2018 colonoscopy recall 10/2025 Discussed call sooner if any symptoms for diagnostic colonoscopy.  History of Present Illness:  62 y.o. female  with a past medical history of GERD and gluten intolerance versus celiac disease and others listed below, returns to clinic today for evaluation of GERD.  10/2009 EGD unremarkable 10/2018 colonoscopy due to personal history of adenomatous colon polyps recall 10/2025 12/2020 CT abdomen pelvis unremarkable 07/19/2021 office visit with Dr. Fuller Plan for follow-up of GERD scheduled for EGD, I do not see where this was done  She has been on omeprazole for years.  She has decreased sleeping, no more than 4 hours for years. She has migraines.  After reading on the internet she was concerned some of her symptoms were from PPI  use, was only taking the omeprazole 20 mg, pepcid as needed.   She stopped her prilosec. Then for 5 weeks she has pepcid every morning and has been working well other than for 4 nights in the past week she would have GERD 830-9, does not eat after 6 pm.  She has throat clearing, she has hiccups in the night.  No dysphagia, no AB pain.  She does take B12 shots.  She does take NSAIDS as needed.  She is still strict with gluten free diet.  She has throat clearing frequently.   She  reports that she has never smoked. She has never used smokeless tobacco. She reports that she does not drink alcohol and does not use drugs. Her family history includes Arthritis in her mother; Asthma in her mother; Breast cancer (age of onset: 78) in her maternal grandmother; Colon polyps in her father; Hearing loss in her father and mother; Heart disease in her mother; Heart failure (age of onset: 106) in her mother; Hyperlipidemia (age of onset: 56) in her mother; Hypertension in her mother; Miscarriages / Stillbirths in her mother; Stroke in her father.   Current Medications:   Current Outpatient Medications (Endocrine & Metabolic):    estradiol (ESTRACE) 0.5 MG tablet, Take 0.5 mg by mouth daily.     Current Outpatient Medications (Analgesics):    aspirin-acetaminophen-caffeine (EXCEDRIN MIGRAINE) 250-250-65 MG tablet, Take by mouth as needed for headache.    rizatriptan (MAXALT-MLT) 10 MG disintegrating tablet, Take 1 tablet (10 mg total) by mouth as needed for migraine. May repeat in 2 hours if needed  Ubrogepant (UBRELVY) 100 MG TABS, Take 100 mg by mouth daily as needed. Take one tablet at onset of headache, may repeat 1 tablet in 2 hours, no more than 2 tablets in 24 hours  Current Outpatient Medications (Hematological):    vitamin B-12 (CYANOCOBALAMIN) 1000 MCG tablet, Take 1,000 mcg by mouth daily.  Current Outpatient Medications (Other):    famotidine (PEPCID) 40 MG tablet, Take 1 tablet (40 mg  total) by mouth at bedtime. (Patient taking differently: Take 40 mg by mouth as needed.)   Vitamin D, Ergocalciferol, 2000 units CAPS, Take 2,000 Units by mouth daily.   omeprazole (PRILOSEC) 20 MG capsule, Take 1 capsule (20 mg total) by mouth 2 (two) times daily before a meal. (Patient not taking: Reported on 08/29/2022)  Surgical History:  She  has a past surgical history that includes Appendectomy (02/2000); Partial hysterectomy (1994); laparoscopy; Colonoscopy w/ biopsies (2015); Esophagogastroduodenoscopy (2011); Oophorectomy (Left, 02/2000); Augmentation mammaplasty (Bilateral, 1998); Total hip arthroplasty (Right, 08/2018); Cosmetic surgery (Tummy tuck); Joint replacement (2019); Tubal ligation (1990); and Abdominal hysterectomy (1994).  Current Medications, Allergies, Past Medical History, Past Surgical History, Family History and Social History were reviewed in [employer] record.  Physical Exam: BP 110/78   Pulse 65   SpO2 98%  General:   Pleasant, well developed female in no acute distress Heart : Regular rate and rhythm; no murmurs Pulm: Clear anteriorly; no wheezing Abdomen:  Soft, Flat AB, Active bowel sounds. No tenderness . , No organomegaly appreciated. Rectal: Not evaluated Extremities:  without  edema. Neurologic:  Alert and  oriented x4;  No focal deficits.  Psych:  Cooperative. Normal mood and affect.   Vladimir Crofts, PA-C 08/29/22

## 2022-08-29 ENCOUNTER — Other Ambulatory Visit (INDEPENDENT_AMBULATORY_CARE_PROVIDER_SITE_OTHER): Payer: No Typology Code available for payment source

## 2022-08-29 ENCOUNTER — Encounter: Payer: Self-pay | Admitting: Physician Assistant

## 2022-08-29 ENCOUNTER — Other Ambulatory Visit: Payer: Self-pay

## 2022-08-29 ENCOUNTER — Ambulatory Visit: Payer: No Typology Code available for payment source | Admitting: Physician Assistant

## 2022-08-29 VITALS — BP 110/78 | HR 65

## 2022-08-29 DIAGNOSIS — K9041 Non-celiac gluten sensitivity: Secondary | ICD-10-CM

## 2022-08-29 DIAGNOSIS — D649 Anemia, unspecified: Secondary | ICD-10-CM

## 2022-08-29 DIAGNOSIS — K219 Gastro-esophageal reflux disease without esophagitis: Secondary | ICD-10-CM

## 2022-08-29 DIAGNOSIS — Z86010 Personal history of colonic polyps: Secondary | ICD-10-CM | POA: Diagnosis not present

## 2022-08-29 LAB — CBC WITH DIFFERENTIAL/PLATELET
Basophils Absolute: 0.1 10*3/uL (ref 0.0–0.1)
Basophils Relative: 1.2 % (ref 0.0–3.0)
Eosinophils Absolute: 0.1 10*3/uL (ref 0.0–0.7)
Eosinophils Relative: 1.1 % (ref 0.0–5.0)
HCT: 41.8 % (ref 36.0–46.0)
Hemoglobin: 13.9 g/dL (ref 12.0–15.0)
Lymphocytes Relative: 31.9 % (ref 12.0–46.0)
Lymphs Abs: 1.9 10*3/uL (ref 0.7–4.0)
MCHC: 33.3 g/dL (ref 30.0–36.0)
MCV: 93 fl (ref 78.0–100.0)
Monocytes Absolute: 0.3 10*3/uL (ref 0.1–1.0)
Monocytes Relative: 5.1 % (ref 3.0–12.0)
Neutro Abs: 3.7 10*3/uL (ref 1.4–7.7)
Neutrophils Relative %: 60.7 % (ref 43.0–77.0)
Platelets: 245 10*3/uL (ref 150.0–400.0)
RBC: 4.5 Mil/uL (ref 3.87–5.11)
RDW: 14 % (ref 11.5–15.5)
WBC: 6.1 10*3/uL (ref 4.0–10.5)

## 2022-08-29 LAB — COMPREHENSIVE METABOLIC PANEL
ALT: 9 U/L (ref 0–35)
AST: 19 U/L (ref 0–37)
Albumin: 4.2 g/dL (ref 3.5–5.2)
Alkaline Phosphatase: 65 U/L (ref 39–117)
BUN: 9 mg/dL (ref 6–23)
CO2: 31 mEq/L (ref 19–32)
Calcium: 9.4 mg/dL (ref 8.4–10.5)
Chloride: 103 mEq/L (ref 96–112)
Creatinine, Ser: 0.57 mg/dL (ref 0.40–1.20)
GFR: 99.08 mL/min (ref >60.00)
Glucose, Bld: 90 mg/dL (ref 70–99)
Potassium: 4.8 mEq/L (ref 3.5–5.1)
Sodium: 138 mEq/L (ref 135–145)
Total Bilirubin: 0.4 mg/dL (ref 0.2–1.2)
Total Protein: 6.8 g/dL (ref 6.0–8.3)

## 2022-08-29 LAB — IBC + FERRITIN
Ferritin: 9.2 ng/mL — ABNORMAL LOW (ref 10.0–291.0)
Iron: 110 ug/dL (ref 42–145)
Saturation Ratios: 26.7 % (ref 20.0–50.0)
TIBC: 411.6 ug/dL (ref 250.0–450.0)
Transferrin: 294 mg/dL (ref 212.0–360.0)

## 2022-08-29 LAB — FOLATE: Folate: 5.3 ng/mL — ABNORMAL LOW (ref >5.9)

## 2022-08-29 LAB — MAGNESIUM: Magnesium: 2.1 mg/dL (ref 1.5–2.5)

## 2022-08-29 NOTE — Patient Instructions (Addendum)
Your provider has requested that you go to the basement level for lab work before leaving today. Press "B" on the elevator. The lab is located at the first door on the left as you exit the elevator.   Can continue the pepcid in the morning and pepcid as needed at night Can do omeprazole with aleve if you take it, try to avoid.  Avoid spicy and acidic foods Avoid fatty foods Limit your intake of coffee, tea, alcohol, and carbonated drinks Work to maintain a healthy weight Keep the head of the bed elevated at least 3 inches with blocks or a wedge pillow if you are having any nighttime symptoms Stay upright for 2 hours after eating Avoid meals and snacks three to four hours before bedtime  Can get cardiac score.   Silent reflux: Not all heartburn burns...Marland KitchenMarland KitchenMarland Kitchen  What is LPR? Laryngopharyngeal reflux (LPR) or silent reflux is a condition in which acid that is made in the stomach travels up the esophagus (swallowing tube) and gets to the throat. Not everyone with reflux has a lot of heartburn or indigestion. In fact, many people with LPR never have heartburn. This is why LPR is called SILENT REFLUX, and the terms "Silent reflux" and "LPR" are often used interchangeably. Because LPR is silent, it is sometimes difficult to diagnose.  How can you tell if you have LPR?  Chronic hoarseness- Some people have hoarseness that comes and goes throat clearing  Cough It can cause shortness of breath and cause asthma like symptoms. a feeling of a lump in the throat  difficulty swallowing a problem with too much nose and throat drainage.  Some people will feel their esophagus spasm which feels like their heart beating hard and fast, this will usually be after a meal, at rest, or lying down at night.    How do I treat this? Treatment for LPR should be individualized, and your doctor will suggest the best treatment for you. Generally there are several treatments for LPR: changing habits and diet to reduce  reflux,  medications to reduce stomach acid, and  surgery to prevent reflux. Most people with LPR need to modify how and when they eat, as well as take some medication, to get well. Sometimes, nonprescription liquid antacids, such as Maalox, Gelucil and Mylanta are recommended. When used, these antacids should be taken four times each day - one tablespoon one hour after each meal and before bedtime. Dietary and lifestyle changes alone are not often enough to control LPR - medications that reduce stomach acid are also usually needed. These must be prescribed by our doctor.   TIPS FOR REDUCING REFLUX AND LPR Control your LIFE-STYLE and your DIET! If you use tobacco, QUIT.  Smoking makes you reflux. After every cigarette you have some LPR.  Don't wear clothing that is too tight, especially around the waist (trousers, corsets, belts).  Do not lie down just after eating...in fact, do not eat within three hours of bedtime.  You should be on a low-fat diet.  Limit your intake of red meat.  Limit your intake of butter.  Avoid fried foods.  Avoid chocolate  Avoid cheese.  Avoid eggs. Specifically avoid caffeine (especially coffee and tea), soda pop (especially cola) and mints.  Avoid alcoholic beverages, particularly in the evening.

## 2022-09-01 ENCOUNTER — Other Ambulatory Visit: Payer: Self-pay

## 2022-09-01 DIAGNOSIS — D649 Anemia, unspecified: Secondary | ICD-10-CM

## 2022-09-01 DIAGNOSIS — R790 Abnormal level of blood mineral: Secondary | ICD-10-CM

## 2022-09-01 DIAGNOSIS — K219 Gastro-esophageal reflux disease without esophagitis: Secondary | ICD-10-CM

## 2022-09-01 DIAGNOSIS — K9041 Non-celiac gluten sensitivity: Secondary | ICD-10-CM

## 2022-09-01 MED ORDER — FOLIC ACID 1 MG PO TABS: 1.0000 mg | ORAL_TABLET | Freq: Every day | ORAL | 3 refills | Status: DC

## 2022-09-02 ENCOUNTER — Encounter: Payer: Self-pay | Admitting: Gastroenterology

## 2022-09-15 ENCOUNTER — Ambulatory Visit (INDEPENDENT_AMBULATORY_CARE_PROVIDER_SITE_OTHER): Payer: No Typology Code available for payment source

## 2022-09-15 DIAGNOSIS — E538 Deficiency of other specified B group vitamins: Secondary | ICD-10-CM | POA: Diagnosis not present

## 2022-09-15 MED ORDER — CYANOCOBALAMIN 1000 MCG/ML IJ SOLN
1000.0000 ug | Freq: Once | INTRAMUSCULAR | Status: AC
  Administered 2022-09-15: 1000 ug via INTRAMUSCULAR

## 2022-09-15 NOTE — Progress Notes (Signed)
Pt here for monthly B12 injection per Dr.Kuneff  B12 1059mg given IM, and pt tolerated injection well.  Next B12 injection scheduled for 10/13/22

## 2022-09-22 ENCOUNTER — Encounter: Payer: Self-pay | Admitting: Gastroenterology

## 2022-09-22 ENCOUNTER — Ambulatory Visit (AMBULATORY_SURGERY_CENTER): Payer: No Typology Code available for payment source | Admitting: Gastroenterology

## 2022-09-22 VITALS — BP 103/68 | HR 85 | Temp 97.1°F | Resp 14 | Ht 64.0 in | Wt 143.0 lb

## 2022-09-22 DIAGNOSIS — K219 Gastro-esophageal reflux disease without esophagitis: Secondary | ICD-10-CM | POA: Diagnosis present

## 2022-09-22 DIAGNOSIS — D509 Iron deficiency anemia, unspecified: Secondary | ICD-10-CM | POA: Diagnosis not present

## 2022-09-22 DIAGNOSIS — K2100 Gastro-esophageal reflux disease with esophagitis, without bleeding: Secondary | ICD-10-CM

## 2022-09-22 MED ORDER — ESOMEPRAZOLE MAGNESIUM 20 MG PO CPDR: 20.0000 mg | DELAYED_RELEASE_CAPSULE | Freq: Two times a day (BID) | ORAL | 3 refills | Status: DC

## 2022-09-22 MED ORDER — SODIUM CHLORIDE 0.9 % IV SOLN: 500.0000 mL | Freq: Once | INTRAVENOUS | Status: DC

## 2022-09-22 MED ORDER — IRON (FERROUS SULFATE) 325 (65 FE) MG PO TABS: 325.0000 mg | ORAL_TABLET | Freq: Every day | ORAL | 2 refills | Status: DC

## 2022-09-22 NOTE — Op Note (Signed)
[facility] Patient Name: Patricia Alvarez Procedure Date: 09/22/2022 4:11 PM MRN: [id number] Endoscopist: Ladene Artist , MD, [id number] Age: 61 Referring MD:  Date of Birth: April 23, 1961 Gender: Female Account #: [id number] Procedure:                Upper GI endoscopy Indications:              Iron deficiency without anemia, Gastroesophageal                            reflux disease Medicines:                Monitored Anesthesia Care Procedure:                Pre-Anesthesia Assessment:                           - Prior to the procedure, a History and Physical                            was performed, and patient medications and                            allergies were reviewed. The patient's tolerance of                            previous anesthesia was also reviewed. The risks                            and benefits of the procedure and the sedation                            options and risks were discussed with the patient.                            All questions were answered, and informed consent                            was obtained. Prior Anticoagulants: The patient has                            taken no anticoagulant or antiplatelet agents. ASA                            Grade Assessment: II - A patient with mild systemic                            disease. After reviewing the risks and benefits,                            the patient was deemed in satisfactory condition to                            undergo the procedure.  After obtaining informed consent, the endoscope was                            passed under direct vision. Throughout the                            procedure, the patient's blood pressure, pulse, and                            oxygen saturations were monitored continuously. The                            GIF [id number] #[id number] was introduced through the                            mouth, and advanced to the second part of  duodenum.                            The upper GI endoscopy was accomplished without                            difficulty. The patient tolerated the procedure                            well. Scope In: Scope Out: Findings:                 LA Grade A (one or more mucosal breaks less than 5                            mm, not extending between tops of 2 mucosal folds)                            esophagitis with no bleeding was found at the                            gastroesophageal junction.                           The exam of the esophagus was otherwise normal.                           A small hiatal hernia was present. Hill Grade II.                           The exam of the stomach was otherwise normal.                           The duodenal bulb and second portion of the                            duodenum were normal. Biopsies for histology were  taken with a cold forceps for evaluation of celiac                            disease. Complications:            No immediate complications. Estimated Blood Loss:     Estimated blood loss was minimal. Impression:               - LA Grade A reflux esophagitis with no bleeding.                           - Small hiatal hernia.                           - Normal duodenal bulb and second portion of the                            duodenum. Biopsied. Recommendation:           - Patient has a contact number available for                            emergencies. The signs and symptoms of potential                            delayed complications were discussed with the                            patient. Return to normal activities tomorrow.                            Written discharge instructions were provided to the                            patient.                           - Resume previous diet.                           - Follow antireflux measures long term.                           - Continue present  medications.                           - Discontinue omeprazole.                           - Start Nexium 20 mg po bid, 1 year of refills.                           - If GERD symptoms are not adequately controlled                            add famotidine 40 mg po hs.                           -  FeSO4 325 mg po qd with food, #30, 2 refills.                           - GI office appt in 6 weeks.                           - Await pathology results. Ladene Artist, MD 09/22/2022 4:29:37 PM This report has been signed electronically.

## 2022-09-22 NOTE — Progress Notes (Signed)
Pt's states no medical or surgical changes since previsit or office visit. 

## 2022-09-22 NOTE — Progress Notes (Signed)
Pt resting comfortably. VSS. Airway intact. SBAR complete to RN. All questions answered.   

## 2022-09-22 NOTE — Patient Instructions (Signed)
Discontinue Omeprazole, start Nexium 20 mg twice daily.  Resume previous diet. Continue medications.  If GERD symptoms are not adequately controlled add famotidine 40 mg at bedtime. Iron 325 mg daily daily with food.  Call office to schedule follow up appointment for 6 weeks.    YOU HAD AN ENDOSCOPIC PROCEDURE TODAY AT [facility]:   Refer to the procedure report that was given to you for any specific questions about what was found during the examination.  If the procedure report does not answer your questions, please call your gastroenterologist to clarify.  If you requested that your care partner not be given the details of your procedure findings, then the procedure report has been included in a sealed envelope for you to review at your convenience later.  YOU SHOULD EXPECT: Some feelings of bloating in the abdomen. Passage of more gas than usual.  Walking can help get rid of the air that was put into your GI tract during the procedure and reduce the bloating. If you had a lower endoscopy (such as a colonoscopy or flexible sigmoidoscopy) you may notice spotting of blood in your stool or on the toilet paper. If you underwent a bowel prep for your procedure, you may not have a normal bowel movement for a few days.  Please Note:  You might notice some irritation and congestion in your nose or some drainage.  This is from the oxygen used during your procedure.  There is no need for concern and it should clear up in a day or so.  SYMPTOMS TO REPORT IMMEDIATELY:   Following upper endoscopy (EGD)  Vomiting of blood or coffee ground material  New chest pain or pain under the shoulder blades  Painful or persistently difficult swallowing  New shortness of breath  Fever of 100F or higher  Black, tarry-looking stools  For urgent or emergent issues, a gastroenterologist can be reached at any hour by calling [phone]. Do not use MyChart messaging for urgent concerns.    DIET:  We  do recommend a small meal at first, but then you may proceed to your regular diet.  Drink plenty of fluids but you should avoid alcoholic beverages for 24 hours.  ACTIVITY:  You should plan to take it easy for the rest of today and you should NOT DRIVE or use heavy machinery until tomorrow (because of the sedation medicines used during the test).    FOLLOW UP: Our staff will call the number listed on your records the next business day following your procedure.  We will call around 7:15- 8:00 am to check on you and address any questions or concerns that you may have regarding the information given to you following your procedure. If we do not reach you, we will leave a message.     If any biopsies were taken you will be contacted by phone or by letter within the next 1-3 weeks.  Please call us at [phone] if you have not heard about the biopsies in 3 weeks.    SIGNATURES/CONFIDENTIALITY: You and/or your care partner have signed paperwork which will be entered into your electronic medical record.  These signatures attest to the fact that that the information above on your After Visit Summary has been reviewed and is understood.  Full responsibility of the confidentiality of this discharge information lies with you and/or your care-partner.

## 2022-09-22 NOTE — Progress Notes (Signed)
Called to room to assist during endoscopic procedure.  Patient ID and intended procedure confirmed with present staff. Received instructions for my participation in the procedure from the performing physician.  

## 2022-09-22 NOTE — Progress Notes (Signed)
See 08/29/2022 H&P, no changes

## 2022-09-23 ENCOUNTER — Telehealth: Payer: Self-pay

## 2022-09-23 NOTE — Telephone Encounter (Signed)
  Follow up Call-     09/22/2022    2:20 PM  Call back number  Post procedure Call Back phone  # [phone]  Permission to leave phone message Yes     Patient questions:  Do you have a fever, pain , or abdominal swelling? No. Pain Score  0 *  Have you tolerated food without any problems? Yes.    Have you been able to return to your normal activities? Yes.    Do you have any questions about your discharge instructions: Diet   No. Medications  No. Follow up visit  No.  Do you have questions or concerns about your Care? No.  Actions: * If pain score is 4 or above: No action needed, pain <4.

## 2022-09-28 ENCOUNTER — Encounter: Payer: Self-pay | Admitting: Gastroenterology

## 2022-10-06 ENCOUNTER — Other Ambulatory Visit: Payer: Self-pay | Admitting: Gastroenterology

## 2022-10-08 ENCOUNTER — Encounter: Payer: Self-pay | Admitting: Gastroenterology

## 2022-10-13 ENCOUNTER — Ambulatory Visit (INDEPENDENT_AMBULATORY_CARE_PROVIDER_SITE_OTHER): Payer: No Typology Code available for payment source

## 2022-10-13 DIAGNOSIS — E538 Deficiency of other specified B group vitamins: Secondary | ICD-10-CM | POA: Diagnosis not present

## 2022-10-13 MED ORDER — CYANOCOBALAMIN 1000 MCG/ML IJ SOLN
1000.0000 ug | Freq: Once | INTRAMUSCULAR | Status: AC
  Administered 2022-10-13: 1000 ug via INTRAMUSCULAR

## 2022-10-13 NOTE — Progress Notes (Addendum)
Pt here today for monthly B12 shot. Shot was given with no issue. Rx provided by office.    

## 2022-10-28 DIAGNOSIS — K219 Gastro-esophageal reflux disease without esophagitis: Secondary | ICD-10-CM | POA: Insufficient documentation

## 2022-11-03 ENCOUNTER — Other Ambulatory Visit: Payer: Self-pay

## 2022-11-03 ENCOUNTER — Encounter: Payer: Self-pay | Admitting: Gastroenterology

## 2022-11-03 ENCOUNTER — Ambulatory Visit: Payer: BC Managed Care – PPO | Admitting: Gastroenterology

## 2022-11-03 ENCOUNTER — Other Ambulatory Visit (INDEPENDENT_AMBULATORY_CARE_PROVIDER_SITE_OTHER): Payer: BC Managed Care – PPO

## 2022-11-03 VITALS — BP 110/86 | HR 70 | Ht 64.0 in | Wt 147.0 lb

## 2022-11-03 DIAGNOSIS — K2100 Gastro-esophageal reflux disease with esophagitis, without bleeding: Secondary | ICD-10-CM

## 2022-11-03 DIAGNOSIS — D509 Iron deficiency anemia, unspecified: Secondary | ICD-10-CM

## 2022-11-03 LAB — IBC + FERRITIN
Ferritin: 14.3 ng/mL (ref 10.0–291.0)
Iron: 134 ug/dL (ref 42–145)
Saturation Ratios: 33.3 % (ref 20.0–50.0)
TIBC: 401.8 ug/dL (ref 250.0–450.0)
Transferrin: 287 mg/dL (ref 212.0–360.0)

## 2022-11-03 LAB — CBC WITH DIFFERENTIAL/PLATELET
Basophils Absolute: 0 10*3/uL (ref 0.0–0.1)
Basophils Relative: 0.8 % (ref 0.0–3.0)
Eosinophils Absolute: 0.3 10*3/uL (ref 0.0–0.7)
Eosinophils Relative: 5 % (ref 0.0–5.0)
HCT: 40.9 % (ref 36.0–46.0)
Hemoglobin: 14 g/dL (ref 12.0–15.0)
Lymphocytes Relative: 33.4 % (ref 12.0–46.0)
Lymphs Abs: 1.7 10*3/uL (ref 0.7–4.0)
MCHC: 34.1 g/dL (ref 30.0–36.0)
MCV: 92.9 fl (ref 78.0–100.0)
Monocytes Absolute: 0.3 10*3/uL (ref 0.1–1.0)
Monocytes Relative: 5.8 % (ref 3.0–12.0)
Neutro Abs: 2.8 10*3/uL (ref 1.4–7.7)
Neutrophils Relative %: 55 % (ref 43.0–77.0)
Platelets: 269 10*3/uL (ref 150.0–400.0)
RBC: 4.4 Mil/uL (ref 3.87–5.11)
RDW: 13.9 % (ref 11.5–15.5)
WBC: 5.1 10*3/uL (ref 4.0–10.5)

## 2022-11-03 MED ORDER — POLYSACCHARIDE IRON COMPLEX 150 MG PO CAPS: 150.0000 mg | ORAL_CAPSULE | Freq: Every day | ORAL | 0 refills | Status: DC

## 2022-11-03 NOTE — Patient Instructions (Signed)
Your provider has requested that you go to the basement level for lab work before leaving today. Press "B" on the elevator. The lab is located at the first door on the left as you exit the elevator.  The [facility] providers would like to encourage you to use [facility] to communicate with providers for non-urgent requests or questions.  Due to long hold times on the telephone, sending your provider a message by [facility] may be a faster and more efficient way to get a response.  Please allow 48 business hours for a response.  Please remember that this is for non-urgent requests.   Due to recent changes in healthcare laws, you may see the results of your imaging and laboratory studies on MyChart before your provider has had a chance to review them.  We understand that in some cases there may be results that are confusing or concerning to you. Not all laboratory results come back in the same time frame and the provider may be waiting for multiple results in order to interpret others.  Please give [place] 48 hours in order for your provider to thoroughly review all the results before contacting the office for clarification of your results.   Thank you for choosing me and [facility].  Pricilla Riffle. Dagoberto Ligas., MD., Marval Regal

## 2022-11-03 NOTE — Progress Notes (Signed)
    Assessment     GERD with LA Grade A esophagitis, symptoms controlled IDA with intolerance to iron therapy B12 deficiency Personal history of adenomatous colon polyps   Recommendations    Continue Nexium 20 mg daily and follow antireflux measures CBC, iron, TIBC, ferritin. If iron deficiency persists consider colonoscopy and/or VCE for further evaluation Surveillance colonoscopy recommended in January 2027 REV in 1 year  HPI    This is a 62 year old female returning for follow-up of GERD with esophagitis.  Her reflux symptoms are under very good control on Nexium 20 mg daily.  Famotidine taken previously was not effective.  She notes mild lower abdominal while taking iron.  She discontinued iron and the pain resolved.   EGD Nov 2023  - LA Grade A esophagitis with no bleeding. - Small hiatal hernia. - Normal duodenal bulb and second portion of the duodenum. Biopsied. (Normal)   Labs / Imaging       Latest Ref Rng & Units 08/29/2022   11:57 AM 08/15/2021    9:32 AM 12/24/2020    2:08 PM  Hepatic Function  Total Protein 6.0 - 8.3 g/dL 6.8  6.1  6.8   Albumin 3.5 - 5.2 g/dL 4.2  4.0  4.2   AST 0 - 37 U/L '19  20  15   '$ ALT 0 - 35 U/L '9  10  7   '$ Alk Phosphatase 39 - 117 U/L 65  57  68   Total Bilirubin 0.2 - 1.2 mg/dL 0.4  0.5  0.4        Latest Ref Rng & Units 08/29/2022   11:57 AM 08/15/2021    9:32 AM 12/24/2020    2:08 PM  CBC  WBC 4.0 - 10.5 K/uL 6.1  4.0  4.9   Hemoglobin 12.0 - 15.0 g/dL 13.9  13.4  13.7   Hematocrit 36.0 - 46.0 % 41.8  40.2  40.1   Platelets 150.0 - 400.0 K/uL 245.0  253.0  264.0     Current Medications, Allergies, Past Medical History, Past Surgical History, Family History and Social History were reviewed in [employer] record.   Physical Exam: General: Well developed, well nourished, no acute distress Head: Normocephalic and atraumatic Eyes: Sclerae anicteric, EOMI Ears: Normal auditory acuity Mouth: No  deformities or lesions noted Lungs: Clear throughout to auscultation Heart: Regular rate and rhythm; No murmurs, rubs or bruits Abdomen: Soft, non tender and non distended. No masses, hepatosplenomegaly or hernias noted. Normal Bowel sounds Rectal: Not done Musculoskeletal: Symmetrical with no gross deformities  Pulses:  Normal pulses noted Extremities: No edema or deformities noted Neurological: Alert oriented x 4, grossly nonfocal Psychological:  Alert and cooperative. Normal mood and affect   Marvyn Torrez T. Fuller Plan, MD 11/03/2022, 8:29 AM

## 2022-11-10 ENCOUNTER — Ambulatory Visit (INDEPENDENT_AMBULATORY_CARE_PROVIDER_SITE_OTHER): Payer: BC Managed Care – PPO

## 2022-11-10 DIAGNOSIS — E538 Deficiency of other specified B group vitamins: Secondary | ICD-10-CM

## 2022-11-10 MED ORDER — CYANOCOBALAMIN 1000 MCG/ML IJ SOLN
1000.0000 ug | Freq: Once | INTRAMUSCULAR | Status: AC
  Administered 2022-11-10: 1000 ug via INTRAMUSCULAR

## 2022-11-10 NOTE — Progress Notes (Signed)
Pt here today for monthly B12 shot. Shot was given with no issue. Rx provided by office.    

## 2022-11-17 DIAGNOSIS — L820 Inflamed seborrheic keratosis: Secondary | ICD-10-CM | POA: Diagnosis not present

## 2022-11-17 DIAGNOSIS — L821 Other seborrheic keratosis: Secondary | ICD-10-CM | POA: Diagnosis not present

## 2022-11-17 DIAGNOSIS — Z85828 Personal history of other malignant neoplasm of skin: Secondary | ICD-10-CM | POA: Diagnosis not present

## 2022-11-17 DIAGNOSIS — L720 Epidermal cyst: Secondary | ICD-10-CM | POA: Diagnosis not present

## 2022-11-25 ENCOUNTER — Other Ambulatory Visit: Payer: Self-pay | Admitting: Physician Assistant

## 2022-11-26 ENCOUNTER — Other Ambulatory Visit: Payer: Self-pay | Admitting: Gastroenterology

## 2022-11-27 ENCOUNTER — Ambulatory Visit (INDEPENDENT_AMBULATORY_CARE_PROVIDER_SITE_OTHER): Payer: BC Managed Care – PPO | Admitting: Family Medicine

## 2022-11-27 ENCOUNTER — Encounter: Payer: Self-pay | Admitting: Family Medicine

## 2022-11-27 VITALS — BP 104/72 | HR 74 | Temp 98.1°F | Ht 64.37 in | Wt 145.6 lb

## 2022-11-27 DIAGNOSIS — Z0000 Encounter for general adult medical examination without abnormal findings: Secondary | ICD-10-CM | POA: Diagnosis not present

## 2022-11-27 DIAGNOSIS — E782 Mixed hyperlipidemia: Secondary | ICD-10-CM

## 2022-11-27 DIAGNOSIS — E559 Vitamin D deficiency, unspecified: Secondary | ICD-10-CM

## 2022-11-27 DIAGNOSIS — Z131 Encounter for screening for diabetes mellitus: Secondary | ICD-10-CM

## 2022-11-27 DIAGNOSIS — M8580 Other specified disorders of bone density and structure, unspecified site: Secondary | ICD-10-CM | POA: Diagnosis not present

## 2022-11-27 DIAGNOSIS — E538 Deficiency of other specified B group vitamins: Secondary | ICD-10-CM

## 2022-11-27 LAB — LIPID PANEL
Cholesterol: 243 mg/dL — ABNORMAL HIGH (ref 0–200)
HDL: 57 mg/dL (ref >39.00)
LDL Cholesterol: 153 mg/dL — ABNORMAL HIGH (ref 0–99)
NonHDL: 185.58
Total CHOL/HDL Ratio: 4
Triglycerides: 163 mg/dL — ABNORMAL HIGH (ref 0.0–149.0)
VLDL: 32.6 mg/dL (ref 0.0–40.0)

## 2022-11-27 LAB — VITAMIN D 25 HYDROXY (VIT D DEFICIENCY, FRACTURES): VITD: 37.72 ng/mL (ref 30.00–100.00)

## 2022-11-27 LAB — HEMOGLOBIN A1C: Hgb A1c MFr Bld: 5.6 % (ref 4.6–6.5)

## 2022-11-27 LAB — VITAMIN B12: Vitamin B-12: 677 pg/mL (ref 211–911)

## 2022-11-27 LAB — TSH: TSH: 3.87 u[IU]/mL (ref 0.35–5.50)

## 2022-11-27 NOTE — Progress Notes (Signed)
Patient ID: Patricia Alvarez, female  DOB: 18-Jan-1961, 62 y.o.   MRN: [id number] Patient Care Team    Relationship Specialty Notifications Start End  Ma Hillock, DO PCP - General Family Medicine  03/25/22   Ladene Artist, MD Consulting Physician Gastroenterology  02/17/18   Delsa Sale, OD  Optometry  02/17/18   Molli Posey, MD Consulting Physician Obstetrics and Gynecology  02/17/18     Chief Complaint  Patient presents with   Annual Exam    Pt is fasting    Subjective:  Patricia Alvarez is a 62 y.o.  Female  present for CPE and Chronic Conditions/illness Management  All past medical history, surgical history, allergies, family history, immunizations, medications and social history were updated in the electronic medical record today. All recent labs, ED visits and hospitalizations within the last year were reviewed.   B12/Vit d def: Patient receives B12 injection by nurse visit  q 4 week for B12 deficiency.  This is a standing order for her as long as she follows every 12 months with cpe and recheck on b12. She feels the B12 has worked great for her and has seen a great improvement in her paresthesia of her face and migraines.   Health maintenance:  Colonoscopy: completed 10/2018, by Dr. Fuller Plan. follow up 7 yr. Mammogram: completed: 07/14/2022, birads Dr.Holland.  Cervical cancer screening: last pap: 2020,, completed by: Dr. Matthew Saras 3-84yrImmunizations: tdap 04/20/2022, Influenza declined today (encouraged yearly), shingrix completed Infectious disease screening: HIV and Hep C completed DEXA: completed at GYN- completed 2021, (records requested again  [3rd attempt]) Patient has a Dental home. Hospitalizations/ED visits: Reviewed      11/27/2022    8:09 AM 08/15/2021    8:54 AM 07/13/2020    8:12 AM 07/13/2019    8:57 AM 12/24/2018    9:40 AM  Depression screen PHQ 2/9  Decreased Interest 0 0 0 0 0  Down, Depressed, Hopeless 0 0 0 0 0  PHQ - 2 Score 0 0 0 0 0        No data to display          Immunization History  Administered Date(s) Administered   Influenza,inj,Quad PF,6+ Mos 07/28/2018, 07/13/2019, 07/13/2020   PFIZER(Purple Top)SARS-COV-2 Vaccination 01/11/2020, 02/08/2020   Td 06/14/2001   Tdap 04/27/2014, 04/20/2022   Zoster Recombinat (Shingrix) 08/09/2020, 01/30/2021   Past Medical History:  Diagnosis Date   Allergic rhinitis    Allergy    B12 deficiency    Back pain    Chicken pox    Colon polyp 2015   TUBULAR ADENOMA (X 1).   DDD (degenerative disc disease), cervical    Endometriosis of uterus    GERD (gastroesophageal reflux disease)    Hyperlipidemia    IBS (irritable bowel syndrome)    Internal hemorrhoid    Migraines    Rectal bleeding    Vitamin D deficiency    Allergies  Allergen Reactions   Codeine Itching and Nausea Only    REACTION: nausea   Gluten Meal Other (See Comments)    Stomach swells    Morphine Nausea Only    REACTION: nausea   Oxycodone-Acetaminophen Nausea And Vomiting   Prednisone     REACTION: Hyperactive   Sulfa Antibiotics Nausea Only   Past Surgical History:  Procedure Laterality Date   ABDOMINAL HYSTERECTOMY  1994   APPENDECTOMY  02/2000   and ovary   AUGMENTATION MAMMAPLASTY Bilateral 1998   Saline,retropectoral  COLONOSCOPY W/ BIOPSIES  2015   multiple   COSMETIC SURGERY  Tummy tuck   ESOPHAGOGASTRODUODENOSCOPY  2011   multiple   JOINT REPLACEMENT  2019   LAPAROSCOPY     OOPHORECTOMY Left 02/2000   w/appendectomy   PARTIAL HYSTERECTOMY  1994   right ovary and uterus   TOTAL HIP ARTHROPLASTY Right 08/2018   August 31, 2018   TUBAL LIGATION  1990   Family History  Problem Relation Age of Onset   Hyperlipidemia Mother 67   Heart failure Mother 53   Arthritis Mother    Asthma Mother    Hearing loss Mother    Heart disease Mother    Hypertension Mother    Miscarriages / [place] Mother    Colon polyps Father    Stroke Father    Hearing loss Father     Breast cancer Maternal Grandmother 62   Colon cancer Neg Hx    Esophageal cancer Neg Hx    Rectal cancer Neg Hx    Stomach cancer Neg Hx    Social History   Social History Narrative   Married. 2 children.    HS graduate. Employed as a Herbalist.    Uses bicycle helmet, smoke alarm in the home.   Feels safe in her relationships.   Caffeine- unsweet 1 quart most days.     Allergies as of 11/27/2022       Reactions   Codeine Itching, Nausea Only   REACTION: nausea   Gluten Meal Other (See Comments)   Stomach swells    Morphine Nausea Only   REACTION: nausea   Oxycodone-acetaminophen Nausea And Vomiting   Prednisone    REACTION: Hyperactive   Sulfa Antibiotics Nausea Only        Medication List        Accurate as of November 27, 2022  8:23 AM. If you have any questions, ask your nurse or doctor.          STOP taking these medications    cyanocobalamin 1000 MCG tablet Commonly known as: VITAMIN B12 Stopped by: Howard Pouch, DO   famotidine 40 MG tablet Commonly known as: PEPCID Stopped by: Howard Pouch, DO   ferrous sulfate 325 (65 FE) MG tablet Stopped by: Howard Pouch, DO   omeprazole 20 MG capsule Commonly known as: PRILOSEC Stopped by: Howard Pouch, DO       TAKE these medications    aspirin-acetaminophen-caffeine 250-250-65 MG tablet Commonly known as: EXCEDRIN MIGRAINE Take by mouth as needed for headache.   esomeprazole 20 MG capsule Commonly known as: NexIUM Take 1 capsule (20 mg total) by mouth 2 (two) times daily before a meal.   estradiol 0.5 MG tablet Commonly known as: ESTRACE Take 0.5 mg by mouth daily.   folic acid 1 MG tablet Commonly known as: FOLVITE TAKE 1 TABLET BY MOUTH EVERY DAY   iron polysaccharides 150 MG capsule Commonly known as: NIFEREX TAKE 1 CAPSULE BY MOUTH DAILY WITH BREAKFAST.   rizatriptan 10 MG disintegrating tablet Commonly known as: MAXALT-MLT Take 1 tablet (10 mg total) by mouth as needed for  migraine. May repeat in 2 hours if needed   Ubrelvy 100 MG Tabs Generic drug: Ubrogepant Take 100 mg by mouth daily as needed. Take one tablet at onset of headache, may repeat 1 tablet in 2 hours, no more than 2 tablets in 24 hours   Vitamin D (Ergocalciferol) 50 MCG (2000 UT) Caps Take 2,000 Units by mouth daily.  All past medical history, surgical history, allergies, family history, immunizations andmedications were updated in the EMR today and reviewed under the history and medication portions of their EMR.      ROS: 14 pt review of systems performed and negative (unless mentioned in an HPI)  Objective: BP 104/72   Pulse 74   Temp 98.1 F (36.7 C)   Ht 5' 4.37" (1.635 m)   Wt 145 lb 9.6 oz (66 kg)   SpO2 97%   BMI 24.71 kg/m  Physical Exam Vitals and nursing note reviewed.  Constitutional:      General: She is not in acute distress.    Appearance: Normal appearance. She is not ill-appearing or toxic-appearing.  HENT:     Head: Normocephalic and atraumatic.     Right Ear: Tympanic membrane, ear canal and external ear normal. There is no impacted cerumen.     Left Ear: Tympanic membrane, ear canal and external ear normal. There is no impacted cerumen.     Nose: No congestion or rhinorrhea.     Mouth/Throat:     Mouth: Mucous membranes are moist.     Pharynx: Oropharynx is clear. No oropharyngeal exudate or posterior oropharyngeal erythema.  Eyes:     General: No scleral icterus.       Right eye: No discharge.        Left eye: No discharge.     Extraocular Movements: Extraocular movements intact.     Conjunctiva/sclera: Conjunctivae normal.     Pupils: Pupils are equal, round, and reactive to light.  Cardiovascular:     Rate and Rhythm: Normal rate and regular rhythm.     Pulses: Normal pulses.     Heart sounds: Normal heart sounds. No murmur heard.    No friction rub. No gallop.  Pulmonary:     Effort: Pulmonary effort is normal. No respiratory distress.      Breath sounds: Normal breath sounds. No stridor. No wheezing, rhonchi or rales.  Chest:     Chest wall: No tenderness.  Abdominal:     General: Abdomen is flat. Bowel sounds are normal. There is no distension.     Palpations: Abdomen is soft. There is no mass.     Tenderness: There is no abdominal tenderness. There is no right CVA tenderness, left CVA tenderness, guarding or rebound.     Hernia: No hernia is present.  Musculoskeletal:        General: No swelling, tenderness or deformity. Normal range of motion.     Cervical back: Normal range of motion and neck supple. No rigidity or tenderness.     Right lower leg: No edema.     Left lower leg: No edema.  Lymphadenopathy:     Cervical: No cervical adenopathy.  Skin:    General: Skin is warm and dry.     Coloration: Skin is not jaundiced or pale.     Findings: No bruising, erythema, lesion or rash.  Neurological:     General: No focal deficit present.     Mental Status: She is alert and oriented to person, place, and time. Mental status is at baseline.     Cranial Nerves: No cranial nerve deficit.     Sensory: No sensory deficit.     Motor: No weakness.     Coordination: Coordination normal.     Gait: Gait normal.     Deep Tendon Reflexes: Reflexes normal.  Psychiatric:        Mood and Affect: Mood  normal.        Behavior: Behavior normal.        Thought Content: Thought content normal.        Judgment: Judgment normal.    No results found.  Assessment/plan: LINDSEY DEMONTE is a 62 y.o. female present for CPE and Chronic Conditions/illness Management  B12 deficiency Standing order for b12 q 4 weeks by nurse visit.  - b12 levels collected today  Vitamin D deficiency/osteopenia - vit d collected today Dexa 2021- performed at gyn  Mixed hyperlipidemia Diet and exercise recommended. Cbc, cmp, tsh, lipids collected today  Encounter for preventative adult health care examination Patient was encouraged to exercise  greater than 150 minutes a week. Patient was encouraged to choose a diet filled with fresh fruits and vegetables, and lean meats. AVS provided to patient today for education/recommendation on gender specific health and safety maintenance. Colonoscopy: completed 10/2018, by Dr. Fuller Plan. follow up 5 yr. Mammogram: completed: 07/14/2022, birads Dr.Holland.  Cervical cancer screening: last pap: 2020,, completed by: Dr. Matthew Saras 3-35yrImmunizations: tdap UTD 2015, Influenza declined today y (encouraged yearly), shingrix completed Infectious disease screening: HIV and Hep C completed DEXA: completed at GYN- completed 2021, (records requested again  [3rd attempt])  Return in about 1 year (around 11/29/2023) for cpe (20 min).   Orders Placed This Encounter  Procedures   Hemoglobin A1c   Lipid panel   TSH   B12   Vitamin D (25 hydroxy)   No orders of the defined types were placed in this encounter.  Referral Orders  No referral(s) requested today     Electronically signed by: RHoward Pouch [place]

## 2022-11-27 NOTE — Patient Instructions (Addendum)
Return in about 1 year (around 11/29/2023) for cpe (20 min).        Great to see you today.  I have refilled the medication(s) we provide.   If labs were collected, we will inform you of lab results once received either by echart message or telephone call.   - echart message- for normal results that have been seen by the patient already.   - telephone call: abnormal results or if patient has not viewed results in their echart.

## 2022-12-01 ENCOUNTER — Telehealth: Payer: Self-pay

## 2022-12-01 NOTE — Telephone Encounter (Signed)
Noted labs routed

## 2022-12-01 NOTE — Telephone Encounter (Signed)
Patient requesting [place] to fax most recent  lab(11/27/22) results to her GYN provider.  Please fax to Dr. Molli Posey Attn: Dr. Matthew Saras [phone]

## 2022-12-08 ENCOUNTER — Ambulatory Visit (INDEPENDENT_AMBULATORY_CARE_PROVIDER_SITE_OTHER): Payer: BC Managed Care – PPO

## 2022-12-08 ENCOUNTER — Telehealth: Payer: Self-pay | Admitting: Gastroenterology

## 2022-12-08 DIAGNOSIS — E538 Deficiency of other specified B group vitamins: Secondary | ICD-10-CM

## 2022-12-08 NOTE — Telephone Encounter (Signed)
Inbound call from patient requesting a call back to discuss Niferex. Patient stated she had to stop taking it because it makes her sick. Please advise.

## 2022-12-09 MED ORDER — CYANOCOBALAMIN 1000 MCG/ML IJ SOLN
1000.0000 ug | Freq: Once | INTRAMUSCULAR | Status: AC
  Administered 2022-12-08: 1000 ug via INTRAMUSCULAR

## 2022-12-09 NOTE — Telephone Encounter (Signed)
The pt has been advised of the recommendation to change to every other day Niferex.  She will call back to update our office in about a week.  She agrees to try this and if symptoms have not resolved she will try the Nuiron.

## 2022-12-09 NOTE — Telephone Encounter (Signed)
The pt returned call and states she was taking OTC iron but it caused her to have abd pain and nausea and she just did not feel well.  She saw Dr Fuller Plan and he sent in a prescription for Niferex.  The symptoms did resolve for a short period of time however, this past Saturday she began to have very painful lower abd pain that she says felt like a "kink" in her abd (she had the same pain about 20 years ago and was seen in the ED and was told she had a lot of gas build up) She then started to have diarrhea and the pain resolved.  She also has had nausea and abd pain since the week after starting the Niferex.  Last BM on Sunday and all other symptoms have resolved since stopping the Niferex.  She wants to know if IV iron is an option.  Dr Fuller Plan please advise

## 2022-12-09 NOTE — Progress Notes (Signed)
Pt here today for monthly B12 shot. Shot was given with no issue. Rx provided by office.

## 2022-12-09 NOTE — Telephone Encounter (Signed)
IV Fe is an option. An easier and less expensive option would be to take Niferex qod or change to NuIron 150 qod. If those are not tolerated then we can consider IV Fe.

## 2022-12-09 NOTE — Telephone Encounter (Signed)
Left message on machine to call back  

## 2022-12-10 ENCOUNTER — Encounter: Payer: Self-pay | Admitting: Family Medicine

## 2022-12-10 NOTE — Telephone Encounter (Signed)
Please advise 

## 2022-12-12 NOTE — Telephone Encounter (Signed)
Please inform patient that I have been out of the office due to illness. I would recommend patient make an appointment to be seen by [place]. Cardiologist require a referral from the PCP, because there has to be an issue/disease process present to establish with cardiology.  Otherwise, your primary care provider can obtain the screenings requested by patient..  If those screenings are abnormal, then we can refer to cardiology for further evaluation.  I would be happy to help her obtaining the screening she desires.  Please have her schedule an appointment to discuss.

## 2022-12-23 ENCOUNTER — Encounter: Payer: Self-pay | Admitting: Gastroenterology

## 2022-12-29 ENCOUNTER — Encounter: Payer: Self-pay | Admitting: Gastroenterology

## 2022-12-31 DIAGNOSIS — M5432 Sciatica, left side: Secondary | ICD-10-CM | POA: Diagnosis not present

## 2022-12-31 DIAGNOSIS — M545 Low back pain, unspecified: Secondary | ICD-10-CM | POA: Insufficient documentation

## 2022-12-31 DIAGNOSIS — G5702 Lesion of sciatic nerve, left lower limb: Secondary | ICD-10-CM | POA: Insufficient documentation

## 2023-01-05 ENCOUNTER — Ambulatory Visit (INDEPENDENT_AMBULATORY_CARE_PROVIDER_SITE_OTHER): Payer: BC Managed Care – PPO

## 2023-01-05 DIAGNOSIS — E538 Deficiency of other specified B group vitamins: Secondary | ICD-10-CM

## 2023-01-05 MED ORDER — CYANOCOBALAMIN 1000 MCG/ML IJ SOLN
1000.0000 ug | INTRAMUSCULAR | Status: AC
  Administered 2023-01-05 – 2024-01-01 (×11): 1000 ug via INTRAMUSCULAR

## 2023-01-05 MED ORDER — CYANOCOBALAMIN 1000 MCG/ML IJ SOLN: 1000.0000 ug | INTRAMUSCULAR | Status: DC

## 2023-01-05 NOTE — Progress Notes (Signed)
Pt here for monthly B12 injection per Dr. Kuneff   B12 1000mcg given IM left deltoid, and pt tolerated injection well.   Next B12 injection scheduled for 1 month     

## 2023-01-06 ENCOUNTER — Other Ambulatory Visit (INDEPENDENT_AMBULATORY_CARE_PROVIDER_SITE_OTHER): Payer: BC Managed Care – PPO

## 2023-01-06 ENCOUNTER — Other Ambulatory Visit: Payer: Self-pay

## 2023-01-06 DIAGNOSIS — D509 Iron deficiency anemia, unspecified: Secondary | ICD-10-CM

## 2023-01-06 LAB — FERRITIN: Ferritin: 12.3 ng/mL (ref 10.0–291.0)

## 2023-01-07 ENCOUNTER — Telehealth: Payer: Self-pay | Admitting: Physician Assistant

## 2023-01-07 DIAGNOSIS — T1581XA Foreign body in other and multiple parts of external eye, right eye, initial encounter: Secondary | ICD-10-CM | POA: Diagnosis not present

## 2023-01-07 NOTE — Telephone Encounter (Signed)
scheduled per 3/12 referral, pt has been called and confirmed date and time. Pt is aware of location and to arrive early for check in

## 2023-01-08 NOTE — Progress Notes (Signed)
Cardiology Office Note:    Date:  01/12/2023   ID:  Patricia Alvarez, DOB 04/04/1961, MRN [id number]  PCP:  Ma Hillock, DO   [place] Providers Cardiologist:  None     Referring MD: Molli Posey, MD   Chief Complaint  Patient presents with   Hyperlipidemia    History of Present Illness:    Patricia Alvarez is a 62 y.o. female seen at the request of Dr Raoul Pitch for evaluation of hypercholesterolemia. She has a history of HLD. History of migraines, Vit B12 and D deficiency. In general has been in good health. Did see Dr Verl Blalock > 15 years ago. Was tried on Crestor 10 mg daily but developed myalgias and this was stopped. She did have a coronary calcium score in Oct 2020 which was zero. She denies any chest pain or SOB. No palpitations. 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 She cooks and eats "country cooking" - [place] farm. She is gluten intolerant.   Past Medical History:  Diagnosis Date   Allergic rhinitis    Allergy    B12 deficiency    Back pain    Chicken pox    Colon polyp 2015   TUBULAR ADENOMA (X 1).   DDD (degenerative disc disease), cervical    Endometriosis of uterus    GERD (gastroesophageal reflux disease)    Hyperlipidemia    IBS (irritable bowel syndrome)    Internal hemorrhoid    Migraines    Rectal bleeding    Vitamin D deficiency     Past Surgical History:  Procedure Laterality Date   ABDOMINAL HYSTERECTOMY  1994   APPENDECTOMY  02/2000   and ovary   AUGMENTATION MAMMAPLASTY Bilateral 1998   Saline,retropectoral    COLONOSCOPY W/ BIOPSIES  2015   multiple   COSMETIC SURGERY  Tummy tuck   ESOPHAGOGASTRODUODENOSCOPY  2011   multiple   JOINT REPLACEMENT  2019   LAPAROSCOPY     OOPHORECTOMY Left 02/2000   w/appendectomy   PARTIAL HYSTERECTOMY  1994   right ovary and uterus   TOTAL HIP ARTHROPLASTY Right 08/2018   August 31, 2018   TUBAL LIGATION  1990    Current Medications: Current Meds  Medication Sig    aspirin-acetaminophen-caffeine (EXCEDRIN MIGRAINE) 250-250-65 MG tablet Take by mouth as needed for headache.    diclofenac (VOLTAREN) 75 MG EC tablet Take 75 mg by mouth 2 (two) times daily.   esomeprazole (NEXIUM) 20 MG capsule Take 1 capsule (20 mg total) by mouth 2 (two) times daily before a meal.   estradiol (ESTRACE) 0.5 MG tablet Take 0.5 mg by mouth daily.    folic acid (FOLVITE) 1 MG tablet TAKE 1 TABLET BY MOUTH EVERY DAY   gabapentin (NEURONTIN) 100 MG capsule Take 100 mg by mouth 3 (three) times daily.   iron polysaccharides (NIFEREX) 150 MG capsule TAKE 1 CAPSULE BY MOUTH DAILY WITH BREAKFAST.   rizatriptan (MAXALT-MLT) 10 MG disintegrating tablet Take 1 tablet (10 mg total) by mouth as needed for migraine. May repeat in 2 hours if needed   Ubrogepant (UBRELVY) 100 MG TABS Take 100 mg by mouth daily as needed. Take one tablet at onset of headache, may repeat 1 tablet in 2 hours, no more than 2 tablets in 24 hours   Vitamin D, Ergocalciferol, 2000 units CAPS Take 2,000 Units by mouth daily.   Current Facility-Administered Medications for the 01/12/23 encounter (Office Visit) with [place], Kerry Chisolm M, MD  Medication   cyanocobalamin (VITAMIN B12) injection 1,000 mcg     Allergies:   Codeine, Crestor [rosuvastatin], Gluten meal, Morphine, Oxycodone-acetaminophen, Prednisone, and Sulfa antibiotics   Social History   Socioeconomic History   Marital status: Married    Spouse name: Not on file   Number of children: 2   Years of education: 12   Highest education level: Not on file  Occupational History   Occupation: Herbalist    Employer: [employer]  Tobacco Use   Smoking status: Never   Smokeless tobacco: Never  Vaping Use   Vaping Use: Never used  Substance and Sexual Activity   Alcohol use: Never   Drug use: Never   Sexual activity: Yes    Partners: Male    Birth control/protection: Surgical  Other Topics Concern   Not on file  Social History Narrative    Married. 2 children.    HS graduate. Employed as a Herbalist.    Uses bicycle helmet, smoke alarm in the home.   Feels safe in her relationships.   Caffeine- unsweet 1 quart most days.    Social Determinants of Health   Financial Resource Strain: Not on file  Food Insecurity: Not on file  Transportation Needs: Not on file  Physical Activity: Not on file  Stress: Not on file  Social Connections: Not on file     Family History: The patient's family history includes Arthritis in her mother; Asthma in her mother; Breast cancer (age of onset: 58) in her maternal grandmother; Colon polyps in her father; Dementia in her father; Hearing loss in her father and mother; Heart disease in her mother; Heart failure (age of onset: 67) in her mother; Hyperlipidemia (age of onset: 90) in her mother; Hypertension in her mother; Miscarriages / Stillbirths in her mother; Parkinson's disease in her brother; Stroke in her father. There is no history of Colon cancer, Esophageal cancer, Rectal cancer, or Stomach cancer.  ROS:   Please see the history of present illness.     All other systems reviewed and are negative.  EKGs/Labs/Other Studies Reviewed:    The following studies were reviewed today: Coronary Calcium Score   TECHNIQUE: The patient was scanned on a [employer] scanner. Axial non-contrast 3 mm slices were carried out through the heart. The data set was analyzed on a dedicated work station and scored using the [employer].   FINDINGS: Non-cardiac: See separate report from [facility].   Ascending Aorta: Normal caliber.   Pericardium: Normal.   Coronary arteries: Normal origins.   IMPRESSION: Coronary calcium score of 0.   Eleonore Chiquito, MD     Electronically Signed   By: Eleonore Chiquito   On: 08/18/2019 18:44    Addended by Geralynn Rile, MD on 08/18/2019  6:46 PM    Study Result  Narrative & Impression  EXAM: OVER-READ INTERPRETATION  CT  CHEST   The following report is an over-read performed by radiologist Dr. Abigail Miyamoto of [facility], [place] on 08/18/2019. This over-read does not include interpretation of cardiac or coronary anatomy or pathology. The calcium score interpretation by the cardiologist is attached.   COMPARISON:  06/23/2019 chest radiograph.   FINDINGS: Vascular: Aortic atherosclerosis.   Mediastinum/Nodes: No mediastinal or definite hilar adenopathy, given limitations of unenhanced CT.   Lungs/Pleura: No imaged pleural fluid.  Clear imaged lungs.   Upper Abdomen: Normal imaged portions of the liver, spleen.   Musculoskeletal: Bilateral breast implants. No acute osseous abnormality. Mild  thoracic spondylosis.   IMPRESSION: 1.  No acute findings in the imaged extracardiac chest. 2.  Aortic and branch vessel atherosclerosis.   Electronically Signed: By: Abigail Miyamoto M.D. On: 08/18/2019 16:22         EKG:  EKG is  ordered today.  The ekg ordered today demonstrates NSR rate 66. Normal. I have personally reviewed and interpreted this study.   Recent Labs: 08/29/2022: ALT 9; BUN 9; Creatinine, Ser 0.57; Magnesium 2.1; Potassium 4.8; Sodium 138 11/03/2022: Hemoglobin 14.0; Platelets 269.0 11/27/2022: TSH 3.87  Recent Lipid Panel    Component Value Date/Time   CHOL 243 (H) 11/27/2022 0824   TRIG 163.0 (H) 11/27/2022 0824   HDL 57.00 11/27/2022 0824   CHOLHDL 4 11/27/2022 0824   VLDL 32.6 11/27/2022 0824   LDLCALC 153 (H) 11/27/2022 0824   LDLDIRECT 168.7 01/06/2008 1011     Risk Assessment/Calculations:                Physical Exam:    VS:  BP 110/72   Pulse 66   Ht 5\' 5"  (1.651 m)   Wt 147 lb 12.8 oz (67 kg)   SpO2 98%   BMI 24.60 kg/m     Wt Readings from Last 3 Encounters:  01/12/23 147 lb 12.8 oz (67 kg)  11/27/22 145 lb 9.6 oz (66 kg)  11/03/22 147 lb (66.7 kg)     GEN:  Well nourished, well developed in no acute distress HEENT: Normal NECK: No JVD; No  carotid bruits LYMPHATICS: No lymphadenopathy CARDIAC: RRR, no murmurs, rubs, gallops RESPIRATORY:  Clear to auscultation without rales, wheezing or rhonchi  ABDOMEN: Soft, non-tender, non-distended MUSCULOSKELETAL:  No edema; No deformity  SKIN: Warm and dry NEUROLOGIC:  Alert and oriented x 3 PSYCHIATRIC:  Normal affect   ASSESSMENT:    1. Aortic atherosclerosis ([place])   2. Mixed hyperlipidemia    PLAN:    In order of problems listed above:  I reviewed her coronary CT from 2020. She has a calcium score of 0 but does have mild aortic calcification at the arch. Given this I would argue that she needs some cholesterol lowering therapy. I have recommended a Whole food plant based diet. Continue to remain active as arthritis allows. Will try lipitor 5 mg daily to see if she can tolerate low dose statin. Repeat fasting lipids and LFTs in 3 months. If unable to tolerate would consider repeating coronary calcium score to help decide if she does need more aggressive therapy.  Hyperlipidemia. 0 calcium score in 2020 is reassuring but will see how she responds to #1           Medication Adjustments/Labs and Tests Ordered: Current medicines are reviewed at length with the patient today.  Concerns regarding medicines are outlined above.  No orders of the defined types were placed in this encounter.  No orders of the defined types were placed in this encounter.   Patient Instructions  Medication Instructions:  Start Atorvastatin 5 mg daily Continue all other medications *If you need a refill on your cardiac medications before your next appointment, please call your pharmacy*   Lab Work: Lipid and Hepatic panels in 3 months  Lab order enclosed   Testing/Procedures: None ordered   Follow-Up: At [facility], you and your health needs are our priority.  As part of our continuing mission to provide you with exceptional heart care, we have created designated Provider Care  Teams.  These Care Teams include  your primary Cardiologist (physician) and Advanced Practice Providers (APPs -  Physician Assistants and Nurse Practitioners) who all work together to provide you with the care you need, when you need it.  We recommend signing up for the patient portal called "MyChart".  Sign up information is provided on this After Visit Summary.  MyChart is used to connect with patients for Virtual Visits (Telemedicine).  Patients are able to view lab/test results, encounter notes, upcoming appointments, etc.  Non-urgent messages can be sent to your provider as well.   To learn more about what you can do with MyChart, go to [email or web address].    Your next appointment:  1 year   Call in Dec to schedule March appointment     Provider:  Dr.Jonavon Trieu     Signed, Tammala Weider [place], MD  01/12/2023 11:10 AM    [place]

## 2023-01-12 ENCOUNTER — Encounter: Payer: Self-pay | Admitting: Cardiology

## 2023-01-12 ENCOUNTER — Ambulatory Visit: Payer: BC Managed Care – PPO | Attending: Cardiology | Admitting: Cardiology

## 2023-01-12 VITALS — BP 110/72 | HR 66 | Ht 65.0 in | Wt 147.8 lb

## 2023-01-12 DIAGNOSIS — I700 Atherosclerosis of aorta: Secondary | ICD-10-CM

## 2023-01-12 DIAGNOSIS — E782 Mixed hyperlipidemia: Secondary | ICD-10-CM | POA: Diagnosis not present

## 2023-01-12 MED ORDER — ATORVASTATIN CALCIUM 10 MG PO TABS: 5.0000 mg | ORAL_TABLET | Freq: Every day | ORAL | 3 refills | Status: DC

## 2023-01-12 NOTE — Patient Instructions (Signed)
Medication Instructions:  Start Atorvastatin 5 mg daily Continue all other medications *If you need a refill on your cardiac medications before your next appointment, please call your pharmacy*   Lab Work: Lipid and Hepatic panels in 3 months  Lab order enclosed   Testing/Procedures: None ordered   Follow-Up: At [facility], you and your health needs are our priority.  As part of our continuing mission to provide you with exceptional heart care, we have created designated Provider Care Teams.  These Care Teams include your primary Cardiologist (physician) and Advanced Practice Providers (APPs -  Physician Assistants and Nurse Practitioners) who all work together to provide you with the care you need, when you need it.  We recommend signing up for the patient portal called "MyChart".  Sign up information is provided on this After Visit Summary.  MyChart is used to connect with patients for Virtual Visits (Telemedicine).  Patients are able to view lab/test results, encounter notes, upcoming appointments, etc.  Non-urgent messages can be sent to your provider as well.   To learn more about what you can do with MyChart, go to [email or web address].    Your next appointment:  1 year   Call in Dec to schedule March appointment     Provider:  Dr.Jordan

## 2023-01-12 NOTE — Addendum Note (Signed)
Addended by: Kathyrn Lass on: 01/12/2023 11:16 AM   Modules accepted: Orders

## 2023-01-15 DIAGNOSIS — M7062 Trochanteric bursitis, left hip: Secondary | ICD-10-CM | POA: Diagnosis not present

## 2023-01-15 DIAGNOSIS — M5432 Sciatica, left side: Secondary | ICD-10-CM | POA: Diagnosis not present

## 2023-01-23 ENCOUNTER — Telehealth: Payer: Self-pay | Admitting: Physician Assistant

## 2023-01-23 NOTE — Telephone Encounter (Signed)
Patient called to confirm appointment.

## 2023-01-24 NOTE — Progress Notes (Unsigned)
[place] [phone]   [phone]  CONSULT NOTE  REFERRING PHYSICIAN: Dr. Fuller Plan  REASON FOR CONSULTATION:  Iron Deficiency Anemia  HPI Patricia Alvarez is a 62 y.o. female with past medical history significant for migraines, reflux, degenerative disc disease, COPD, B12 deficiency, and hyperlipidemia is referred to clinic for iron deficiency anemia.  The patient follows closely with Dr. Lorenza Chick from gastroenterology.  He last performed a EGD on 09/22/2022 given her history of reflux and iron deficiency grade 1 esophagitis without any evidence of bleeding small hiatal hernia and otherwise normal exam.  The patient's last colonoscopy was on 11/05/2018 the colonoscopy was normal except for grade 1 internal hemorrhoids without bleeding  The patient had iron studies performed on 11/03/2022 in which her TIBC was normal at 4 1, low end of normal ferritin at 14.3, saturation 33.3 and iron 134.  She had her iron/ferritin rechecked 2 months later on 01/06/2023 which showed persistently low end of normal ferritin at 12.3 which was not applicable despite taking iron supplements.  She was referred to the clinic for consideration of IV iron.   How long on iron?  To the patient's knowledge, she has had anemia for ***. Never had anemia. Just iron def ***       She is never required a blood transfusion or an iron infusion.  She denies any other known vitamin deficiencies ewxcept she receives monthly 12 through her PCPs office.    Regarding her symptoms, she reports fatigue, dyspnea on exertion, cold intolerance, and craving ice chips***.  She denies any significant abnormal bleeding or bruising.  Denies any gingival bleeding, hemoptysis, hematemesis, melena, or hematochezia.  Denies any history of chronic kidney disease.  She reports she eats red meat ***.  She denies any history of bariatric surgeries. Blood thinners. NSAIDs.    Patient has heavy menstrual cycles which are  every *** weeks lasting *** days.    HPI  Past Medical History:  Diagnosis Date   Allergic rhinitis    Allergy    B12 deficiency    Back pain    Chicken pox    Colon polyp 2015   TUBULAR ADENOMA (X 1).   DDD (degenerative disc disease), cervical    Endometriosis of uterus    GERD (gastroesophageal reflux disease)    Hyperlipidemia    IBS (irritable bowel syndrome)    Internal hemorrhoid    Migraines    Rectal bleeding    Vitamin D deficiency     Past Surgical History:  Procedure Laterality Date   ABDOMINAL HYSTERECTOMY  1994   APPENDECTOMY  02/2000   and ovary   AUGMENTATION MAMMAPLASTY Bilateral 1998   Saline,retropectoral    COLONOSCOPY W/ BIOPSIES  2015   multiple   COSMETIC SURGERY  Tummy tuck   ESOPHAGOGASTRODUODENOSCOPY  2011   multiple   JOINT REPLACEMENT  2019   LAPAROSCOPY     OOPHORECTOMY Left 02/2000   w/appendectomy   PARTIAL HYSTERECTOMY  1994   right ovary and uterus   TOTAL HIP ARTHROPLASTY Right 08/2018   August 31, 2018   TUBAL LIGATION  1990    Family History  Problem Relation Age of Onset   Hyperlipidemia Mother 68   Heart failure Mother 37   Arthritis Mother    Asthma Mother    Hearing loss Mother    Heart disease Mother    Hypertension Mother    Miscarriages / [place] Mother    Colon polyps  Father    Stroke Father    Hearing loss Father    Dementia Father    Parkinson's disease Brother    Breast cancer Maternal Grandmother 65   Colon cancer Neg Hx    Esophageal cancer Neg Hx    Rectal cancer Neg Hx    Stomach cancer Neg Hx     Social History Social History   Tobacco Use   Smoking status: Never   Smokeless tobacco: Never  Vaping Use   Vaping Use: Never used  Substance Use Topics   Alcohol use: Never   Drug use: Never    Allergies  Allergen Reactions   Codeine Itching and Nausea Only    REACTION: nausea   Crestor [Rosuvastatin] Other (See Comments)    Myalgias    Gluten Meal Other (See Comments)     Stomach swells    Morphine Nausea Only    REACTION: nausea   Oxycodone-Acetaminophen Nausea And Vomiting   Prednisone     REACTION: Hyperactive   Sulfa Antibiotics Nausea Only    Current Outpatient Medications  Medication Sig Dispense Refill   aspirin-acetaminophen-caffeine (EXCEDRIN MIGRAINE) 250-250-65 MG tablet Take by mouth as needed for headache.      atorvastatin (LIPITOR) 10 MG tablet Take 0.5 tablets (5 mg total) by mouth daily. 45 tablet 3   diclofenac (VOLTAREN) 75 MG EC tablet Take 75 mg by mouth 2 (two) times daily.     esomeprazole (NEXIUM) 20 MG capsule Take 1 capsule (20 mg total) by mouth 2 (two) times daily before a meal. 180 capsule 3   estradiol (ESTRACE) 0.5 MG tablet Take 0.5 mg by mouth daily.   3   folic acid (FOLVITE) 1 MG tablet TAKE 1 TABLET BY MOUTH EVERY DAY 90 tablet 1   gabapentin (NEURONTIN) 100 MG capsule Take 100 mg by mouth 3 (three) times daily.     iron polysaccharides (NIFEREX) 150 MG capsule TAKE 1 CAPSULE BY MOUTH DAILY WITH BREAKFAST. 90 capsule 0   rizatriptan (MAXALT-MLT) 10 MG disintegrating tablet Take 1 tablet (10 mg total) by mouth as needed for migraine. May repeat in 2 hours if needed 9 tablet 12   Ubrogepant (UBRELVY) 100 MG TABS Take 100 mg by mouth daily as needed. Take one tablet at onset of headache, may repeat 1 tablet in 2 hours, no more than 2 tablets in 24 hours 10 tablet 11   Vitamin D, Ergocalciferol, 2000 units CAPS Take 2,000 Units by mouth daily.     Current Facility-Administered Medications  Medication Dose Route Frequency Provider Last Rate Last Admin   cyanocobalamin (VITAMIN B12) injection 1,000 mcg  1,000 mcg Intramuscular Q30 days Kuneff, Renee A, DO   1,000 mcg at 01/05/23 0940    REVIEW OF SYSTEMS:   Review of Systems  Constitutional: Negative for appetite change, chills, fatigue, fever and unexpected weight change.  HENT:   Negative for mouth sores, nosebleeds, sore throat and trouble swallowing.   Eyes: Negative  for eye problems and icterus.  Respiratory: Negative for cough, hemoptysis, shortness of breath and wheezing.   Cardiovascular: Negative for chest pain and leg swelling.  Gastrointestinal: Negative for abdominal pain, constipation, diarrhea, nausea and vomiting.  Genitourinary: Negative for bladder incontinence, difficulty urinating, dysuria, frequency and hematuria.   Musculoskeletal: Negative for back pain, gait problem, neck pain and neck stiffness.  Skin: Negative for itching and rash.  Neurological: Negative for dizziness, extremity weakness, gait problem, headaches, light-headedness and seizures.  Hematological: Negative for adenopathy. Does  not bruise/bleed easily.  Psychiatric/Behavioral: Negative for confusion, depression and sleep disturbance. The patient is not nervous/anxious.     PHYSICAL EXAMINATION:  There were no vitals taken for this visit.  ECOG PERFORMANCE STATUS: {CHL ONC ECOG [id number]  Physical Exam  Constitutional: Oriented to person, place, and time and well-developed, well-nourished, and in no distress. No distress.  HENT:  Head: Normocephalic and atraumatic.  Mouth/Throat: Oropharynx is clear and moist. No oropharyngeal exudate.  Eyes: Conjunctivae are normal. Right eye exhibits no discharge. Left eye exhibits no discharge. No scleral icterus.  Neck: Normal range of motion. Neck supple.  Cardiovascular: Normal rate, regular rhythm, normal heart sounds and intact distal pulses.   Pulmonary/Chest: Effort normal and breath sounds normal. No respiratory distress. No wheezes. No rales.  Abdominal: Soft. Bowel sounds are normal. Exhibits no distension and no mass. There is no tenderness.  Musculoskeletal: Normal range of motion. Exhibits no edema.  Lymphadenopathy:    No cervical adenopathy.  Neurological: Alert and oriented to person, place, and time. Exhibits normal muscle tone. Gait normal. Coordination normal.  Skin: Skin is warm and dry. No rash noted. Not  diaphoretic. No erythema. No pallor.  Psychiatric: Mood, memory and judgment normal.  Vitals reviewed.  LABORATORY DATA: Lab Results  Component Value Date   WBC 5.1 11/03/2022   HGB 14.0 11/03/2022   HCT 40.9 11/03/2022   MCV 92.9 11/03/2022   PLT 269.0 11/03/2022      Chemistry      Component Value Date/Time   NA 138 08/29/2022 1157   NA 143 05/25/2019 0000   K 4.8 08/29/2022 1157   CL 103 08/29/2022 1157   CO2 31 08/29/2022 1157   BUN 9 08/29/2022 1157   BUN 11 05/25/2019 0000   CREATININE 0.57 08/29/2022 1157      Component Value Date/Time   CALCIUM 9.4 08/29/2022 1157   ALKPHOS 65 08/29/2022 1157   AST 19 08/29/2022 1157   ALT 9 08/29/2022 1157   BILITOT 0.4 08/29/2022 1157       RADIOGRAPHIC STUDIES: No results found.  ASSESSMENT: This is a very pleasant 62 year old Caucasian female referred to the clinic for iron deficiency without anemia.  The patient had several lab studies performed today including a CBC, iron studies, ferritin, B12, folate, and SPEP.  Her labs today show ***  We will consider her for IV iron infusions if she continues to have low ferritin despite taking an iron supplement.  She was instructed to continue taking her iron supplement with vitamin C.  She was also given a handout of iron rich food and was strongly encouraged to increase her dietary intake of iron.  Will see her back for lab and follow-up visit in approximately 4 months.   The patient voices understanding of current disease status and treatment options and is in agreement with the current care plan.  All questions were answered. The patient knows to call the clinic with any problems, questions or concerns. We can certainly see the patient much sooner if necessary.  Thank you so much for allowing me to participate in the care of [name]. I will continue to follow up the patient with you and assist in her care.  I spent {CHL ONC TIME VISIT - [id number]  counseling the patient face to face. The total time spent in the appointment was {CHL ONC TIME VISIT - [id number].  Disclaimer: This note was dictated with voice recognition software. Similar sounding words can inadvertently be transcribed and  may not be corrected upon review.   Deacon Gadbois L Cande Mastropietro January 24, 2023, 9:30 AM

## 2023-01-26 ENCOUNTER — Other Ambulatory Visit: Payer: Self-pay | Admitting: Physician Assistant

## 2023-01-26 DIAGNOSIS — D509 Iron deficiency anemia, unspecified: Secondary | ICD-10-CM

## 2023-01-27 ENCOUNTER — Inpatient Hospital Stay: Payer: BC Managed Care – PPO

## 2023-01-27 ENCOUNTER — Inpatient Hospital Stay: Payer: BC Managed Care – PPO | Attending: Physician Assistant | Admitting: Physician Assistant

## 2023-01-27 ENCOUNTER — Other Ambulatory Visit: Payer: Self-pay

## 2023-01-27 VITALS — BP 112/79 | HR 75 | Temp 97.8°F | Resp 16 | Ht 65.0 in | Wt 145.4 lb

## 2023-01-27 DIAGNOSIS — E538 Deficiency of other specified B group vitamins: Secondary | ICD-10-CM | POA: Insufficient documentation

## 2023-01-27 DIAGNOSIS — E611 Iron deficiency: Secondary | ICD-10-CM | POA: Diagnosis not present

## 2023-01-27 DIAGNOSIS — D509 Iron deficiency anemia, unspecified: Secondary | ICD-10-CM

## 2023-01-27 LAB — CMP (CANCER CENTER ONLY)
ALT: 11 U/L (ref 0–44)
AST: 18 U/L (ref 15–41)
Albumin: 4.3 g/dL (ref 3.5–5.0)
Alkaline Phosphatase: 61 U/L (ref 38–126)
Anion gap: 8 (ref 5–15)
BUN: 12 mg/dL (ref 6–20)
CO2: 28 mmol/L (ref 22–32)
Calcium: 9.5 mg/dL (ref 8.9–10.3)
Chloride: 102 mmol/L (ref 98–111)
Creatinine: 0.7 mg/dL (ref 0.44–1.00)
GFR, Estimated: >60 mL/min (ref >60)
Glucose, Bld: 99 mg/dL (ref 70–99)
Potassium: 3.7 mmol/L (ref 3.5–5.1)
Sodium: 138 mmol/L (ref 135–145)
Total Bilirubin: 0.4 mg/dL (ref 0.3–1.2)
Total Protein: 6.9 g/dL (ref 6.5–8.1)

## 2023-01-27 LAB — CBC WITH DIFFERENTIAL (CANCER CENTER ONLY)
Abs Immature Granulocytes: 0.02 10*3/uL (ref 0.00–0.07)
Basophils Absolute: 0 10*3/uL (ref 0.0–0.1)
Basophils Relative: 1 %
Eosinophils Absolute: 0.1 10*3/uL (ref 0.0–0.5)
Eosinophils Relative: 2 %
HCT: 38.3 % (ref 36.0–46.0)
Hemoglobin: 13.3 g/dL (ref 12.0–15.0)
Immature Granulocytes: 0 %
Lymphocytes Relative: 30 %
Lymphs Abs: 2.1 10*3/uL (ref 0.7–4.0)
MCH: 32.2 pg (ref 26.0–34.0)
MCHC: 34.7 g/dL (ref 30.0–36.0)
MCV: 92.7 fL (ref 80.0–100.0)
Monocytes Absolute: 0.4 10*3/uL (ref 0.1–1.0)
Monocytes Relative: 5 %
Neutro Abs: 4.2 10*3/uL (ref 1.7–7.7)
Neutrophils Relative %: 62 %
Platelet Count: 299 10*3/uL (ref 150–400)
RBC: 4.13 MIL/uL (ref 3.87–5.11)
RDW: 13 % (ref 11.5–15.5)
WBC Count: 6.9 10*3/uL (ref 4.0–10.5)
nRBC: 0 % (ref 0.0–0.2)

## 2023-01-27 LAB — VITAMIN B12: Vitamin B-12: 711 pg/mL (ref 180–914)

## 2023-01-27 LAB — IRON AND IRON BINDING CAPACITY (CC-WL,HP ONLY)
Iron: 136 ug/dL (ref 28–170)
Saturation Ratios: 31 % (ref 10.4–31.8)
TIBC: 435 ug/dL (ref 250–450)
UIBC: 299 ug/dL (ref 148–442)

## 2023-01-27 LAB — FERRITIN: Ferritin: 19 ng/mL (ref 11–307)

## 2023-01-27 LAB — FOLATE: Folate: 22.6 ng/mL (ref >5.9)

## 2023-01-30 DIAGNOSIS — M7062 Trochanteric bursitis, left hip: Secondary | ICD-10-CM | POA: Insufficient documentation

## 2023-02-02 ENCOUNTER — Ambulatory Visit: Payer: BC Managed Care – PPO

## 2023-02-02 ENCOUNTER — Ambulatory Visit (INDEPENDENT_AMBULATORY_CARE_PROVIDER_SITE_OTHER): Payer: BC Managed Care – PPO

## 2023-02-02 DIAGNOSIS — E538 Deficiency of other specified B group vitamins: Secondary | ICD-10-CM | POA: Diagnosis not present

## 2023-02-02 NOTE — Progress Notes (Signed)
Pt here today for monthly B12 shot. Shot was given with no issue. Rx provided by office.   

## 2023-02-14 ENCOUNTER — Other Ambulatory Visit: Payer: Self-pay | Admitting: Gastroenterology

## 2023-03-04 ENCOUNTER — Ambulatory Visit (INDEPENDENT_AMBULATORY_CARE_PROVIDER_SITE_OTHER): Payer: BC Managed Care – PPO

## 2023-03-04 DIAGNOSIS — E538 Deficiency of other specified B group vitamins: Secondary | ICD-10-CM | POA: Diagnosis not present

## 2023-03-04 NOTE — Progress Notes (Signed)
Pt here for monthly B12 injection per    B12 1000mcg given IM, and pt tolerated injection well.   Next B12 injection scheduled for 1 month 

## 2023-03-24 DIAGNOSIS — H04121 Dry eye syndrome of right lacrimal gland: Secondary | ICD-10-CM | POA: Diagnosis not present

## 2023-03-27 ENCOUNTER — Encounter: Payer: Self-pay | Admitting: Cardiology

## 2023-04-01 ENCOUNTER — Ambulatory Visit: Payer: BC Managed Care – PPO

## 2023-04-02 ENCOUNTER — Ambulatory Visit (INDEPENDENT_AMBULATORY_CARE_PROVIDER_SITE_OTHER): Payer: BC Managed Care – PPO

## 2023-04-02 DIAGNOSIS — E538 Deficiency of other specified B group vitamins: Secondary | ICD-10-CM

## 2023-04-02 NOTE — Progress Notes (Signed)
Pt here for monthly B12 injection per Dr.Kuneff   B12 1000mcg given IM, and pt tolerated injection well.   Next B12 injection scheduled for 1 month.       

## 2023-04-20 DIAGNOSIS — I700 Atherosclerosis of aorta: Secondary | ICD-10-CM | POA: Diagnosis not present

## 2023-04-20 DIAGNOSIS — E782 Mixed hyperlipidemia: Secondary | ICD-10-CM | POA: Diagnosis not present

## 2023-04-20 NOTE — Progress Notes (Signed)
[facility] OFFICE PROGRESS NOTE  Patricia Alvarez, [place] [address] [place] [address]  DIAGNOSIS: Iron Deficiency anemia   PRIOR THERAPY: None   CURRENT THERAPY: Oral iron supplements with every other day  p.o. daily.   INTERVAL HISTORY: Patricia Alvarez 62 y.o. female returns to the clinic today for a follow-up visit.  The patient was first seen in the clinic on 01/27/2023 to establish care for history of iron deficiency anemia.  At that time, her CBC and CMP were within normal limits.  Due to no significant iron deficiency, we recommended that she continue on oral iron supplements.  If she had worsening anemia then we would consider her for IV iron. Since last being seen, she denies any major changes in her health except she still struggles with migraines and headaches.  She is scheduled to see her neurologist next week on 7/12.  He has had headaches recently for the last few weeks.    She continues to be compliant with her over-the-counter iron supplement every other day. She is currently taking iron polysaccharide every other day for tolerability.  She also has a history of folate and B12 deficiency for which she takes folic acid and receives monthly B12 injections through her PCPs office.   She follows with Dr. Russella Dar from gastroenterology.  She had an EGD performed on 09/22/2022 that shows grade 1 esophagitis without any evidence of bleeding and small hiatal hernia.  Her last colonoscopy was in January 2020 which was normal except for grade 1 internal hemorrhoids without bleeding.  She denies any dietary restrictions although she does prefer chicken and fish.  Denies any abnormal bleeding or bruising including epistaxis, gingival bleeding, hemoptysis, hematemesis, melena, or hematochezia.  Denies any lightheadedness or craving of ice chips.  She sometimes may have dyspnea on exertion and lack of energy.  She is here today for evaluation and repeat blood work.      MEDICAL  HISTORY: Past Medical History:  Diagnosis Date   Allergic rhinitis    Allergy    B12 deficiency    Back pain    Chicken pox    Colon polyp 2015   TUBULAR ADENOMA (X 1).   DDD (degenerative disc disease), cervical    Endometriosis of uterus    GERD (gastroesophageal reflux disease)    Hyperlipidemia    IBS (irritable bowel syndrome)    Internal hemorrhoid    Migraines    Rectal bleeding    Vitamin D deficiency     ALLERGIES:  is allergic to codeine, crestor [rosuvastatin], gluten meal, morphine, oxycodone-acetaminophen, prednisone, and sulfa antibiotics.  MEDICATIONS:  Current Outpatient Medications  Medication Sig Dispense Refill   aspirin-acetaminophen-caffeine (EXCEDRIN MIGRAINE) 250-250-65 MG tablet Take by mouth as needed for headache.      atorvastatin (LIPITOR) 10 MG tablet Take 0.5 tablets (5 mg total) by mouth daily. 45 tablet 3   diclofenac (VOLTAREN) 75 MG EC tablet Take 75 mg by mouth 2 (two) times daily.     esomeprazole (NEXIUM) 20 MG capsule Take 1 capsule (20 mg total) by mouth 2 (two) times daily before a meal. 180 capsule 3   estradiol (ESTRACE) 0.5 MG tablet Take 0.5 mg by mouth daily.   3   folic acid (FOLVITE) 1 MG tablet TAKE 1 TABLET BY MOUTH EVERY DAY 90 tablet 1   gabapentin (NEURONTIN) 100 MG capsule Take 100 mg by mouth 3 (three) times daily.     iron polysaccharides (NIFEREX)  150 MG capsule TAKE 1 CAPSULE BY MOUTH DAILY WITH BREAKFAST. 90 capsule 0   rizatriptan (MAXALT-MLT) 10 MG disintegrating tablet Take 1 tablet (10 mg total) by mouth as needed for migraine. May repeat in 2 hours if needed 9 tablet 12   Ubrogepant (UBRELVY) 100 MG TABS Take 100 mg by mouth daily as needed. Take one tablet at onset of headache, may repeat 1 tablet in 2 hours, no more than 2 tablets in 24 hours 10 tablet 11   Vitamin D, Ergocalciferol, 2000 units CAPS Take 2,000 Units by mouth daily.     Current Facility-Administered Medications  Medication Dose Route Frequency  Provider Last Rate Last Admin   cyanocobalamin (VITAMIN B12) injection 1,000 mcg  1,000 mcg Intramuscular Q30 days Kuneff, Renee A, DO   1,000 mcg at 04/02/23 0858    SURGICAL HISTORY:  Past Surgical History:  Procedure Laterality Date   ABDOMINAL HYSTERECTOMY  1994   APPENDECTOMY  02/2000   and ovary   AUGMENTATION MAMMAPLASTY Bilateral 1998   Saline,retropectoral    COLONOSCOPY W/ BIOPSIES  2015   multiple   COSMETIC SURGERY  Tummy tuck   ESOPHAGOGASTRODUODENOSCOPY  2011   multiple   JOINT REPLACEMENT  2019   LAPAROSCOPY     OOPHORECTOMY Left 02/2000   w/appendectomy   PARTIAL HYSTERECTOMY  1994   right ovary and uterus   TOTAL HIP ARTHROPLASTY Right 08/2018   August 31, 2018   TUBAL LIGATION  1990    REVIEW OF SYSTEMS:   Review of Systems  Constitutional: Positive for fatigue.  Negative for appetite change, chills, fever and unexpected weight change.  HENT: Negative for mouth sores, nosebleeds, sore throat and trouble swallowing.   Eyes: Negative for eye problems and icterus.  Respiratory: Positive for occasional dyspnea on exertion.  Negative for cough, hemoptysis, and wheezing.   Cardiovascular: Negative for chest pain and leg swelling.  Gastrointestinal: Negative for abdominal pain, constipation, diarrhea, nausea and vomiting.  Genitourinary: Negative for bladder incontinence, difficulty urinating, dysuria, frequency and hematuria.   Musculoskeletal: Negative for back pain, gait problem, neck pain and neck stiffness.  Skin: Negative for itching and rash.  Neurological: Positive for headaches.  Negative for dizziness, extremity weakness, gait problem, light-headedness and seizures.  Hematological: Negative for adenopathy. Does not bruise/bleed easily.  Psychiatric/Behavioral: Negative for confusion, depression and sleep disturbance. The patient is not nervous/anxious.     PHYSICAL EXAMINATION:  There were no vitals taken for this visit.  ECOG PERFORMANCE STATUS:  0  Physical Exam  Constitutional: Oriented to person, place, and time and well-developed, well-nourished, and in no distress.  HENT:  Head: Normocephalic and atraumatic.  Mouth/Throat: Oropharynx is clear and moist. No oropharyngeal exudate.  Eyes: Conjunctivae are normal. Right eye exhibits no discharge. Left eye exhibits no discharge. No scleral icterus.  Neck: Normal range of motion. Neck supple.  Cardiovascular: Normal rate, regular rhythm, normal heart sounds and intact distal pulses.   Pulmonary/Chest: Effort normal and breath sounds normal. No respiratory distress. No wheezes. No rales.  Abdominal: Soft. Bowel sounds are normal. Exhibits no distension and no mass. There is no tenderness.  Musculoskeletal: Normal range of motion. Exhibits no edema.  Lymphadenopathy:    No cervical adenopathy.  Neurological: Alert and oriented to person, place, and time. Exhibits normal muscle tone. Gait normal. Coordination normal.  Skin: Skin is warm and dry. No rash noted. Not diaphoretic. No erythema. No pallor.  Psychiatric: Mood, memory and judgment normal.  Vitals reviewed.  LABORATORY  DATA: Lab Results  Component Value Date   WBC 6.9 01/27/2023   HGB 13.3 01/27/2023   HCT 38.3 01/27/2023   MCV 92.7 01/27/2023   PLT 299 01/27/2023      Chemistry      Component Value Date/Time   NA 138 01/27/2023 1303   NA 143 05/25/2019 0000   K 3.7 01/27/2023 1303   CL 102 01/27/2023 1303   CO2 28 01/27/2023 1303   BUN 12 01/27/2023 1303   BUN 11 05/25/2019 0000   CREATININE 0.70 01/27/2023 1303      Component Value Date/Time   CALCIUM 9.5 01/27/2023 1303   ALKPHOS 61 01/27/2023 1303   AST 18 01/27/2023 1303   ALT 11 01/27/2023 1303   BILITOT 0.4 01/27/2023 1303       RADIOGRAPHIC STUDIES:  No results found.   ASSESSMENT/PLAN:  This is a very pleasant 62 year old Caucasian female referred to the clinic for iron deficiency without anemia.   She is currently taking oral iron  supplements with p.o. daily every other day.  Labs were reviewed. Her Hbg is normal at 13.1. Her iron studies are pending. Unless she is significantly low ferritin, she likely will not need any additional IV iron at this time.  However, I will send her a MyChart message tomorrow once I have the results of her ferritin and iron studies.  Recommend labs and follow-up visit in 3 months.  If she has worsening anemia or starts developing more significant iron deficiency, then he would be happy to arrange for IV iron at that time.   For now she will continue taking her prescription iron supplement every other day. She is taking this with vitamin C   She will continue receiving B12 injections and take folic acid supplements as prescribed by her PCP   She is scheduled to see her neurologist next week for her headaches  The patient was advised to call immediately if she has any concerning symptoms in the interval. The patient voices understanding of current disease status and treatment options and is in agreement with the current care plan. All questions were answered. The patient knows to call the clinic with any problems, questions or concerns. We can certainly see the patient much sooner if necessary  No orders of the defined types were placed in this encounter.    The total time spent in the appointment was 20-29 minutes  Shanitha Twining L Naysha Sholl, PA-C 04/20/23

## 2023-04-21 LAB — LIPID PANEL
Chol/HDL Ratio: 3.1 ratio (ref 0.0–4.4)
Cholesterol, Total: 164 mg/dL (ref 100–199)
HDL: 53 mg/dL (ref >39)
LDL Chol Calc (NIH): 90 mg/dL (ref 0–99)
Triglycerides: 118 mg/dL (ref 0–149)
VLDL Cholesterol Cal: 21 mg/dL (ref 5–40)

## 2023-04-21 LAB — HEPATIC FUNCTION PANEL
ALT: 12 IU/L (ref 0–32)
AST: 23 IU/L (ref 0–40)
Albumin: 4.2 g/dL (ref 3.8–4.9)
Alkaline Phosphatase: 90 IU/L (ref 44–121)
Bilirubin Total: 0.2 mg/dL (ref 0.0–1.2)
Bilirubin, Direct: <0.1 mg/dL (ref 0.00–0.40)
Total Protein: 6.2 g/dL (ref 6.0–8.5)

## 2023-04-27 ENCOUNTER — Inpatient Hospital Stay: Payer: BC Managed Care – PPO | Attending: Physician Assistant

## 2023-04-27 ENCOUNTER — Inpatient Hospital Stay (HOSPITAL_BASED_OUTPATIENT_CLINIC_OR_DEPARTMENT_OTHER): Payer: BC Managed Care – PPO | Admitting: Physician Assistant

## 2023-04-27 ENCOUNTER — Other Ambulatory Visit: Payer: Self-pay

## 2023-04-27 VITALS — BP 105/69 | HR 78 | Temp 98.4°F | Resp 16 | Ht 65.0 in | Wt 146.3 lb

## 2023-04-27 DIAGNOSIS — D509 Iron deficiency anemia, unspecified: Secondary | ICD-10-CM | POA: Insufficient documentation

## 2023-04-27 DIAGNOSIS — E611 Iron deficiency: Secondary | ICD-10-CM

## 2023-04-27 DIAGNOSIS — E538 Deficiency of other specified B group vitamins: Secondary | ICD-10-CM | POA: Diagnosis not present

## 2023-04-27 DIAGNOSIS — G4489 Other headache syndrome: Secondary | ICD-10-CM | POA: Insufficient documentation

## 2023-04-27 DIAGNOSIS — G43909 Migraine, unspecified, not intractable, without status migrainosus: Secondary | ICD-10-CM | POA: Insufficient documentation

## 2023-04-27 LAB — CBC WITH DIFFERENTIAL (CANCER CENTER ONLY)
Abs Immature Granulocytes: 0.01 10*3/uL (ref 0.00–0.07)
Basophils Absolute: 0 10*3/uL (ref 0.0–0.1)
Basophils Relative: 1 %
Eosinophils Absolute: 0.2 10*3/uL (ref 0.0–0.5)
Eosinophils Relative: 4 %
HCT: 39 % (ref 36.0–46.0)
Hemoglobin: 13.1 g/dL (ref 12.0–15.0)
Immature Granulocytes: 0 %
Lymphocytes Relative: 36 %
Lymphs Abs: 2 10*3/uL (ref 0.7–4.0)
MCH: 32 pg (ref 26.0–34.0)
MCHC: 33.6 g/dL (ref 30.0–36.0)
MCV: 95.4 fL (ref 80.0–100.0)
Monocytes Absolute: 0.3 10*3/uL (ref 0.1–1.0)
Monocytes Relative: 6 %
Neutro Abs: 3 10*3/uL (ref 1.7–7.7)
Neutrophils Relative %: 53 %
Platelet Count: 275 10*3/uL (ref 150–400)
RBC: 4.09 MIL/uL (ref 3.87–5.11)
RDW: 13 % (ref 11.5–15.5)
WBC Count: 5.6 10*3/uL (ref 4.0–10.5)
nRBC: 0 % (ref 0.0–0.2)

## 2023-04-27 LAB — IRON AND IRON BINDING CAPACITY (CC-WL,HP ONLY)
Iron: 155 ug/dL (ref 28–170)
Saturation Ratios: 38 % — ABNORMAL HIGH (ref 10.4–31.8)
TIBC: 412 ug/dL (ref 250–450)
UIBC: 257 ug/dL (ref 148–442)

## 2023-04-28 ENCOUNTER — Encounter: Payer: Self-pay | Admitting: Physician Assistant

## 2023-04-28 LAB — FERRITIN: Ferritin: 11 ng/mL (ref 11–307)

## 2023-04-29 ENCOUNTER — Ambulatory Visit (INDEPENDENT_AMBULATORY_CARE_PROVIDER_SITE_OTHER): Payer: BC Managed Care – PPO

## 2023-04-29 DIAGNOSIS — E538 Deficiency of other specified B group vitamins: Secondary | ICD-10-CM | POA: Diagnosis not present

## 2023-04-29 MED ORDER — CYANOCOBALAMIN 1000 MCG/ML IJ SOLN
1000.0000 ug | Freq: Once | INTRAMUSCULAR | Status: AC
Start: 2023-04-29 — End: 2023-04-29
  Administered 2023-04-29: 1000 ug via INTRAMUSCULAR

## 2023-04-29 NOTE — Progress Notes (Signed)
Pt here for monthly B12 injection per Dr.Kuneff   B12 1000mcg given IM, and pt tolerated injection well.   Next B12 injection scheduled for 1 month.       

## 2023-05-16 ENCOUNTER — Other Ambulatory Visit: Payer: Self-pay | Admitting: Physician Assistant

## 2023-05-18 ENCOUNTER — Other Ambulatory Visit: Payer: Self-pay | Admitting: Obstetrics and Gynecology

## 2023-05-18 DIAGNOSIS — Z1231 Encounter for screening mammogram for malignant neoplasm of breast: Secondary | ICD-10-CM

## 2023-05-28 ENCOUNTER — Ambulatory Visit: Payer: No Typology Code available for payment source | Admitting: Family Medicine

## 2023-06-01 ENCOUNTER — Telehealth: Payer: Self-pay | Admitting: Family Medicine

## 2023-06-01 ENCOUNTER — Ambulatory Visit: Payer: BC Managed Care – PPO

## 2023-06-01 ENCOUNTER — Other Ambulatory Visit: Payer: Self-pay | Admitting: Gastroenterology

## 2023-06-01 ENCOUNTER — Other Ambulatory Visit: Payer: Self-pay

## 2023-06-01 DIAGNOSIS — Z6824 Body mass index (BMI) 24.0-24.9, adult: Secondary | ICD-10-CM | POA: Diagnosis not present

## 2023-06-01 DIAGNOSIS — Z01419 Encounter for gynecological examination (general) (routine) without abnormal findings: Secondary | ICD-10-CM | POA: Diagnosis not present

## 2023-06-01 MED ORDER — UBRELVY 100 MG PO TABS: 100.0000 mg | ORAL_TABLET | Freq: Every day | ORAL | 2 refills | Status: DC | PRN

## 2023-06-01 NOTE — Addendum Note (Signed)
Addended by: Deatra James on: 06/01/2023 02:45 PM   Modules accepted: Orders

## 2023-06-01 NOTE — Telephone Encounter (Signed)
Pt is asking if her Ubrogepant (UBRELVY) 100 MG TABS  can be called in until her upcoming appointment next month.  Pt asking it be called into [facility] [phone]

## 2023-06-11 ENCOUNTER — Ambulatory Visit: Payer: No Typology Code available for payment source | Admitting: Family Medicine

## 2023-06-18 DIAGNOSIS — D2262 Melanocytic nevi of left upper limb, including shoulder: Secondary | ICD-10-CM | POA: Diagnosis not present

## 2023-06-18 DIAGNOSIS — Z85828 Personal history of other malignant neoplasm of skin: Secondary | ICD-10-CM | POA: Diagnosis not present

## 2023-06-18 DIAGNOSIS — D225 Melanocytic nevi of trunk: Secondary | ICD-10-CM | POA: Diagnosis not present

## 2023-06-18 DIAGNOSIS — D485 Neoplasm of uncertain behavior of skin: Secondary | ICD-10-CM | POA: Diagnosis not present

## 2023-06-18 DIAGNOSIS — D2261 Melanocytic nevi of right upper limb, including shoulder: Secondary | ICD-10-CM | POA: Diagnosis not present

## 2023-06-18 DIAGNOSIS — B078 Other viral warts: Secondary | ICD-10-CM | POA: Diagnosis not present

## 2023-06-23 ENCOUNTER — Ambulatory Visit (INDEPENDENT_AMBULATORY_CARE_PROVIDER_SITE_OTHER): Payer: BC Managed Care – PPO

## 2023-06-23 DIAGNOSIS — E538 Deficiency of other specified B group vitamins: Secondary | ICD-10-CM

## 2023-06-23 NOTE — Progress Notes (Addendum)
Pt here for monthly B12 injection per Dr Kuneff ? ?B12 1000mcg given IM, and pt tolerated injection well. ? ?Next B12 injection scheduled for 2 weeks ? ?

## 2023-07-16 ENCOUNTER — Ambulatory Visit
Admission: RE | Admit: 2023-07-16 | Discharge: 2023-07-16 | Disposition: A | Discharge status: Home / Self Care | Payer: BC Managed Care – PPO | Source: Ambulatory Visit | Attending: Obstetrics and Gynecology | Admitting: Obstetrics and Gynecology

## 2023-07-16 DIAGNOSIS — Z1231 Encounter for screening mammogram for malignant neoplasm of breast: Secondary | ICD-10-CM | POA: Diagnosis not present

## 2023-07-17 DIAGNOSIS — D485 Neoplasm of uncertain behavior of skin: Secondary | ICD-10-CM | POA: Diagnosis not present

## 2023-07-17 DIAGNOSIS — L988 Other specified disorders of the skin and subcutaneous tissue: Secondary | ICD-10-CM | POA: Diagnosis not present

## 2023-07-18 NOTE — Progress Notes (Deleted)
[facility] OFFICE PROGRESS NOTE  Patricia Overlie, MD [address] [place] [address]  DIAGNOSIS: Iron Deficiency anemia   PRIOR THERAPY: None  CURRENT THERAPY: Oral iron supplements with every other day  p.o. daily.   INTERVAL HISTORY: Patricia Alvarez 62 y.o. Alvarez returns to Patricia clinic today for a follow-up visit.  Patricia patient was first seen in Patricia clinic on 01/27/2023 to establish care for history of iron deficiency anemia.  At that time, her CBC and CMP were within normal limits.  Due to no significant iron deficiency, we recommended that she continue on oral iron supplements.  If she had worsening anemia then we would consider her for IV iron. She then was seen on 04/27/23 and her Hbg was within normal limits. Recommended she continue on her iron supplement.    Since last being seen, she denies any major changes in her health.   She continues to be compliant with her over-Patricia-counter iron supplement every other day. She is currently taking iron polysaccharide every other day for tolerability.  She also has a history of folate and B12 deficiency for which she takes folic acid and receives monthly B12 injections through her PCPs office.   She follows with Dr. Russella Dar from gastroenterology.  She had an EGD performed on 09/22/2022 that shows grade 1 esophagitis without any evidence of bleeding and small hiatal hernia.  Her last colonoscopy was in January 2020 which was normal except for grade 1 internal hemorrhoids without bleeding.  She denies any dietary restrictions although she does prefer chicken and fish.  Denies any abnormal bleeding or bruising including epistaxis, gingival bleeding, hemoptysis, hematemesis, melena, or hematochezia.  Denies any lightheadedness or craving of ice chips.  She sometimes may have dyspnea on exertion and lack of energy.  She is here today for evaluation and repeat blood work.   MEDICAL HISTORY: Past Medical History:  Diagnosis  Date   Allergic rhinitis    Allergy    B12 deficiency    Back pain    Chicken pox    Colon polyp 2015   TUBULAR ADENOMA (X 1).   DDD (degenerative disc disease), cervical    Endometriosis of uterus    GERD (gastroesophageal reflux disease)    Hyperlipidemia    IBS (irritable bowel syndrome)    Internal hemorrhoid    Migraines    Rectal bleeding    Vitamin D deficiency     ALLERGIES:  is allergic to codeine, crestor [rosuvastatin], gluten meal, morphine, oxycodone-acetaminophen, prednisone, and sulfa antibiotics.  MEDICATIONS:  Current Outpatient Medications  Medication Sig Dispense Refill   aspirin-acetaminophen-caffeine (EXCEDRIN MIGRAINE) 250-250-65 MG tablet Take by mouth as needed for headache.      atorvastatin (LIPITOR) 10 MG tablet Take 0.5 tablets (5 mg total) by mouth daily. 45 tablet 3   esomeprazole (NEXIUM) 20 MG capsule Take 1 capsule (20 mg total) by mouth 2 (two) times daily before a meal. 180 capsule 3   estradiol (ESTRACE) 0.5 MG tablet Take 0.5 mg by mouth daily.   3   folic acid (FOLVITE) 1 MG tablet TAKE 1 TABLET BY MOUTH EVERY DAY 90 tablet 1   iron polysaccharides (NIFEREX) 150 MG capsule TAKE 1 CAPSULE BY MOUTH DAILY WITH BREAKFAST. 90 capsule 0   rizatriptan (MAXALT-MLT) 10 MG disintegrating tablet Take 1 tablet (10 mg total) by mouth as needed for migraine. May repeat in 2 hours if needed 9 tablet 12   Ubrogepant (UBRELVY) 100  MG TABS Take 1 tablet (100 mg total) by mouth daily as needed. Take one tablet at onset of headache, may repeat 1 tablet in 2 hours, no more than 2 tablets in 24 hours 10 tablet 2   Vitamin D, Ergocalciferol, 2000 units CAPS Take 2,000 Units by mouth daily.     Current Facility-Administered Medications  Medication Dose Route Frequency Provider Last Rate Last Admin   cyanocobalamin (VITAMIN B12) injection 1,000 mcg  1,000 mcg Intramuscular Q30 days Kuneff, Renee A, DO   1,000 mcg at 06/23/23 1041    SURGICAL HISTORY:  Past  Surgical History:  Procedure Laterality Date   ABDOMINAL HYSTERECTOMY  1994   APPENDECTOMY  02/2000   and ovary   AUGMENTATION MAMMAPLASTY Bilateral 1998   Saline,retropectoral    COLONOSCOPY W/ BIOPSIES  2015   multiple   COSMETIC SURGERY  Tummy tuck   ESOPHAGOGASTRODUODENOSCOPY  2011   multiple   JOINT REPLACEMENT  2019   LAPAROSCOPY     OOPHORECTOMY Left 02/2000   w/appendectomy   PARTIAL HYSTERECTOMY  1994   right ovary and uterus   TOTAL HIP ARTHROPLASTY Right 08/2018   August 31, 2018   TUBAL LIGATION  1990    REVIEW OF SYSTEMS:   Review of Systems  Constitutional: Negative for appetite change, chills, fatigue, fever and unexpected weight change.  HENT:   Negative for mouth sores, nosebleeds, sore throat and trouble swallowing.   Eyes: Negative for eye problems and icterus.  Respiratory: Negative for cough, hemoptysis, shortness of breath and wheezing.   Cardiovascular: Negative for chest pain and leg swelling.  Gastrointestinal: Negative for abdominal pain, constipation, diarrhea, nausea and vomiting.  Genitourinary: Negative for bladder incontinence, difficulty urinating, dysuria, frequency and hematuria.   Musculoskeletal: Negative for back pain, gait problem, neck pain and neck stiffness.  Skin: Negative for itching and rash.  Neurological: Negative for dizziness, extremity weakness, gait problem, headaches, light-headedness and seizures.  Hematological: Negative for adenopathy. Does not bruise/bleed easily.  Psychiatric/Behavioral: Negative for confusion, depression and sleep disturbance. Patricia patient is not nervous/anxious.     PHYSICAL EXAMINATION:  There were no vitals taken for this visit.  ECOG PERFORMANCE STATUS: {CHL ONC ECOG [id number]  Physical Exam  Constitutional: Oriented to person, place, and time and well-developed, well-nourished, and in no distress. No distress.  HENT:  Head: Normocephalic and atraumatic.  Mouth/Throat: Oropharynx is  clear and moist. No oropharyngeal exudate.  Eyes: Conjunctivae are normal. Right eye exhibits no discharge. Left eye exhibits no discharge. No scleral icterus.  Neck: Normal range of motion. Neck supple.  Cardiovascular: Normal rate, regular rhythm, normal heart sounds and intact distal pulses.   Pulmonary/Chest: Effort normal and breath sounds normal. No respiratory distress. No wheezes. No rales.  Abdominal: Soft. Bowel sounds are normal. Exhibits no distension and no mass. There is no tenderness.  Musculoskeletal: Normal range of motion. Exhibits no edema.  Lymphadenopathy:    No cervical adenopathy.  Neurological: Alert and oriented to person, place, and time. Exhibits normal muscle tone. Gait normal. Coordination normal.  Skin: Skin is warm and dry. No rash noted. Not diaphoretic. No erythema. No pallor.  Psychiatric: Mood, memory and judgment normal.  Vitals reviewed.  LABORATORY DATA: Lab Results  Component Value Date   WBC 5.6 04/27/2023   HGB 13.1 04/27/2023   HCT 39.0 04/27/2023   MCV 95.4 04/27/2023   PLT 275 04/27/2023      Chemistry      Component Value Date/Time   NA  138 01/27/2023 1303   NA 143 05/25/2019 0000   K 3.7 01/27/2023 1303   CL 102 01/27/2023 1303   CO2 28 01/27/2023 1303   BUN 12 01/27/2023 1303   BUN 11 05/25/2019 0000   CREATININE 0.70 01/27/2023 1303      Component Value Date/Time   CALCIUM 9.5 01/27/2023 1303   ALKPHOS 90 04/20/2023 1254   AST 23 04/20/2023 1254   AST 18 01/27/2023 1303   ALT 12 04/20/2023 1254   ALT 11 01/27/2023 1303   BILITOT 0.2 04/20/2023 1254   BILITOT 0.4 01/27/2023 1303       RADIOGRAPHIC STUDIES:  MM 3D SCREEN BREAST W/IMPLANT BILATERAL  Result Date: 07/17/2023 CLINICAL DATA:  Screening. EXAM: DIGITAL SCREENING BILATERAL MAMMOGRAM WITH IMPLANTS, CAD AND TOMOSYNTHESIS TECHNIQUE: Bilateral screening digital craniocaudal and mediolateral oblique mammograms were obtained. Bilateral screening digital breast  tomosynthesis was performed. Patricia images were evaluated with computer-aided detection. Standard and/or implant displaced views were performed. COMPARISON:  Previous exam(s). ACR Breast Density Category b: There are scattered areas of fibroglandular density. FINDINGS: Patricia patient has retropectoral implants. There are no findings suspicious for malignancy. IMPRESSION: No mammographic evidence of malignancy. A result letter of this screening mammogram will be mailed directly to Patricia patient. RECOMMENDATION: Screening mammogram in one year. (Code:SM-B-01Y) BI-RADS CATEGORY  1: Negative. Electronically Signed   By: Elberta Fortis M.D.   On: 07/17/2023 09:55     ASSESSMENT/PLAN:  This is a very pleasant 62 year old Caucasian Alvarez referred to Patricia clinic for iron deficiency without anemia.   She is currently taking oral iron supplements with p.o. daily every other day.   Labs were reviewed. Her Hbg is normal at ***. Her iron studies are pending. Unless she is significantly low ferritin, she likely will not need any additional IV iron at this time.  However, I will send her a MyChart message tomorrow once I have Patricia results of her ferritin and iron studies.  Recommend labs and follow-up visit in 6 months.  If she has worsening anemia or starts developing more significant iron deficiency, then he would be happy to arrange for IV iron at that time.   For now she will continue taking her prescription iron supplement every other day. She is taking this with vitamin C    She will continue receiving B12 injections and take folic acid supplements as prescribed by her PCP   Patricia patient was advised to call immediately if she has any concerning symptoms in Patricia interval. Patricia patient voices understanding of current disease status and treatment options and is in agreement with Patricia current care plan. All questions were answered. Patricia patient knows to call Patricia clinic with any problems, questions or concerns. We can  certainly see Patricia patient much sooner if necessary          No orders of Patricia defined types were placed in this encounter.    I spent {CHL ONC TIME VISIT - NGEXB:[id number]} counseling Patricia patient face to face. Patricia total time spent in Patricia appointment was {CHL ONC TIME VISIT - UUVOZ:[id number]}.  Norvel Wenker L Conor Lata, PA-C 07/18/23

## 2023-07-21 ENCOUNTER — Ambulatory Visit (INDEPENDENT_AMBULATORY_CARE_PROVIDER_SITE_OTHER): Payer: BC Managed Care – PPO

## 2023-07-21 DIAGNOSIS — E538 Deficiency of other specified B group vitamins: Secondary | ICD-10-CM

## 2023-07-21 NOTE — Progress Notes (Signed)
Pt here for monthly B12 injection per Dr.Kuneff.   B12 given IM, and pt tolerated injection well.  Next B12 injection scheduled for 1 month.

## 2023-07-21 NOTE — Patient Instructions (Signed)

## 2023-07-21 NOTE — Progress Notes (Unsigned)
PATIENT: Patricia Alvarez DOB: [birth year]  REASON FOR VISIT: follow up HISTORY FROM: patient  No chief complaint on file.   HISTORY OF PRESENT ILLNESS:  07/21/23 ALL: Patricia Alvarez returns for follow up for migraines. She continues Ubrelvy PRN.   06/04/2022 ALL: Patricia Alvarez returns for follow up for migraines. We switched abortie therapy from rizatriptan to Nurtec at last visit, however, it was not effective and switched to [place] in 06/2021. Since, she reports doing well. Migraines are stable. She may have 5-6 per month. She feels Patricia Alvarez 100mg  works best. She occasionally needs to take rizatriptan if Patricia Alvarez doesn't abort migraine. She averages less than 10 total doses of abortive meds each month.   06/04/2021 ALL: She returns for follow up for migraines. She continues rizatriptan for abortive therapy. She feels that she is doing fairly well. She reports having about 8 headache days a month, half are migrainous. She feels rizatriptan continues to be helpful for abortive therapy but she usually has to repeat dose. She has more sleepiness with second dose and sometimes has a rebound headache the next day. She denies any other syncopal/unresponsive spells. She feels this was related to stress with previous employment.   05/05/2019 ALL: Patricia Alvarez is a 62 y.o. female here today for follow up for follow-up of migraines.  She continues to have approximately 1-2 migraines per month.  She reports that rizatriptan works if she catches her headaches soon enough.  She has been attempting treatment with Tylenol prior to this prescription.  She reports that sometimes she has to take 2 doses of rizatriptan to abort migraine.  Otherwise she is doing well.  No other concerns today.  HISTORY: (copied from Dr Richrd Humbles note on 11/08/2018)  UPDATE (11/08/18, VRP): Since last visit, doing well. She reports that she has had a migraine about once or twice a month since being seen in July, 2019. She reports that the migraine  is usually located behind her ear (usually right sided but can occur on the left). Pain is described as a pulsating, pressure sensation that radiates up to the top of her head. She will have light sensitivity and occasionally nausea with migraine. She can lie in a dark room with light pressure to her head and headaches improve. She has noted an unusual smell prior to migraine. She is taking Maxalt with onset of migraine and this usually helps. She feels that she usually has another migraine the following day that is relieved with Maxalt as well. She rarely takes an Excedrin for non migrainous headaches. She denies stroke like symptoms with migraine. She is contributing the improvement to treatment of vitamin B12 and vitamin D deficiency. She has been treated with B12 injections as well as oral supplements. She is also taking vitamin D and magnesium supplements OTC. She reports that her numbness and tingling in her right hand and arm is significantly improved.    PRIOR HPI (05/03/2018, VRP): 62 year old female here for evaluation of headaches.   Patient had onset of headaches sometime in her late 65s.  She describes pounding, unilateral, mainly left-sided, throbbing severe headaches associated with nausea, blurred vision, sensitivity to sound, seeing dots and spots.  Headaches were intermittent for many years.  Patient treated these with over-the-counter medications.   The last couple of years patient has had slight change in her headaches with sharp shooting pain in her left ear, left eye, facial numbness, lip numbness, drooling sensation, arm numbness.  These also were associated with headaches.  Patient feeling headaches and pain behind her ears and behind her head.  Headaches increased in April 2019.  Now patient having headaches 3-4 times per week.  Patient had a CT scan and MRI of the brain, as well as emergency room evaluation, and was diagnosed with probable complicated migraine.  She was prescribed  amitriptyline but has not tried it yet.   Patient has family history of migraine in her mother.  No specific triggering factors currently for her headaches.   Patient has a long history of somewhat interrupted sleep and mild insomnia.  She is able to fall asleep without difficulty but then tends to wake up in the middle the night and has difficulty falling back asleep.    REVIEW OF SYSTEMS: Out of a complete 14 system review of symptoms, the patient complains only of the following symptoms, headaches and all other reviewed systems are negative.  ALLERGIES: Allergies  Allergen Reactions   Codeine Itching and Nausea Only    REACTION: nausea   Crestor [Rosuvastatin] Other (See Comments)    Myalgias    Gluten Meal Other (See Comments)    Stomach swells    Morphine Nausea Only    REACTION: nausea   Oxycodone-Acetaminophen Nausea And Vomiting   Prednisone     REACTION: Hyperactive   Sulfa Antibiotics Nausea Only    HOME MEDICATIONS: Outpatient Medications Prior to Visit  Medication Sig Dispense Refill   aspirin-acetaminophen-caffeine (EXCEDRIN MIGRAINE) 250-250-65 MG tablet Take by mouth as needed for headache.      atorvastatin (LIPITOR) 10 MG tablet Take 0.5 tablets (5 mg total) by mouth daily. 45 tablet 3   esomeprazole (NEXIUM) 20 MG capsule Take 1 capsule (20 mg total) by mouth 2 (two) times daily before a meal. 180 capsule 3   estradiol (ESTRACE) 0.5 MG tablet Take 0.5 mg by mouth daily.   3   folic acid (FOLVITE) 1 MG tablet TAKE 1 TABLET BY MOUTH EVERY DAY 90 tablet 1   iron polysaccharides (NIFEREX) 150 MG capsule TAKE 1 CAPSULE BY MOUTH DAILY WITH BREAKFAST. 90 capsule 0   rizatriptan (MAXALT-MLT) 10 MG disintegrating tablet Take 1 tablet (10 mg total) by mouth as needed for migraine. May repeat in 2 hours if needed 9 tablet 12   Ubrogepant (UBRELVY) 100 MG TABS Take 1 tablet (100 mg total) by mouth daily as needed. Take one tablet at onset of headache, may repeat 1 tablet in  2 hours, no more than 2 tablets in 24 hours 10 tablet 2   Vitamin D, Ergocalciferol, 2000 units CAPS Take 2,000 Units by mouth daily.     Facility-Administered Medications Prior to Visit  Medication Dose Route Frequency Provider Last Rate Last Admin   cyanocobalamin (VITAMIN B12) injection 1,000 mcg  1,000 mcg Intramuscular Q30 days Kuneff, Renee A, DO   1,000 mcg at 07/21/23 0913    PAST MEDICAL HISTORY: Past Medical History:  Diagnosis Date   Allergic rhinitis    Allergy    B12 deficiency    Back pain    Chicken pox    Colon polyp 2015   TUBULAR ADENOMA (X 1).   DDD (degenerative disc disease), cervical    Endometriosis of uterus    GERD (gastroesophageal reflux disease)    Hyperlipidemia    IBS (irritable bowel syndrome)    Internal hemorrhoid    Migraines    Rectal bleeding    Vitamin D deficiency     PAST SURGICAL HISTORY: Past Surgical History:  Procedure  Laterality Date   ABDOMINAL HYSTERECTOMY  1994   APPENDECTOMY  02/2000   and ovary   AUGMENTATION MAMMAPLASTY Bilateral 1998   Saline,retropectoral    COLONOSCOPY W/ BIOPSIES  2015   multiple   COSMETIC SURGERY  Tummy tuck   ESOPHAGOGASTRODUODENOSCOPY  2011   multiple   JOINT REPLACEMENT  2019   LAPAROSCOPY     OOPHORECTOMY Left 02/2000   w/appendectomy   PARTIAL HYSTERECTOMY  1994   right ovary and uterus   TOTAL HIP ARTHROPLASTY Right 08/2018   August 31, 2018   TUBAL LIGATION  1990    FAMILY HISTORY: Family History  Problem Relation Age of Onset   Hyperlipidemia Mother 38   Heart failure Mother 52   Arthritis Mother    Asthma Mother    Hearing loss Mother    Heart disease Mother    Hypertension Mother    Miscarriages / [place] Mother    Colon polyps Father    Stroke Father    Hearing loss Father    Dementia Father    Parkinson's disease Brother    Breast cancer Maternal Grandmother 89   Colon cancer Neg Hx    Esophageal cancer Neg Hx    Rectal cancer Neg Hx    Stomach cancer Neg  Hx     SOCIAL HISTORY: Social History   Socioeconomic History   Marital status: Married    Spouse name: Not on file   Number of children: 2   Years of education: 12   Highest education level: Not on file  Occupational History   Occupation: Cytogeneticist: [employer]  Tobacco Use   Smoking status: Never   Smokeless tobacco: Never  Vaping Use   Vaping status: Never Used  Substance and Sexual Activity   Alcohol use: Never   Drug use: Never   Sexual activity: Yes    Partners: Male    Birth control/protection: Surgical  Other Topics Concern   Not on file  Social History Narrative   Married. 2 children.    HS graduate. Employed as a Librarian, academic.    Uses bicycle helmet, smoke alarm in the home.   Feels safe in her relationships.   Caffeine- unsweet 1 quart most days.    Social Determinants of Health   Financial Resource Strain: Not on file  Food Insecurity: Not on file  Transportation Needs: Not on file  Physical Activity: Not on file  Stress: Not on file  Social Connections: Unknown (03/07/2022)   [facility]   Social Network    Social Network: Not on file  Intimate Partner Violence: Unknown (01/27/2022)   [facility] from [facility], [facility]   HITS    Physically Hurt: Not on file    Insult or Talk Down To: Not on file    Threaten Physical Harm: Not on file    Scream or Curse: Not on file      PHYSICAL EXAM  There were no vitals filed for this visit.    There is no height or weight on file to calculate BMI.  Generalized: Well developed, in no acute distress  Cardiology: normal rate and rhythm, no murmur noted Neurological examination  Mentation: Alert oriented to time, place, history taking. Follows all commands speech and language fluent Cranial nerve II-XII: Pupils were equal round reactive to light. Extraocular movements were full, visual field were full on confrontational test. Facial sensation  and strength were normal. Head turning  and shoulder shrug  were normal and symmetric. Motor: The motor testing reveals 5 over 5 strength of all 4 extremities. Good symmetric motor tone is noted throughout.  Gait and station: Gait is normal.    DIAGNOSTIC DATA (LABS, IMAGING, TESTING) - I reviewed patient records, labs, notes, testing and imaging myself where available.      No data to display           Lab Results  Component Value Date   WBC 5.6 04/27/2023   HGB 13.1 04/27/2023   HCT 39.0 04/27/2023   MCV 95.4 04/27/2023   PLT 275 04/27/2023      Component Value Date/Time   NA 138 01/27/2023 1303   NA 143 05/25/2019 0000   K 3.7 01/27/2023 1303   CL 102 01/27/2023 1303   CO2 28 01/27/2023 1303   GLUCOSE 99 01/27/2023 1303   BUN 12 01/27/2023 1303   BUN 11 05/25/2019 0000   CREATININE 0.70 01/27/2023 1303   CALCIUM 9.5 01/27/2023 1303   PROT 6.2 04/20/2023 1254   ALBUMIN 4.2 04/20/2023 1254   AST 23 04/20/2023 1254   AST 18 01/27/2023 1303   ALT 12 04/20/2023 1254   ALT 11 01/27/2023 1303   ALKPHOS 90 04/20/2023 1254   BILITOT 0.2 04/20/2023 1254   BILITOT 0.4 01/27/2023 1303   GFRNONAA >60 01/27/2023 1303   GFRAA >60 04/15/2018 1137   Lab Results  Component Value Date   CHOL 164 04/20/2023   HDL 53 04/20/2023   LDLCALC 90 04/20/2023   LDLDIRECT 168.7 01/06/2008   TRIG 118 04/20/2023   CHOLHDL 3.1 04/20/2023   Lab Results  Component Value Date   HGBA1C 5.6 11/27/2022   Lab Results  Component Value Date   VITAMINB12 711 01/27/2023   Lab Results  Component Value Date   TSH 3.87 11/27/2022     ASSESSMENT AND PLAN 62 y.o. year old female  has a past medical history of Allergic rhinitis, Allergy, B12 deficiency, Back pain, Chicken pox, Colon polyp (2015), DDD (degenerative disc disease), cervical, Endometriosis of uterus, GERD (gastroesophageal reflux disease), Hyperlipidemia, IBS (irritable bowel syndrome), Internal hemorrhoid, Migraines, Rectal  bleeding, and Vitamin D deficiency. here with   No diagnosis found.   Sherrese is doing well. We will continue Ubrelvy 100mg  as needed for abortive therapy. She may use rizatriptan in combination with Ubrelvy for intractable headaches but advised against regular use of any abortive agent. Healthy lifestyle habits advised. Annual follow-up advised, sooner if needed.  She verbalizes understanding and agreement this plan.   No orders of the defined types were placed in this encounter.     No orders of the defined types were placed in this encounter.     Christena Deem 07/21/2023, 12:37 PM [facility] [address] [place], [place] [address] [phone]

## 2023-07-22 ENCOUNTER — Encounter: Payer: Self-pay | Admitting: Family Medicine

## 2023-07-22 ENCOUNTER — Ambulatory Visit: Payer: BC Managed Care – PPO | Admitting: Family Medicine

## 2023-07-22 VITALS — BP 97/62 | HR 71 | Ht 65.0 in | Wt 145.0 lb

## 2023-07-22 DIAGNOSIS — G43109 Migraine with aura, not intractable, without status migrainosus: Secondary | ICD-10-CM | POA: Diagnosis not present

## 2023-07-22 MED ORDER — UBRELVY 100 MG PO TABS: 100.0000 mg | ORAL_TABLET | Freq: Every day | ORAL | 11 refills | Status: DC | PRN

## 2023-07-28 ENCOUNTER — Inpatient Hospital Stay: Payer: BC Managed Care – PPO | Admitting: Physician Assistant

## 2023-07-28 ENCOUNTER — Inpatient Hospital Stay: Payer: BC Managed Care – PPO

## 2023-07-28 ENCOUNTER — Inpatient Hospital Stay: Payer: BC Managed Care – PPO | Attending: Physician Assistant

## 2023-07-28 ENCOUNTER — Inpatient Hospital Stay: Payer: BC Managed Care – PPO | Admitting: Internal Medicine

## 2023-07-28 VITALS — HR 68 | Temp 98.2°F | Resp 16 | Ht 65.0 in | Wt 144.7 lb

## 2023-07-28 DIAGNOSIS — K922 Gastrointestinal hemorrhage, unspecified: Secondary | ICD-10-CM | POA: Diagnosis not present

## 2023-07-28 DIAGNOSIS — G43909 Migraine, unspecified, not intractable, without status migrainosus: Secondary | ICD-10-CM | POA: Insufficient documentation

## 2023-07-28 DIAGNOSIS — E785 Hyperlipidemia, unspecified: Secondary | ICD-10-CM | POA: Insufficient documentation

## 2023-07-28 DIAGNOSIS — D508 Other iron deficiency anemias: Secondary | ICD-10-CM | POA: Diagnosis not present

## 2023-07-28 DIAGNOSIS — D500 Iron deficiency anemia secondary to blood loss (chronic): Secondary | ICD-10-CM | POA: Diagnosis not present

## 2023-07-28 DIAGNOSIS — K219 Gastro-esophageal reflux disease without esophagitis: Secondary | ICD-10-CM | POA: Insufficient documentation

## 2023-07-28 DIAGNOSIS — D509 Iron deficiency anemia, unspecified: Secondary | ICD-10-CM

## 2023-07-28 LAB — CBC WITH DIFFERENTIAL (CANCER CENTER ONLY)
Abs Immature Granulocytes: 0.01 10*3/uL (ref 0.00–0.07)
Basophils Absolute: 0.1 10*3/uL (ref 0.0–0.1)
Basophils Relative: 1 %
Eosinophils Absolute: 0.1 10*3/uL (ref 0.0–0.5)
Eosinophils Relative: 3 %
HCT: 40.9 % (ref 36.0–46.0)
Hemoglobin: 14.1 g/dL (ref 12.0–15.0)
Immature Granulocytes: 0 %
Lymphocytes Relative: 41 %
Lymphs Abs: 1.9 10*3/uL (ref 0.7–4.0)
MCH: 32.2 pg (ref 26.0–34.0)
MCHC: 34.5 g/dL (ref 30.0–36.0)
MCV: 93.4 fL (ref 80.0–100.0)
Monocytes Absolute: 0.3 10*3/uL (ref 0.1–1.0)
Monocytes Relative: 6 %
Neutro Abs: 2.3 10*3/uL (ref 1.7–7.7)
Neutrophils Relative %: 49 %
Platelet Count: 268 10*3/uL (ref 150–400)
RBC: 4.38 MIL/uL (ref 3.87–5.11)
RDW: 12.8 % (ref 11.5–15.5)
WBC Count: 4.6 10*3/uL (ref 4.0–10.5)
nRBC: 0 % (ref 0.0–0.2)

## 2023-07-28 LAB — IRON AND IRON BINDING CAPACITY (CC-WL,HP ONLY)
Iron: 168 ug/dL (ref 28–170)
Saturation Ratios: 41 % — ABNORMAL HIGH (ref 10.4–31.8)
TIBC: 413 ug/dL (ref 250–450)
UIBC: 245 ug/dL (ref 148–442)

## 2023-07-28 LAB — FERRITIN: Ferritin: 13 ng/mL (ref 11–307)

## 2023-07-28 NOTE — Progress Notes (Signed)
[facility] [phone]   [phone]  OFFICE PROGRESS NOTE  Patricia Overlie, MD [address] [place] [address]  DIAGNOSIS: Iron Deficiency anemia    PRIOR THERAPY: None    CURRENT THERAPY: Oral iron supplements with every other day  p.o. daily.   INTERVAL HISTORY: Patricia Alvarez 62 y.o. female returns to the clinic today for follow-up visit.Discussed the use of AI scribe software for clinical note transcription with the patient, who gave verbal consent to proceed.  History of Present Illness   Patricia Alvarez, a 55-year-old white female, presents for follow-up of iron deficiency anemia. The patient has been managing the condition with iron tablets taken every other day, alongside vitamin C or orange juice. The patient tolerates the iron tablets well, with no reported side effects such as stomach upset, nausea, vomiting, or constipation when taken every other day. Despite this, the patient reports persistent fatigue, which has led to a decrease in his usual level of activity.  In addition to iron deficiency anemia, the patient has a history of acid reflux, for which they take (Nexium) 20mg  once daily, despite the prescription indicating twice daily usage. The patient also has high cholesterol, managed with half a 10mg  tablet of Lipitor, which has effectively lowered his cholesterol levels. The patient experiences migraines approximately once a week, which are controlled with Bernita Raisin.  The patient's recent lab results show a hemoglobin level of 14.1 and a hematocrit of 40.9, with no abnormalities in the complete blood count (CBC). Iron studies and ferritin levels are pending. The patient's ferritin levels have previously been low, even when hemoglobin levels were within normal limits.  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 This has been noted as a potential  contributing factor to his reported fatigue.       MEDICAL HISTORY: Past Medical History:  Diagnosis Date   Allergic rhinitis    Allergy    B12 deficiency    Back pain    Chicken pox    Colon polyp 2015   TUBULAR ADENOMA (X 1).   DDD (degenerative disc disease), cervical    Endometriosis of uterus    GERD (gastroesophageal reflux disease)    Hyperlipidemia    IBS (irritable bowel syndrome)    Internal hemorrhoid    Migraines    Rectal bleeding    Vitamin D deficiency     ALLERGIES:  is allergic to codeine, crestor [rosuvastatin], gluten meal, morphine, oxycodone-acetaminophen, prednisone, and sulfa antibiotics.  MEDICATIONS:  Current Outpatient Medications  Medication Sig Dispense Refill   aspirin-acetaminophen-caffeine (EXCEDRIN MIGRAINE) 250-250-65 MG tablet Take by mouth as needed for headache.      atorvastatin (LIPITOR) 10 MG tablet Take 0.5 tablets (5 mg total) by mouth daily. 45 tablet 3   esomeprazole (NEXIUM) 20 MG capsule Take 1 capsule (20 mg total) by mouth 2 (two) times daily before a meal. 180 capsule 3   estradiol (ESTRACE) 0.5 MG tablet Take 0.5 mg by mouth daily.   3   folic acid (FOLVITE) 1 MG tablet TAKE 1 TABLET BY MOUTH EVERY DAY 90 tablet 1   iron polysaccharides (NIFEREX) 150 MG capsule TAKE 1 CAPSULE BY MOUTH DAILY WITH BREAKFAST. 90 capsule 0   rizatriptan (MAXALT-MLT) 10 MG disintegrating tablet Take 1 tablet (10 mg total) by mouth as needed for migraine. May repeat in 2 hours if needed 9 tablet 12  Ubrogepant (UBRELVY) 100 MG TABS Take 1 tablet (100 mg total) by mouth daily as needed. Take one tablet at onset of headache, may repeat 1 tablet in 2 hours, no more than 2 tablets in 24 hours 10 tablet 11   Vitamin D, Ergocalciferol, 2000 units CAPS Take 2,000 Units by mouth daily.     Current Facility-Administered Medications  Medication Dose Route Frequency Provider Last Rate Last Admin   cyanocobalamin (VITAMIN B12) injection 1,000 mcg  1,000 mcg  Intramuscular Q30 days Kuneff, Renee A, DO   1,000 mcg at 07/21/23 0913    SURGICAL HISTORY:  Past Surgical History:  Procedure Laterality Date   ABDOMINAL HYSTERECTOMY  1994   APPENDECTOMY  02/2000   and ovary   AUGMENTATION MAMMAPLASTY Bilateral 1998   Saline,retropectoral    COLONOSCOPY W/ BIOPSIES  2015   multiple   COSMETIC SURGERY  Tummy tuck   ESOPHAGOGASTRODUODENOSCOPY  2011   multiple   JOINT REPLACEMENT  2019   LAPAROSCOPY     OOPHORECTOMY Left 02/2000   w/appendectomy   PARTIAL HYSTERECTOMY  1994   right ovary and uterus   TOTAL HIP ARTHROPLASTY Right 08/2018   August 31, 2018   TUBAL LIGATION  1990    REVIEW OF SYSTEMS:  A comprehensive review of systems was negative except for: Constitutional: positive for fatigue   PHYSICAL EXAMINATION: General appearance: alert, cooperative, fatigued, and no distress Head: Normocephalic, without obvious abnormality, atraumatic Neck: no adenopathy, no JVD, supple, symmetrical, trachea midline, and thyroid not enlarged, symmetric, no tenderness/mass/nodules Lymph nodes: Cervical, supraclavicular, and axillary nodes normal. Resp: clear to auscultation bilaterally Back: symmetric, no curvature. ROM normal. No CVA tenderness. Cardio: regular rate and rhythm, S1, S2 normal, no murmur, click, rub or gallop GI: soft, non-tender; bowel sounds normal; no masses,  no organomegaly Extremities: extremities normal, atraumatic, no cyanosis or edema  ECOG PERFORMANCE STATUS: 1 - Symptomatic but completely ambulatory  Pulse 68, temperature 98.2 F (36.8 C), temperature source Oral, resp. rate 16, height 5\' 5"  (1.651 m), weight 144 lb 11.2 oz (65.6 kg), SpO2 100%.  LABORATORY DATA: Lab Results  Component Value Date   WBC 4.6 07/28/2023   HGB 14.1 07/28/2023   HCT 40.9 07/28/2023   MCV 93.4 07/28/2023   PLT 268 07/28/2023      Chemistry      Component Value Date/Time   NA 138 01/27/2023 1303   NA 143 05/25/2019 0000   K 3.7  01/27/2023 1303   CL 102 01/27/2023 1303   CO2 28 01/27/2023 1303   BUN 12 01/27/2023 1303   BUN 11 05/25/2019 0000   CREATININE 0.70 01/27/2023 1303      Component Value Date/Time   CALCIUM 9.5 01/27/2023 1303   ALKPHOS 90 04/20/2023 1254   AST 23 04/20/2023 1254   AST 18 01/27/2023 1303   ALT 12 04/20/2023 1254   ALT 11 01/27/2023 1303   BILITOT 0.2 04/20/2023 1254   BILITOT 0.4 01/27/2023 1303       RADIOGRAPHIC STUDIES: MM 3D SCREEN BREAST W/IMPLANT BILATERAL  Result Date: 07/17/2023 CLINICAL DATA:  Screening. EXAM: DIGITAL SCREENING BILATERAL MAMMOGRAM WITH IMPLANTS, CAD AND TOMOSYNTHESIS TECHNIQUE: Bilateral screening digital craniocaudal and mediolateral oblique mammograms were obtained. Bilateral screening digital breast tomosynthesis was performed. The images were evaluated with computer-aided detection. Standard and/or implant displaced views were performed. COMPARISON:  Previous exam(s). ACR Breast Density Category b: There are scattered areas of fibroglandular density. FINDINGS: The patient has retropectoral implants. There are no findings  suspicious for malignancy. IMPRESSION: No mammographic evidence of malignancy. A result letter of this screening mammogram will be mailed directly to the patient. RECOMMENDATION: Screening mammogram in one year. (Code:SM-B-01Y) BI-RADS CATEGORY  1: Negative. Electronically Signed   By: Elberta Fortis M.D.   On: 07/17/2023 09:55    ASSESSMENT AND PLAN: This is a very pleasant 62 years old white female with history of iron deficiency anemia secondary to gastrointestinal blood loss likely from internal hemorrhoids.  She is currently on over-the-counter ferrous sulfate with orange juice every other day and tolerating it fairly well. Assessment and Plan    Iron Deficiency Anemia Stable on iron tablets every other day with vitamin C. No side effects reported. Hemoglobin and hematocrit within normal range. Ferritin and iron studies  pending. -Continue current regimen of iron tablets every other day with vitamin C. -If ferritin is less than 10, will call with further recommendations.  Gastroesophageal Reflux Disease (GERD) Stable on Nexium 20mg  daily. No reported side effects. -Continue Nexium 20mg  daily.  Hyperlipidemia Stable on half of a 10mg  Lipitor tablet. No reported side effects. Lipid levels controlled. -Continue current regimen of half of a 10mg  Lipitor tablet.  Migraine Occurs approximately once a week. Controlled with Bernita Raisin. -Continue current regimen of Ubrelvy as needed for migraines.  Follow-up in 6 months unless symptoms of fatigue or exhaustion increase.   The patient was advised to call immediately if she has any concerning symptoms in the interval. The patient voices understanding of current disease status and treatment options and is in agreement with the current care plan.  All questions were answered. The patient knows to call the clinic with any problems, questions or concerns. We can certainly see the patient much sooner if necessary.  The total time spent in the appointment was 20 minutes.  Disclaimer: This note was dictated with voice recognition software. Similar sounding words can inadvertently be transcribed and may not be corrected upon review.

## 2023-07-29 ENCOUNTER — Encounter: Payer: Self-pay | Admitting: Gastroenterology

## 2023-08-04 ENCOUNTER — Ambulatory Visit: Payer: BC Managed Care – PPO | Admitting: Gastroenterology

## 2023-08-19 ENCOUNTER — Ambulatory Visit: Payer: BC Managed Care – PPO

## 2023-08-24 ENCOUNTER — Ambulatory Visit (INDEPENDENT_AMBULATORY_CARE_PROVIDER_SITE_OTHER): Payer: BC Managed Care – PPO

## 2023-08-24 DIAGNOSIS — E538 Deficiency of other specified B group vitamins: Secondary | ICD-10-CM

## 2023-08-24 NOTE — Progress Notes (Signed)
Pt here for monthly B12 injection per Dr.Kuneff   B12 given IM, and pt tolerated injection well.  Next B12 injection scheduled for 1 month, 09/21/23.

## 2023-08-24 NOTE — Progress Notes (Signed)
Pt here for monthly B12 injection per Dr Claiborne Billings  B12 given IM, and pt tolerated injection well.  Next B12 injection scheduled for 4 weeks.

## 2023-09-14 DIAGNOSIS — N644 Mastodynia: Secondary | ICD-10-CM | POA: Diagnosis not present

## 2023-09-14 DIAGNOSIS — L309 Dermatitis, unspecified: Secondary | ICD-10-CM | POA: Diagnosis not present

## 2023-09-21 ENCOUNTER — Ambulatory Visit: Payer: BC Managed Care – PPO

## 2023-09-23 ENCOUNTER — Ambulatory Visit (INDEPENDENT_AMBULATORY_CARE_PROVIDER_SITE_OTHER): Payer: BC Managed Care – PPO

## 2023-09-23 DIAGNOSIS — E538 Deficiency of other specified B group vitamins: Secondary | ICD-10-CM | POA: Diagnosis not present

## 2023-09-23 NOTE — Progress Notes (Signed)
Pt here for monthly B12 injection per Dr.Kuneff   B12 given IM, and pt tolerated injection well.   Next B12 injection scheduled for 1 month.  Please sign in absence of PCP

## 2023-10-05 ENCOUNTER — Other Ambulatory Visit: Payer: Self-pay | Admitting: Physician Assistant

## 2023-10-09 ENCOUNTER — Other Ambulatory Visit: Payer: Self-pay | Admitting: Gastroenterology

## 2023-10-09 DIAGNOSIS — K2100 Gastro-esophageal reflux disease with esophagitis, without bleeding: Secondary | ICD-10-CM

## 2023-10-20 ENCOUNTER — Ambulatory Visit (INDEPENDENT_AMBULATORY_CARE_PROVIDER_SITE_OTHER): Payer: BC Managed Care – PPO

## 2023-10-20 DIAGNOSIS — E538 Deficiency of other specified B group vitamins: Secondary | ICD-10-CM

## 2023-10-20 NOTE — Progress Notes (Signed)
Pt here for monthly B12 injection per [facility]  B12 given IM, and pt tolerated injection well.  Next B12 injection scheduled for 1 month

## 2023-10-23 ENCOUNTER — Encounter: Payer: Self-pay | Admitting: Nurse Practitioner

## 2023-10-23 ENCOUNTER — Ambulatory Visit: Payer: BC Managed Care – PPO | Admitting: Nurse Practitioner

## 2023-10-23 VITALS — BP 100/60 | HR 72 | Ht 64.0 in | Wt 144.4 lb

## 2023-10-23 DIAGNOSIS — E611 Iron deficiency: Secondary | ICD-10-CM | POA: Diagnosis not present

## 2023-10-23 DIAGNOSIS — Z860101 Personal history of adenomatous and serrated colon polyps: Secondary | ICD-10-CM

## 2023-10-23 DIAGNOSIS — K449 Diaphragmatic hernia without obstruction or gangrene: Secondary | ICD-10-CM

## 2023-10-23 DIAGNOSIS — K2100 Gastro-esophageal reflux disease with esophagitis, without bleeding: Secondary | ICD-10-CM | POA: Diagnosis not present

## 2023-10-23 DIAGNOSIS — K219 Gastro-esophageal reflux disease without esophagitis: Secondary | ICD-10-CM

## 2023-10-23 MED ORDER — FAMOTIDINE 20 MG PO TABS: 20.0000 mg | ORAL_TABLET | Freq: Every day | ORAL | 5 refills | Status: DC

## 2023-10-23 NOTE — Progress Notes (Signed)
Reviewed and agree with management plan. Change colonoscopy recall to a 10 year interval, Jan. 2030.  Venita Lick. Russella Dar, MD [facility]

## 2023-10-23 NOTE — Progress Notes (Signed)
ASSESSMENT    Brief Narrative:  62 y.o.  female known to Dr. Russella Dar  with a past medical history not limited to GERD, B12 deficiency, folate deficiency , adenomatous colon polyps, IDA, migraines  GERD with LA grade A esophagitis / small hiatal .  Symptoms were nicely controlled on low dose daily PPI but having postprandial chest pain over the last 1.5 weeks. Pain non-exertional, generally relieved with belching. She is concerned about a cardiac source of pain and has Cardiology appt on 1/14  Iron deficiency, without anemia.  Followed by Hematology. Treated with oral iron .   History of adenomatous colon polyps ( a 3 mm tubular adenoma in 2015). No polyps on follow up colonoscopy in 2020.   See [facility] for any additional medical & surgical history   PLAN   --Discussed anti-reflux measures such as avoidance of late meals / bedtime snacks, HOB elevation (or use of wedge pillow), weight reduction ( if applicable)  / maintaining a healthy BMI ( body mass index),  and avoidance of trigger foods and caffeine.  -- Continue omeprazole 20 mg before breakfast.  Will add famotidine 20 mg at bedtime. --Follow-up in 6 to 8 weeks.  By then she will have had a cardiac evaluation.  If cardiac workup negative but pain persist then she will call our office for further recommendations.  --If GERD symptoms can get back under good control with just a daily low-dose PPI might she be a candidate for TIF ?  --Will discuss timing of surveillance colonoscopy with patient's primary GI, Dr. Russella Dar.  Currently, she is on for a 7 year recall colonoscopy though she had no polyps on her last surveillance colonoscopy in 2020.    HPI   Chief complaint : Chest pain   Patient was last seen here January 2024 for management of GERD.  She also has history of iron deficiency anemia and B12 deficiency.  Labs were checked, she was iron deficient.  We started her on oral iron.  Follow-up ferritin was low, she was referred to  hematology for management of IDA   Dorean's GERD symptoms were well-controlled on omeprazole 20 mg daily until 1.5 weeks ago when she began having postprandial, sharp, mid chest pain radiating through to her back. The pain is not burning in nature and she doesn't have any regurgitation.  No odynophagia.  No dysphagia . She has had some associated left upper extremity pain .  No SHOB. Belching relieves the chest pain. She is still taking daily PPI ( never did increase to BID as we once had recommended). She hasn't tried anything for the pain. She reports having had similar pain before for which she underwent a gallbladder evaluation. She has been concerned that pain is cardiac in nature and made an appt with her cardiologist (who manages her hyperlipidemia). No abdominal pain or other GI complaints.   GI History / Pertinent GI Studies   **All endoscopic studies may not be included here    EGD Nov 2023  - LA Grade A esophagitis with no bleeding. - Small hiatal hernia. - Normal duodenal bulb and second portion of the duodenum. Biopsied. (Normal)  Surveillance colonoscopy January 2020 -- Internal hemorrhoids found.  Exam otherwise normal     Latest Ref Rng & Units 04/20/2023   12:54 PM 01/27/2023    1:03 PM 08/29/2022   11:57 AM  Hepatic Function  Total Protein 6.0 - 8.5 g/dL 6.2  6.9  6.8   Albumin 3.8 -  4.9 g/dL 4.2  4.3  4.2   AST 0 - 40 IU/L 23  18  19    ALT 0 - 32 IU/L 12  11  9    Alk Phosphatase 44 - 121 IU/L 90  61  65   Total Bilirubin 0.0 - 1.2 mg/dL 0.2  0.4  0.4   Bilirubin, Direct 0.00 - 0.40 mg/dL <1.61          Latest Ref Rng & Units 07/28/2023    9:31 AM 04/27/2023    2:55 PM 01/27/2023    1:03 PM  CBC  WBC 4.0 - 10.5 K/uL 4.6  5.6  6.9   Hemoglobin 12.0 - 15.0 g/dL 09.6  04.5  40.9   Hematocrit 36.0 - 46.0 % 40.9  39.0  38.3   Platelets 150 - 400 K/uL 268  275  299      Past Medical History:  Diagnosis Date   Allergic rhinitis    Allergy    B12 deficiency    Back  pain    Chicken pox    Colon polyp 2015   TUBULAR ADENOMA (X 1).   DDD (degenerative disc disease), cervical    Endometriosis of uterus    GERD (gastroesophageal reflux disease)    Hyperlipidemia    IBS (irritable bowel syndrome)    Internal hemorrhoid    Migraines    Rectal bleeding    Vitamin D deficiency     Past Surgical History:  Procedure Laterality Date   ABDOMINAL HYSTERECTOMY  1994   APPENDECTOMY  02/2000   and ovary   AUGMENTATION MAMMAPLASTY Bilateral 1998   Saline,retropectoral    COLONOSCOPY W/ BIOPSIES  2015   multiple   COSMETIC SURGERY  Tummy tuck   ESOPHAGOGASTRODUODENOSCOPY  2011   multiple   JOINT REPLACEMENT  2019   LAPAROSCOPY     OOPHORECTOMY Left 02/2000   w/appendectomy   PARTIAL HYSTERECTOMY  1994   right ovary and uterus   TOTAL HIP ARTHROPLASTY Right 08/2018   August 31, 2018   TUBAL LIGATION  1990    Family History  Problem Relation Age of Onset   Hyperlipidemia Mother 47   Heart failure Mother 65   Arthritis Mother    Asthma Mother    Hearing loss Mother    Heart disease Mother    Hypertension Mother    Miscarriages / [place] Mother    Colon polyps Father    Stroke Father    Hearing loss Father    Dementia Father    Parkinson's disease Brother    Breast cancer Maternal Grandmother 29   Colon cancer Neg Hx    Esophageal cancer Neg Hx    Rectal cancer Neg Hx    Stomach cancer Neg Hx     Current Medications, Allergies, Family History and Social History were reviewed in [employer] electronic medical record.     Current Outpatient Medications  Medication Sig Dispense Refill   aspirin-acetaminophen-caffeine (EXCEDRIN MIGRAINE) 250-250-65 MG tablet Take by mouth as needed for headache.      atorvastatin (LIPITOR) 10 MG tablet Take 0.5 tablets (5 mg total) by mouth daily. 45 tablet 3   esomeprazole (NEXIUM) 20 MG capsule TAKE 1 CAPSULE (20 MG TOTAL) BY MOUTH 2 (TWO) TIMES DAILY BEFORE A MEAL. 180 capsule 0   estradiol  (ESTRACE) 0.5 MG tablet Take 0.5 mg by mouth daily.   3   folic acid (FOLVITE) 1 MG tablet TAKE 1 TABLET BY MOUTH EVERY DAY 90  tablet 1   iron polysaccharides (NIFEREX) 150 MG capsule TAKE 1 CAPSULE BY MOUTH DAILY WITH BREAKFAST. 90 capsule 0   rizatriptan (MAXALT-MLT) 10 MG disintegrating tablet Take 1 tablet (10 mg total) by mouth as needed for migraine. May repeat in 2 hours if needed 9 tablet 12   Ubrogepant (UBRELVY) 100 MG TABS Take 1 tablet (100 mg total) by mouth daily as needed. Take one tablet at onset of headache, may repeat 1 tablet in 2 hours, no more than 2 tablets in 24 hours 10 tablet 11   Vitamin D, Ergocalciferol, 2000 units CAPS Take 2,000 Units by mouth daily.     Current Facility-Administered Medications  Medication Dose Route Frequency Provider Last Rate Last Admin   cyanocobalamin (VITAMIN B12) injection 1,000 mcg  1,000 mcg Intramuscular Q30 days Kuneff, Renee A, DO   1,000 mcg at 10/20/23 3244    Review of Systems: No urinary complaints. No fevers. No dizziness   Physical Exam  There were no vitals filed for this visit. Wt Readings from Last 3 Encounters:  07/28/23 144 lb 11.2 oz (65.6 kg)  07/22/23 145 lb (65.8 kg)  04/27/23 146 lb 4.8 oz (66.4 kg)    Ht 5\' 4"  (1.626 m)   BMI 24.84 kg/m  Constitutional:  Pleasant, generally well appearing female in no acute distress. Psychiatric: Normal mood and affect. Behavior is normal. EENT: Pupils normal.  Conjunctivae are normal. No scleral icterus. Neck supple.  Cardiovascular: Normal rate, regular rhythm.  Pulmonary/chest: Effort normal and breath sounds normal. No wheezing, rales or rhonchi. Abdominal: Soft, nondistended, nontender. Bowel sounds active throughout. There are no masses palpable. No hepatomegaly. Neurological: Alert and oriented to person place and time.    Willette Cluster, NP  10/23/2023, 8:25 AM

## 2023-10-23 NOTE — Patient Instructions (Addendum)
_______________________________________________________  If your blood pressure at your visit was 140/90 or greater, please contact your primary care physician to follow up on this.  _______________________________________________________  If you are age 61 or older, your body mass index should be between 23-30. Your Body mass index is 24.78 kg/m. If this is out of the aforementioned range listed, please consider follow up with your Primary Care Provider.  If you are age 34 or younger, your body mass index should be between 19-25. Your Body mass index is 24.78 kg/m. If this is out of the aformentioned range listed, please consider follow up with your Primary Care Provider.   ________________________________________________________  The [facility] providers would like to encourage you to use [facility] to communicate with providers for non-urgent requests or questions.  Due to long hold times on the telephone, sending your provider a message by [facility] may be a faster and more efficient way to get a response.  Please allow 48 business hours for a response.  Please remember that this is for non-urgent requests.  _______________________________________________________  We have sent the following medications to your pharmacy for you to pick up at your convenience: Pepcid  You have been scheduled for an appointment with Dr. Doy Hutching on 12-17-2023 at 950am . Please arrive 10 minutes early for your appointment.    Acid Reflux  Below are some measures you can take to hopefully improve acid reflux symptoms . We may have discussed some of these today in the office. Not everything on this list may apply to you   --Avoid late meals / bedtime snacks.   --Avoid trigger foods ( foods which you know tend to aggravate you reflux symptoms). Some common trigger foods include spicy foods, fatty foods, acidic foods, chocolate and caffeine.  --Elevate the head of bed 6-8 inches on blocks or bricks. If not able to  elevate the head of the bed consider purchasing a wedge pillow to sleep on.    --Weight reduction / maintain a healthy BMI ( body mass index) may be help with reflux symptoms  --Sometimes with the above mentioned "lifestyle changes" patients are able to reduce the amount of GERD medications they take. Our goal is to have you on the lowest effective dose of medication  Continue Omeprazole 20 mg 30 minutes before breakfast.   Add Famotidine 20 mg at bedtime, # 30 with 5 refills.

## 2023-10-26 ENCOUNTER — Telehealth: Payer: Self-pay | Admitting: *Deleted

## 2023-10-26 NOTE — Telephone Encounter (Signed)
Called patient to notify that her recall for colonoscopy is not in 2027, but in January 2030 instead due to there not being any noted polyps on her last colonoscopy procedure in 2020. Patient states she had this discussion with Dr. Russella Dar during her last visit and she is in agreement. Patient's recall date for colonoscopy was changed to 10/29/2028.

## 2023-10-26 NOTE — Telephone Encounter (Signed)
-----   Message from Willette Cluster sent at 10/23/2023 11:20 AM EST ----- Alona Bene,  Please call Marilla next week and let her know that after she left the office today I was looking over her chart and noticed that she is on for a 7 year recall colonoscopy in 2027.  She had polyps in 2015 but none on follow-up colonoscopy in 2020.  This should allow her to wait 10 years  (not 7) for her next colonoscopy.  Let her know I wanted to discuss this with Dr. Russella Dar who is her primary GI doctor.  He did agree that she can wait 10 years from last colonoscopy to have her next  one.    If she has any questions or concerns let me know.  Otherwise, would you please change her colonoscopy recall date to January 2030. Thanks

## 2023-11-01 NOTE — Progress Notes (Signed)
 Cardiology Office Note    Date:  11/04/2023  ID:  Patricia Alvarez, DOB 1961/03/05, MRN [id number] PCP:  Catherine Charlies LABOR, DO  Cardiologist:  Peter Jordan, MD  Electrophysiologist:  None   Chief Complaint: Precordial pain   History of Present Illness: .    Patricia Alvarez is a 63 y.o. female with visit-pertinent history of hyperlipidemia, migraines, vitamin B12 and D deficiency.  She had a coronary calcium  score in October 2020 which was 0.  She was evaluated by Dr. Jordan on 01/12/2023 at the request of her PCP for hypercholesterolemia.  She has previously tried on Crestor 10 mg daily but developed myalgias.  She was started on Lipitor 5 mg daily with improvement in her LDL down to 90.  Today she presents for requested follow-up.  She reports that she started having mid sternal chest pain over christmas holiday, that lasted several days would ease up but not completely resolve. Woke up at 4 am with a feeling of fullness, always worsened after eating. Has improved after starting on Famotidine  at night. Notes some middle back pain. She did have one episode lasting 15 mins with left arm pain, notes it was tender to the touch. Not specifically associated with exertion. Denies shortness of breath with chest pain.   ROS: .   Today she denies chest pain, shortness of breath, lower extremity edema, fatigue, palpitations, melena, hematuria, hemoptysis, diaphoresis, weakness, presyncope, syncope, orthopnea, and PND.  All other systems are reviewed and otherwise negative. Studies Reviewed: SABRA    EKG:  EKG is ordered today, personally reviewed, demonstrating  EKG Interpretation Date/Time:  Wednesday November 04 2023 08:30:34 EST Ventricular Rate:  65 PR Interval:  146 QRS Duration:  82 QT Interval:  406 QTC Calculation: 422 R Axis:   40  Text Interpretation: Normal sinus rhythm Normal ECG When compared with ECG of 15-Apr-2018 11:28, No significant change was found Confirmed by Ranard Harte [phone]) on  11/04/2023 8:36:37 AM   CV Studies:  Cardiac Studies & Procedures         CT SCANS  CT CARDIAC SCORING (SELF PAY ONLY) 08/18/2019  Addendum 08/18/2019  6:46 PM ADDENDUM REPORT: 08/18/2019 18:44  CLINICAL DATA:  Risk stratification  EXAM: Coronary Calcium  Score  TECHNIQUE: The patient was scanned on a [employer] scanner. Axial non-contrast 3 mm slices were carried out through the heart. The data set was analyzed on a dedicated work station and scored using the Agatson method.  FINDINGS: Non-cardiac: See separate report from [facility].  Ascending Aorta: Normal caliber.  Pericardium: Normal.  Coronary arteries: Normal origins.  IMPRESSION: Coronary calcium  score of 0.  Darryle Decent, MD   Electronically Signed By: Darryle Decent On: 08/18/2019 18:44  Narrative EXAM: OVER-READ INTERPRETATION  CT CHEST  The following report is an over-read performed by radiologist Dr. Rockey Kilts of [facility] on 08/18/2019. This over-read does not include interpretation of cardiac or coronary anatomy or pathology. The calcium  score interpretation by the cardiologist is attached.  COMPARISON:  06/23/2019 chest radiograph.  FINDINGS: Vascular: Aortic atherosclerosis.  Mediastinum/Nodes: No mediastinal or definite hilar adenopathy, given limitations of unenhanced CT.  Lungs/Pleura: No imaged pleural fluid.  Clear imaged lungs.  Upper Abdomen: Normal imaged portions of the liver, spleen.  Musculoskeletal: Bilateral breast implants. No acute osseous abnormality. Mild thoracic spondylosis.  IMPRESSION: 1.  No acute findings in the imaged extracardiac chest. 2.  Aortic and branch vessel atherosclerosis.  Electronically Signed: By: Rockey Kilts  M.D. On: 08/18/2019 16:22          Current Reported Medications:.    Current Meds  Medication Sig   aspirin-acetaminophen-caffeine (EXCEDRIN MIGRAINE) 250-250-65 MG tablet Take by mouth as needed for  headache.    atorvastatin  (LIPITOR) 10 MG tablet Take 0.5 tablets (5 mg total) by mouth daily.   cyanocobalamin  (VITAMIN B12) 1000 MCG/ML injection Inject 1,000 mcg into the muscle every 30 (thirty) days.   esomeprazole  (NEXIUM ) 20 MG capsule TAKE 1 CAPSULE (20 MG TOTAL) BY MOUTH 2 (TWO) TIMES DAILY BEFORE A MEAL. (Patient taking differently: Take 20 mg by mouth every morning.)   estradiol  (ESTRACE ) 0.5 MG tablet Take 0.5 mg by mouth daily.    famotidine  (PEPCID ) 20 MG tablet Take 1 tablet (20 mg total) by mouth at bedtime.   folic acid  (FOLVITE ) 1 MG tablet TAKE 1 TABLET BY MOUTH EVERY DAY   iron  polysaccharides (NIFEREX) 150 MG capsule TAKE 1 CAPSULE BY MOUTH DAILY WITH BREAKFAST.   Magnesium  250 MG CAPS Take 500 mg by mouth daily.   rizatriptan  (MAXALT -MLT) 10 MG disintegrating tablet Take 1 tablet (10 mg total) by mouth as needed for migraine. May repeat in 2 hours if needed   Ubrogepant  (UBRELVY ) 100 MG TABS Take 1 tablet (100 mg total) by mouth daily as needed. Take one tablet at onset of headache, may repeat 1 tablet in 2 hours, no more than 2 tablets in 24 hours   Vitamin D , Ergocalciferol , 2000 units CAPS Take 2,000 Units by mouth daily.   Current Facility-Administered Medications for the 11/04/23 encounter (Office Visit) with Lakenya Riendeau D, NP  Medication   cyanocobalamin  (VITAMIN B12) injection 1,000 mcg   Physical Exam:    VS:  BP 104/60   Pulse (!) 58   Ht 5' 5 (1.651 m)   Wt 143 lb 12.8 oz (65.2 kg)   SpO2 98%   BMI 23.93 kg/m    Wt Readings from Last 3 Encounters:  11/04/23 143 lb 12.8 oz (65.2 kg)  10/23/23 144 lb 6 oz (65.5 kg)  07/28/23 144 lb 11.2 oz (65.6 kg)    GEN: Well nourished, well developed in no acute distress NECK: No JVD; No carotid bruits CARDIAC: RRR, no murmurs, rubs, gallops RESPIRATORY:  Clear to auscultation without rales, wheezing or rhonchi  ABDOMEN: Soft, non-tender, non-distended EXTREMITIES:  No edema; No acute deformity   Asessement and  Plan:.    Precordial pain: Today patient reports that over the Christmas holidays she had a midsternal chest discomfort that was on and off for several days.  She did have 1 episode in which she had left arm pain.  One morning she woke up with a feeling of fullness in her chest, she does note that the chest discomfort was worse after eating.  She reports that her gastroenterologist has started her on nighttime famotidine  with some improvement.  She has not associated the chest pain with exertion.  Reviewed ED precautions. Check coronary CTA.  Her heart rate at rest today was 58 bpm.  Check BMET.   Hyperlipidemia: Last lipid profile on 04/20/2023 indicated total cholesterol 164, HDL 53, triglycerides 119 and LDL 90.  History of myalgias on Crestor 10 mg daily.  Previously started on Lipitor 5 mg daily with improvement in her LDL.  Will plan to recheck fasting lipid profile on follow-up pending results of coronary CTA.   Disposition: F/u with Josefa Syracuse, NP in six weeks.   Signed, Luddie Boghosian D Clea Dubach, NP

## 2023-11-04 ENCOUNTER — Encounter: Payer: Self-pay | Admitting: Cardiology

## 2023-11-04 ENCOUNTER — Ambulatory Visit: Payer: BC Managed Care – PPO | Attending: Cardiology | Admitting: Cardiology

## 2023-11-04 VITALS — BP 104/60 | HR 58 | Ht 65.0 in | Wt 143.8 lb

## 2023-11-04 DIAGNOSIS — E782 Mixed hyperlipidemia: Secondary | ICD-10-CM | POA: Diagnosis not present

## 2023-11-04 DIAGNOSIS — I700 Atherosclerosis of aorta: Secondary | ICD-10-CM | POA: Diagnosis not present

## 2023-11-04 DIAGNOSIS — R072 Precordial pain: Secondary | ICD-10-CM

## 2023-11-04 NOTE — Patient Instructions (Addendum)
 Medication Instructions:  No changes *If you need a refill on your cardiac medications before your next appointment, please call your pharmacy*  Lab Work: Today we will draw Bmet If you have labs (blood work) drawn today and your tests are completely normal, you will receive your results only by: MyChart Message (if you have MyChart) OR A paper copy in the mail If you have any lab test that is abnormal or we need to change your treatment, we will call you to review the results.  Testing/Procedures:   Your cardiac CT will be scheduled at one of the below locations:   [facility] [address] [place], [place] [address] [phone]  If scheduled at [place], please arrive at the [facility] and Children's Entrance (Entrance C2) of [facility] 30 minutes prior to test start time. You can use the FREE valet parking offered at entrance C (encouraged to control the heart rate for the test)  Proceed to the [facility] Radiology Department (first floor) to check-in and test prep.  All radiology patients and guests should use entrance C2 at [facility], accessed from [facility], even though the hospital's physical address listed is [address]   Please follow these instructions carefully (unless otherwise directed):  An IV will be required for this test and Nitroglycerin  will be given.   On the Night Before the Test: Be sure to Drink plenty of water. Do not consume any caffeinated/decaffeinated beverages or chocolate 12 hours prior to your test. Do not take any antihistamines 12 hours prior to your test.  On the Day of the Test: Drink plenty of water until 1 hour prior to the test. Do not eat any food 1 hour prior to test. You may take your regular medications prior to the test.  Take metoprolol  (Lopressor ) two hours prior to test. If you take Furosemide/Hydrochlorothiazide/Spironolactone/Chlorthalidone, please HOLD on  the morning of the test. Patients who wear a continuous glucose monitor MUST remove the device prior to scanning. FEMALES- please wear underwire-free bra if available, avoid dresses & tight clothing   After the Test: Drink plenty of water. After receiving IV contrast, you may experience a mild flushed feeling. This is normal. On occasion, you may experience a mild rash up to 24 hours after the test. This is not dangerous. If this occurs, you can take Benadryl 25 mg and increase your fluid intake. If you experience trouble breathing, this can be serious. If it is severe call 911 IMMEDIATELY. If it is mild, please call our office.  We will call to schedule your test 2-4 weeks out understanding that some insurance companies will need an authorization prior to the service being performed.   For more information and frequently asked questions, please visit our website : [email or web address]  For non-scheduling related questions, please contact the cardiac imaging nurse navigator should you have any questions/concerns: Cardiac Imaging Nurse Navigators Direct Office Dial: [phone]   For scheduling needs, including cancellations and rescheduling, please call Brittany, [phone].    Follow-Up: At [facility], you and your health needs are our priority.  As part of our continuing mission to provide you with exceptional heart care, we have created designated Provider Care Teams.  These Care Teams include your primary Cardiologist (physician) and Advanced Practice Providers (APPs -  Physician Assistants and Nurse Practitioners) who all work together to provide you with the care you need, when you need it.  We recommend signing up  for the patient portal called MyChart.  Sign up information is provided on this After Visit Summary.  MyChart is used to connect with patients for Virtual Visits (Telemedicine).  Patients are able to view lab/test results, encounter notes,  upcoming appointments, etc.  Non-urgent messages can be sent to your provider as well.   To learn more about what you can do with MyChart, go to [email or web address].    Your next appointment:   6 week(s)  Provider:   Katlyn West, NP

## 2023-11-05 LAB — BASIC METABOLIC PANEL
BUN/Creatinine Ratio: 16 (ref 12–28)
BUN: 10 mg/dL (ref 8–27)
CO2: 25 mmol/L (ref 20–29)
Calcium: 9.7 mg/dL (ref 8.7–10.3)
Chloride: 105 mmol/L (ref 96–106)
Creatinine, Ser: 0.64 mg/dL (ref 0.57–1.00)
Glucose: 92 mg/dL (ref 70–99)
Potassium: 4.9 mmol/L (ref 3.5–5.2)
Sodium: 143 mmol/L (ref 134–144)
eGFR: 100 mL/min/{1.73_m2} (ref >59)

## 2023-11-05 LAB — SPECIMEN STATUS REPORT

## 2023-11-10 ENCOUNTER — Ambulatory Visit: Payer: BC Managed Care – PPO | Admitting: Cardiology

## 2023-11-13 ENCOUNTER — Encounter (HOSPITAL_COMMUNITY): Payer: Self-pay

## 2023-11-17 ENCOUNTER — Ambulatory Visit (HOSPITAL_COMMUNITY)
Admission: RE | Admit: 2023-11-17 | Discharge: 2023-11-17 | Disposition: A | Discharge status: Home / Self Care | Payer: BC Managed Care – PPO | Source: Ambulatory Visit | Attending: Cardiology | Admitting: Cardiology

## 2023-11-17 ENCOUNTER — Ambulatory Visit: Payer: BC Managed Care – PPO

## 2023-11-17 ENCOUNTER — Other Ambulatory Visit (HOSPITAL_COMMUNITY): Payer: BC Managed Care – PPO

## 2023-11-17 DIAGNOSIS — E782 Mixed hyperlipidemia: Secondary | ICD-10-CM | POA: Insufficient documentation

## 2023-11-17 DIAGNOSIS — I700 Atherosclerosis of aorta: Secondary | ICD-10-CM | POA: Insufficient documentation

## 2023-11-17 DIAGNOSIS — R072 Precordial pain: Secondary | ICD-10-CM | POA: Diagnosis not present

## 2023-11-17 MED ORDER — NITROGLYCERIN 0.4 MG SL SUBL
SUBLINGUAL_TABLET | SUBLINGUAL | Status: AC
  Filled 2023-11-17: qty 2

## 2023-11-17 MED ORDER — DILTIAZEM HCL 25 MG/5ML IV SOLN: 10.0000 mg | INTRAVENOUS | Status: DC | PRN

## 2023-11-17 MED ORDER — METOPROLOL TARTRATE 5 MG/5ML IV SOLN: 10.0000 mg | Freq: Once | INTRAVENOUS | Status: DC | PRN

## 2023-11-17 MED ORDER — NITROGLYCERIN 0.4 MG SL SUBL
0.8000 mg | SUBLINGUAL_TABLET | Freq: Once | SUBLINGUAL | Status: AC
  Administered 2023-11-17: 0.8 mg via SUBLINGUAL

## 2023-11-17 MED ORDER — IOHEXOL 350 MG/ML SOLN
95.0000 mL | Freq: Once | INTRAVENOUS | Status: AC | PRN
  Administered 2023-11-17: 95 mL via INTRAVENOUS

## 2023-11-18 ENCOUNTER — Ambulatory Visit (INDEPENDENT_AMBULATORY_CARE_PROVIDER_SITE_OTHER): Payer: BC Managed Care – PPO

## 2023-11-18 DIAGNOSIS — E538 Deficiency of other specified B group vitamins: Secondary | ICD-10-CM | POA: Diagnosis not present

## 2023-11-18 NOTE — Progress Notes (Signed)
Pt here for monthly B12 injection per Dr Claiborne Billings  B12 given IM, and pt tolerated injection well.  Next B12 injection scheduled for 4 weeks.

## 2023-11-19 ENCOUNTER — Telehealth: Payer: Self-pay

## 2023-11-19 NOTE — Telephone Encounter (Signed)
-----   Message from Rip Harbour sent at 11/18/2023 10:31 AM EST ----- Please let Patricia Alvarez know that her coronary CTA showed a coronary calcium score of 0, there was no evidence of coronary artery disease.  Good result!  Follow-up as planned.

## 2023-11-19 NOTE — Telephone Encounter (Signed)
Called patient advised of below they verbalized understanding.

## 2023-12-16 ENCOUNTER — Ambulatory Visit: Payer: BC Managed Care – PPO | Admitting: Cardiology

## 2023-12-16 ENCOUNTER — Ambulatory Visit: Payer: BC Managed Care – PPO

## 2023-12-17 ENCOUNTER — Ambulatory Visit: Payer: BC Managed Care – PPO | Admitting: Cardiology

## 2023-12-17 ENCOUNTER — Ambulatory Visit: Payer: BC Managed Care – PPO | Admitting: Pediatrics

## 2023-12-22 NOTE — Progress Notes (Unsigned)
  Cardiology Office Note   Date:  12/23/2023  ID:  Patricia Alvarez, DOB Mar 07, 1961, MRN [id number] PCP:  Natalia Leatherwood, DO [facility] Cardiologist: Peter Swaziland, MD  Reason for visit: 6-week follow-up  History of Present Illness    Patricia Alvarez is a 63 y.o. female with a hx of hyperlipidemia, migraines, coronary calcium score of 0 in 2020.    She saw Ralph Dowdy NP in January 2025 for atypical midsternal chest pain over the Christmas holidays.  Coronary CTA was ordered -coronary calcium score 0, no evidence of CAD.  Today, she feels well.  She walks 2-1/2 miles daily without symptoms.  She states she feels like her chest discomfort was secondary to her hiatal hernia and reflux.  She took famotidine which relieves symptoms.  She is planning to follow-up with GI and is interested in hiatal hernia surgery.  She denies chest pain, shortness of breath, syncope, orthopnea, PND or significant pedal edema.  She asks if she needs to continue Lipitor.  She states when she for started about a year ago, she felt flu symptoms for a week but that resolved and she has no persistent side effects.  She has a family history of heart disease and stroke.   Objective / Physical Exam   Vital signs:  BP 102/70 (BP Location: Left Arm, Patient Position: Sitting, Cuff Size: Normal)   Pulse 77   Ht 5\' 5"  (1.651 m)   Wt 144 lb 12.8 oz (65.7 kg)   SpO2 98%   BMI 24.10 kg/m     GEN: No acute distress NECK: No carotid bruits CARDIAC: RRR, no murmurs RESPIRATORY:  Clear to auscultation without rales, wheezing or rhonchi  EXTREMITIES: No edema  Assessment and Plan   Precordial pain, resolved -Likely 2/2 GERD, improved with famotidine -Coronary CTA with calcium score 0, no evidence of CAD.  Hyperlipidemia with goal LDL less than 100 -Lipids June 2024 with LDL 90, HDL 53 and triglycerides 118. -LDL improved from 153 to 90 with statin medication.  Continue Lipitor 10 mg half tablet  daily. -Recommend cholesterol lowering diets - Mediterranean diet, DASH diet, vegetarian diet, low-carbohydrate diet and avoidance of trans fats.  Nuts, high-fiber foods, and fiber supplements may also improve lipids.    Disposition - Follow-up in 1 year.  Check lipids prior to next appointment.   Signed, Cannon Kettle, PA-C  12/23/2023 [facility]

## 2023-12-23 ENCOUNTER — Ambulatory Visit: Payer: BC Managed Care – PPO | Attending: Cardiovascular Disease | Admitting: Physician Assistant

## 2023-12-23 ENCOUNTER — Encounter: Payer: Self-pay | Admitting: Physician Assistant

## 2023-12-23 VITALS — BP 102/70 | HR 77 | Ht 65.0 in | Wt 144.8 lb

## 2023-12-23 DIAGNOSIS — E782 Mixed hyperlipidemia: Secondary | ICD-10-CM | POA: Diagnosis not present

## 2023-12-23 DIAGNOSIS — R072 Precordial pain: Secondary | ICD-10-CM

## 2023-12-23 NOTE — Patient Instructions (Signed)
 Medication Instructions:  No changes *If you need a refill on your cardiac medications before your next appointment, please call your pharmacy*   Lab Work: Lipid Panel : June 2025 If you have labs (blood work) drawn today and your tests are completely normal, you will receive your results only by: MyChart Message (if you have MyChart) OR A paper copy in the mail If you have any lab test that is abnormal or we need to change your treatment, we will call you to review the results.   Testing/Procedures: No Testing   Follow-Up: At [facility], you and your health needs are our priority.  As part of our continuing mission to provide you with exceptional heart care, we have created designated Provider Care Teams.  These Care Teams include your primary Cardiologist (physician) and Advanced Practice Providers (APPs -  Physician Assistants and Nurse Practitioners) who all work together to provide you with the care you need, when you need it.  We recommend signing up for the patient portal called "MyChart".  Sign up information is provided on this After Visit Summary.  MyChart is used to connect with patients for Virtual Visits (Telemedicine).  Patients are able to view lab/test results, encounter notes, upcoming appointments, etc.  Non-urgent messages can be sent to your provider as well.   To learn more about what you can do with MyChart, go to [email or web address].    Your next appointment:   1 year(s)  Provider:   Peter Swaziland, MD

## 2023-12-31 NOTE — Progress Notes (Signed)
 [facility] Return Visit   Referring Provider Natalia Leatherwood, DO [address] [place],  [place] [address]  Primary Care Provider Natalia Leatherwood, DO  Patient Profile: Patricia Alvarez is a 63 y.o. female with a past medical history noteworthy for osteopenia, degenerative disc disease, vitamin D deficiency, HLD, vitamin B12 deficiency, IDA who returns to the [facility] for follow-up of the problem(s) noted below.  Problem List: GERD LA grade A esophagitis Hiatal hernia Non-celiac gluten sensitivity History of adenomatous colon polyps   History of Present Illness   Ms. Knittel was last seen in the GI office 10/23/2023 by Willette Cluster, PA   Current GI Meds  Esomperazole 20 mg po daily  Famotidine 20 mg p.o. nightly - not taking  Interval History   GERD, LA grade A esophagitis, hiatal hernia -- Reports longstanding history of GERD times many years -- When she experienced exacerbation of symptoms in past had a cardiac CT that was negative -- Previously had symptoms of hiccuping and GERD at night which has not occurred for the last 6 months  -- Currently on esomeprazole 20 mg p.o. daily -not taking famotidine at this time -- Symptoms currently well-controlled on PPI but states that if she misses a dose of medication has recurrent symptoms -- No current heartburn, regurgitation, pyrosis, dysphagia or odynophagia -- Has some intermittent symptoms of hiccuping and eructation -- Worries about being on long-term PPI -reviewed risks versus benefits including impact on bone mineral density -- She inquires about forms of surgical management and TIF due to her desire to be off of medications  -- Maintains a healthy lifestyle and weight -- Careful about avoiding trigger foods -- Walks 2 miles twice a day and good weather   Non-celiac gluten sensitivity -- Reports a family history of celiac disease and her granddaughter -- She placed herself on a  gluten-free diet many years ago and noted feeling better from a GI perspective -- Reports she discussed gluten challenge with Dr. Russella Dar but deferred on this because she did not want to resume eating gluten -- Duodenal biopsies during EGD 08/2022 were normal -presume she was avoiding gluten at that time -- Normal bowel habits and no bloating   History of adenomatous colon polyp --Had 1 nonadvanced tubular adenoma on colonoscopy in 2015; no polyps on colonoscopy in 2020 -- No family history of colon polyps or colon cancer -- Discussed that based upon current guidelines she could proceed with a 10-year interval between colonoscopies -next due 2030  Last colonoscopy: 10/2018 - normal, IH Last endoscopy: 08/2022 - LA Grade A esophagitis, HH  Last Abd CT/CTE/MRE: CTAP 12/2020 -no acute findings  GI Review of Symptoms Significant for None. Otherwise negative.  General Review of Systems  Review of systems is significant for the pertinent positives and negatives as listed per the HPI.  Full ROS is otherwise negative.  Past Medical History   Past Medical History:  Diagnosis Date   Allergic rhinitis    Allergy    B12 deficiency    Back pain    Chicken pox    Colon polyp 2015   TUBULAR ADENOMA (X 1).   DDD (degenerative disc disease), cervical    Endometriosis of uterus    GERD (gastroesophageal reflux disease)    Hyperlipidemia    IBS (irritable bowel syndrome)    Internal hemorrhoid    Migraines    Rectal bleeding    Vitamin D deficiency      Past  Surgical History   Past Surgical History:  Procedure Laterality Date   ABDOMINAL HYSTERECTOMY  1994   APPENDECTOMY  02/2000   and ovary   AUGMENTATION MAMMAPLASTY Bilateral 1998   Saline,retropectoral    COLONOSCOPY W/ BIOPSIES  2015   multiple   COSMETIC SURGERY  Tummy tuck   ESOPHAGOGASTRODUODENOSCOPY  2011   multiple   JOINT REPLACEMENT  2019   LAPAROSCOPY     OOPHORECTOMY Left 02/2000   w/appendectomy   PARTIAL  HYSTERECTOMY  1994   right ovary and uterus   TOTAL HIP ARTHROPLASTY Right 08/2018   August 31, 2018   TUBAL LIGATION  1990     Allergies and Medications   Allergies  Allergen Reactions   Codeine Itching and Nausea Only    REACTION: nausea   Crestor [Rosuvastatin] Other (See Comments)    Myalgias    Gluten Meal Other (See Comments)    Stomach swells    Morphine Nausea Only    REACTION: nausea   Oxycodone-Acetaminophen Nausea And Vomiting   Prednisone     REACTION: Hyperactive   Sulfa Antibiotics Nausea Only   Current Meds  Medication Sig   aspirin-acetaminophen-caffeine (EXCEDRIN MIGRAINE) 250-250-65 MG tablet Take by mouth as needed for headache.    atorvastatin (LIPITOR) 10 MG tablet TAKE 1/2 TABLET BY MOUTH DAILY   cyanocobalamin (VITAMIN B12) 1000 MCG/ML injection Inject 1,000 mcg into the muscle every 30 (thirty) days.   esomeprazole (NEXIUM) 20 MG capsule TAKE 1 CAPSULE (20 MG TOTAL) BY MOUTH 2 (TWO) TIMES DAILY BEFORE A MEAL. (Patient taking differently: Take 20 mg by mouth every morning.)   estradiol (ESTRACE) 0.5 MG tablet Take 0.5 mg by mouth daily.    folic acid (FOLVITE) 1 MG tablet TAKE 1 TABLET BY MOUTH EVERY DAY   iron polysaccharides (NIFEREX) 150 MG capsule TAKE 1 CAPSULE BY MOUTH DAILY WITH BREAKFAST.   Magnesium 250 MG CAPS Take 500 mg by mouth daily.   rizatriptan (MAXALT-MLT) 10 MG disintegrating tablet Take 1 tablet (10 mg total) by mouth as needed for migraine. May repeat in 2 hours if needed   Ubrogepant (UBRELVY) 100 MG TABS Take 1 tablet (100 mg total) by mouth daily as needed. Take one tablet at onset of headache, may repeat 1 tablet in 2 hours, no more than 2 tablets in 24 hours   Vitamin D, Ergocalciferol, 2000 units CAPS Take 2,000 Units by mouth daily.   Current Facility-Administered Medications for the 01/01/24 encounter (Office Visit) with Ottie Glazier, MD  Medication   cyanocobalamin (VITAMIN B12) injection 1,000 mcg     Family History    Family History  Problem Relation Age of Onset   Hyperlipidemia Mother 36   Heart failure Mother 65   Arthritis Mother    Asthma Mother    Hearing loss Mother    Heart disease Mother    Hypertension Mother    Miscarriages / [place] Mother    Colon polyps Father    Stroke Father    Hearing loss Father    Dementia Father    Parkinson's disease Brother    Breast cancer Maternal Grandmother 60   Colon cancer Neg Hx    Esophageal cancer Neg Hx    Rectal cancer Neg Hx    Stomach cancer Neg Hx    Granddaughter with celiac disease  Social History   Social History   Tobacco Use   Smoking status: Never   Smokeless tobacco: Never  Vaping Use   Vaping  status: Never Used  Substance Use Topics   Alcohol use: Never   Drug use: Never   Dwayna reports that she has never smoked. She has never used smokeless tobacco. She reports that she does not drink alcohol and does not use drugs.  Vital Signs and Physical Examination   Vitals:   01/01/24 1541  BP: 102/70  Pulse: 71   Body mass index is 24.13 kg/m. Weight: 145 lb (65.8 kg)  General: Well developed, well nourished, no acute distress Head: Normocephalic and atraumatic Eyes: Sclerae anicteric, EOMI Lungs: Clear throughout to auscultation Heart: Regular rate and rhythm; No murmurs, rubs or bruits Abdomen: Soft, non tender and non distended. No masses, hepatosplenomegaly or hernias noted. Normal Bowel sounds Rectal: Deferred Musculoskeletal: Symmetrical with no gross deformities    Review of Data  The following data was reviewed at the time of this encounter:  Laboratory Studies      Latest Ref Rng & Units 07/28/2023    9:31 AM 04/27/2023    2:55 PM 01/27/2023    1:03 PM  CBC  WBC 4.0 - 10.5 K/uL 4.6  5.6  6.9   Hemoglobin 12.0 - 15.0 g/dL 57.8  46.9  62.9   Hematocrit 36.0 - 46.0 % 40.9  39.0  38.3   Platelets 150 - 400 K/uL 268  275  299     Lab Results  Component Value Date   LIPASE 29.0 12/24/2020       Latest Ref Rng & Units 11/04/2023   12:00 AM 04/20/2023   12:54 PM 01/27/2023    1:03 PM  CMP  Glucose 70 - 99 mg/dL 92   99   BUN 8 - 27 mg/dL 10   12   Creatinine 5.28 - 1.00 mg/dL 4.13   2.44   Sodium 010 - 144 mmol/L 143   138   Potassium 3.5 - 5.2 mmol/L 4.9   3.7   Chloride 96 - 106 mmol/L 105   102   CO2 20 - 29 mmol/L 25   28   Calcium 8.7 - 10.3 mg/dL 9.7   9.5   Total Protein 6.0 - 8.5 g/dL  6.2  6.9   Total Bilirubin 0.0 - 1.2 mg/dL  0.2  0.4   Alkaline Phos 44 - 121 IU/L  90  61   AST 0 - 40 IU/L  23  18   ALT 0 - 32 IU/L  12  11     Imaging Studies  CTAP 12/2020 No acute findings  CTAP 09/2015 No acute findings, post appendectomy and hysterectomy  GI Procedures and Studies   Upper endoscopies EGD 08/2022 LA Grade A esophagitis, HH Path: Normal duodenal biopsies  EGD 10/2009 Normal  EGD 12/2000 Evidence of mild reflux  Colonoscopies  Colonoscopy 10/2018 Normal, IH  Colonoscopy 10/2013 3mm cecal polyp ( TA ), IH  Flexible sigmoidoscopy 04/2005 Normal, IH  Colonoscopy 08/2002 Normal   Clinical Impression  It is my clinical impression that Ms. Platas is a 63 y.o. female with;  GERD LA grade A esophagitis Hiatal hernia Non-celiac gluten sensitivity History of adenomatous colon polyp  Aireanna returns to the office today for follow-up of foregut issues of GERD, LA grade a esophagitis and hiatal hernia as well as known celiac gluten sensitivity and history of adenomatous colon polyp.  In terms of her GERD and esophagitis issues, these are currently well-controlled on esomeprazole 20 mg orally daily.  In the past she has used PPI in conjunction with H2  blocker therapy, however, her symptoms have been well-controlled recently on PPI monotherapy at a low dose.  At today's visit she expresses concern about being on long-term PPI therapy citing association of the medication with dementia.  We reviewed the subsequent studies that refuted an association between PPI  and dementia.  I advised her that there can be issues of osteopenia and osteoporosis related to long-term PPI use and I would recommend ongoing DEXA scan monitoring and ensuring she is consuming adequate amounts of calcium and vitamin D.  She actively participates in weightbearing exercises walking 2 to 4 miles a day.  She also inquires about norms of surgical management such as fundoplication and TIF.  I reviewed that we typically reserve these therapies for patients with medically refractory disease and would discuss her case with my colleague who performs TIF.  She also reports a history of nonceliac gluten sensitivity.  After her granddaughter was diagnosed with celiac disease she embarked on a gluten-free diet and noted resolution of gastrointestinal symptoms.  She deferred a gluten challenge to determine whether or not she had true celiac disease.  Duodenal biopsies in 2023 were negative for celiac disease although she was presumably on a gluten-free diet at that time.  At this juncture we discussed continuing to categorize her condition as nonceliac gluten sensitivity.  We spent a length of time reviewing her prior colonoscopy reports.  She has only had 1 lifetime nonadvanced tubular adenoma in 2015.  Her other colonoscopies were devoid of polyps including her most recent 1 in 2020.  Based upon these findings and current GI society guidelines, I advised that she can undergo her next colonoscopy in 2030.  Plan  Continue as omeprazole 20 mg orally daily Continue lifestyle modification for management of GERD Recommend discussing with her PCP DEXA scan monitoring of bone mineral density given long-term PPI use and ensuring adequate calcium and vitamin D intake Advised that I would discuss her case with my colleague who performs TIF regarding candidacy Continue gluten-free diet Next surveillance colonoscopy in 2030  Planned Follow Up  1 year  The patient or caregiver verbalized understanding of  the material covered, with no barriers to understanding. All questions were answered. Patient or caregiver is agreeable with the plan outlined above.    It was a pleasure to see Padme.  If you have any questions or concerns regarding this evaluation, do not hesitate to contact me.  Maren Beach, MD [facility]   I spent total of 30 minutes in both face-to-face and non-face-to-face activities, excluding procedures performed, for the visit on the date of this encounter.

## 2024-01-01 ENCOUNTER — Encounter: Payer: Self-pay | Admitting: Pediatrics

## 2024-01-01 ENCOUNTER — Ambulatory Visit: Payer: BC Managed Care – PPO | Admitting: Pediatrics

## 2024-01-01 ENCOUNTER — Ambulatory Visit (INDEPENDENT_AMBULATORY_CARE_PROVIDER_SITE_OTHER)

## 2024-01-01 ENCOUNTER — Other Ambulatory Visit: Payer: Self-pay | Admitting: Cardiology

## 2024-01-01 VITALS — BP 102/70 | HR 71 | Ht 65.0 in | Wt 145.0 lb

## 2024-01-01 DIAGNOSIS — K9041 Non-celiac gluten sensitivity: Secondary | ICD-10-CM | POA: Diagnosis not present

## 2024-01-01 DIAGNOSIS — Z860101 Personal history of adenomatous and serrated colon polyps: Secondary | ICD-10-CM

## 2024-01-01 DIAGNOSIS — K208 Other esophagitis without bleeding: Secondary | ICD-10-CM

## 2024-01-01 DIAGNOSIS — K449 Diaphragmatic hernia without obstruction or gangrene: Secondary | ICD-10-CM

## 2024-01-01 DIAGNOSIS — E538 Deficiency of other specified B group vitamins: Secondary | ICD-10-CM | POA: Diagnosis not present

## 2024-01-01 DIAGNOSIS — K2100 Gastro-esophageal reflux disease with esophagitis, without bleeding: Secondary | ICD-10-CM | POA: Diagnosis not present

## 2024-01-01 DIAGNOSIS — Z860100 Personal history of colon polyps, unspecified: Secondary | ICD-10-CM

## 2024-01-01 NOTE — Progress Notes (Signed)
 Pt here for monthly B12 injection per Dr.Kuneff  B12 given IM right deltoid, and pt tolerated injection well.  Next B12 injection should be scheduled for 1 month

## 2024-01-01 NOTE — Patient Instructions (Signed)
 Follow up in 1 year  _______________________________________________________  If your blood pressure at your visit was 140/90 or greater, please contact your primary care physician to follow up on this.  _______________________________________________________  If you are age 62 or older, your body mass index should be between 23-30. Your Body mass index is 24.13 kg/m. If this is out of the aforementioned range listed, please consider follow up with your Primary Care Provider.  If you are age 63 or younger, your body mass index should be between 19-25. Your Body mass index is 24.13 kg/m. If this is out of the aformentioned range listed, please consider follow up with your Primary Care Provider.   ________________________________________________________  The [facility] providers would like to encourage you to use [facility] to communicate with providers for non-urgent requests or questions.  Due to long hold times on the telephone, sending your provider a message by [facility] may be a faster and more efficient way to get a response.  Please allow 48 business hours for a response.  Please remember that this is for non-urgent requests.  _______________________________________________________   Thank you for entrusting me with your care and choosing [facility].  Dr Doy Hutching

## 2024-01-05 DIAGNOSIS — Z0000 Encounter for general adult medical examination without abnormal findings: Secondary | ICD-10-CM | POA: Diagnosis not present

## 2024-01-08 ENCOUNTER — Ambulatory Visit: Payer: BC Managed Care – PPO | Admitting: Cardiology

## 2024-01-26 ENCOUNTER — Inpatient Hospital Stay: Payer: BC Managed Care – PPO | Admitting: Internal Medicine

## 2024-01-26 ENCOUNTER — Inpatient Hospital Stay: Payer: BC Managed Care – PPO | Attending: Internal Medicine

## 2024-01-26 VITALS — BP 92/68 | HR 68 | Temp 97.6°F | Resp 16 | Ht 65.0 in | Wt 146.7 lb

## 2024-01-26 DIAGNOSIS — D500 Iron deficiency anemia secondary to blood loss (chronic): Secondary | ICD-10-CM | POA: Diagnosis not present

## 2024-01-26 DIAGNOSIS — D508 Other iron deficiency anemias: Secondary | ICD-10-CM

## 2024-01-26 DIAGNOSIS — K648 Other hemorrhoids: Secondary | ICD-10-CM | POA: Insufficient documentation

## 2024-01-26 DIAGNOSIS — Z860100 Personal history of colon polyps, unspecified: Secondary | ICD-10-CM | POA: Insufficient documentation

## 2024-01-26 LAB — CBC WITH DIFFERENTIAL (CANCER CENTER ONLY)
Abs Immature Granulocytes: 0.01 10*3/uL (ref 0.00–0.07)
Basophils Absolute: 0.1 10*3/uL (ref 0.0–0.1)
Basophils Relative: 1 %
Eosinophils Absolute: 0.1 10*3/uL (ref 0.0–0.5)
Eosinophils Relative: 3 %
HCT: 39.7 % (ref 36.0–46.0)
Hemoglobin: 13.4 g/dL (ref 12.0–15.0)
Immature Granulocytes: 0 %
Lymphocytes Relative: 32 %
Lymphs Abs: 1.8 10*3/uL (ref 0.7–4.0)
MCH: 31.2 pg (ref 26.0–34.0)
MCHC: 33.8 g/dL (ref 30.0–36.0)
MCV: 92.3 fL (ref 80.0–100.0)
Monocytes Absolute: 0.3 10*3/uL (ref 0.1–1.0)
Monocytes Relative: 6 %
Neutro Abs: 3.3 10*3/uL (ref 1.7–7.7)
Neutrophils Relative %: 58 %
Platelet Count: 321 10*3/uL (ref 150–400)
RBC: 4.3 MIL/uL (ref 3.87–5.11)
RDW: 12.9 % (ref 11.5–15.5)
WBC Count: 5.7 10*3/uL (ref 4.0–10.5)
nRBC: 0 % (ref 0.0–0.2)

## 2024-01-26 LAB — IRON AND IRON BINDING CAPACITY (CC-WL,HP ONLY)
Iron: 83 ug/dL (ref 28–170)
Saturation Ratios: 21 % (ref 10.4–31.8)
TIBC: 392 ug/dL (ref 250–450)
UIBC: 309 ug/dL (ref 148–442)

## 2024-01-26 NOTE — Progress Notes (Signed)
 [facility] [phone]   [phone]  OFFICE PROGRESS NOTE  Patricia Leatherwood, DO [address] [place] [address]  DIAGNOSIS: Iron Deficiency anemia    PRIOR THERAPY: None    CURRENT THERAPY: Oral iron supplements with every other day  p.o. daily.   INTERVAL HISTORY: Patricia Alvarez 63 y.o. female returns to the clinic today for follow-up visit.Discussed the use of AI scribe software for clinical note transcription with the patient, who gave verbal consent to proceed.  History of Present Illness   Patricia Alvarez is a 63 year old female with iron deficiency anemia secondary to gastrointestinal blood loss from internal hemorrhoids who presents for evaluation and repeat blood work.  She has a history of iron deficiency anemia due to gastrointestinal blood loss from internal hemorrhoids. She is currently taking over-the-counter ferrous sulfate every other day along with vitamin C or orange juice to aid absorption. She feels generally good with no new health complaints since her last visit six months ago, except for experiencing some dizziness over the past six to eight weeks. No fatigue or other concerning symptoms.  She underwent a colonoscopy in 2020, which showed no polyps, following a previous finding of a precancerous polyp in 2015. Based on these results, she has been advised to wait until 2030 for her next colonoscopy unless new symptoms arise. She continues to have internal hemorrhoids.       MEDICAL HISTORY: Past Medical History:  Diagnosis Date   Allergic rhinitis    Allergy    B12 deficiency    Back pain    Chicken pox    Colon polyp 2015   TUBULAR ADENOMA (X 1).   DDD (degenerative disc disease), cervical    Endometriosis of uterus    GERD (gastroesophageal reflux disease)    Hyperlipidemia    IBS (irritable bowel syndrome)    Internal hemorrhoid    Migraines    Rectal bleeding    Vitamin D deficiency     ALLERGIES:  is  allergic to codeine, crestor [rosuvastatin], gluten meal, morphine, oxycodone-acetaminophen, prednisone, and sulfa antibiotics.  MEDICATIONS:  Current Outpatient Medications  Medication Sig Dispense Refill   aspirin-acetaminophen-caffeine (EXCEDRIN MIGRAINE) 250-250-65 MG tablet Take by mouth as needed for headache.      atorvastatin (LIPITOR) 10 MG tablet TAKE 1/2 TABLET BY MOUTH DAILY 45 tablet 3   cyanocobalamin (VITAMIN B12) 1000 MCG/ML injection Inject 1,000 mcg into the muscle every 30 (thirty) days.     esomeprazole (NEXIUM) 20 MG capsule TAKE 1 CAPSULE (20 MG TOTAL) BY MOUTH 2 (TWO) TIMES DAILY BEFORE A MEAL. (Patient taking differently: Take 20 mg by mouth every morning.) 180 capsule 0   estradiol (ESTRACE) 0.5 MG tablet Take 0.5 mg by mouth daily.   3   famotidine (PEPCID) 20 MG tablet Take 1 tablet (20 mg total) by mouth at bedtime. (Patient not taking: Reported on 01/01/2024) 30 tablet 5   folic acid (FOLVITE) 1 MG tablet TAKE 1 TABLET BY MOUTH EVERY DAY 90 tablet 1   iron polysaccharides (NIFEREX) 150 MG capsule TAKE 1 CAPSULE BY MOUTH DAILY WITH BREAKFAST. 90 capsule 0   Magnesium 250 MG CAPS Take 500 mg by mouth daily.     rizatriptan (MAXALT-MLT) 10 MG disintegrating tablet Take 1 tablet (10 mg total) by mouth as needed for migraine. May repeat in 2 hours if needed 9 tablet 12   Ubrogepant (UBRELVY) 100 MG TABS Take 1  tablet (100 mg total) by mouth daily as needed. Take one tablet at onset of headache, may repeat 1 tablet in 2 hours, no more than 2 tablets in 24 hours 10 tablet 11   Vitamin D, Ergocalciferol, 2000 units CAPS Take 2,000 Units by mouth daily.     Current Facility-Administered Medications  Medication Dose Route Frequency Provider Last Rate Last Admin   cyanocobalamin (VITAMIN B12) injection 1,000 mcg  1,000 mcg Intramuscular Q30 days Kuneff, Renee A, DO   1,000 mcg at 01/01/24 1610    SURGICAL HISTORY:  Past Surgical History:  Procedure Laterality Date    ABDOMINAL HYSTERECTOMY  1994   APPENDECTOMY  02/2000   and ovary   AUGMENTATION MAMMAPLASTY Bilateral 1998   Saline,retropectoral    COLONOSCOPY W/ BIOPSIES  2015   multiple   COSMETIC SURGERY  Tummy tuck   ESOPHAGOGASTRODUODENOSCOPY  2011   multiple   JOINT REPLACEMENT  2019   LAPAROSCOPY     OOPHORECTOMY Left 02/2000   w/appendectomy   PARTIAL HYSTERECTOMY  1994   right ovary and uterus   TOTAL HIP ARTHROPLASTY Right 08/2018   August 31, 2018   TUBAL LIGATION  1990    REVIEW OF SYSTEMS:  A comprehensive review of systems was negative.   PHYSICAL EXAMINATION: General appearance: alert, cooperative, and no distress Head: Normocephalic, without obvious abnormality, atraumatic Neck: no adenopathy, no JVD, supple, symmetrical, trachea midline, and thyroid not enlarged, symmetric, no tenderness/mass/nodules Lymph nodes: Cervical, supraclavicular, and axillary nodes normal. Resp: clear to auscultation bilaterally Back: symmetric, no curvature. ROM normal. No CVA tenderness. Cardio: regular rate and rhythm, S1, S2 normal, no murmur, click, rub or gallop GI: soft, non-tender; bowel sounds normal; no masses,  no organomegaly Extremities: extremities normal, atraumatic, no cyanosis or edema  ECOG PERFORMANCE STATUS: 1 - Symptomatic but completely ambulatory  Blood pressure 92/68, pulse 68, temperature 97.6 F (36.4 C), temperature source Temporal, resp. rate 16, height 5\' 5"  (1.651 m), weight 146 lb 11.2 oz (66.5 kg), SpO2 100%.  LABORATORY DATA: Lab Results  Component Value Date   WBC 5.7 01/26/2024   HGB 13.4 01/26/2024   HCT 39.7 01/26/2024   MCV 92.3 01/26/2024   PLT 321 01/26/2024      Chemistry      Component Value Date/Time   NA 143 11/04/2023 0000   K 4.9 11/04/2023 0000   CL 105 11/04/2023 0000   CO2 25 11/04/2023 0000   BUN 10 11/04/2023 0000   CREATININE 0.64 11/04/2023 0000   CREATININE 0.70 01/27/2023 1303      Component Value Date/Time   CALCIUM 9.7  11/04/2023 0000   ALKPHOS 90 04/20/2023 1254   AST 23 04/20/2023 1254   AST 18 01/27/2023 1303   ALT 12 04/20/2023 1254   ALT 11 01/27/2023 1303   BILITOT 0.2 04/20/2023 1254   BILITOT 0.4 01/27/2023 1303       RADIOGRAPHIC STUDIES: No results found.  ASSESSMENT AND PLAN: This is a very pleasant 63 years old white female with history of iron deficiency anemia secondary to gastrointestinal blood loss likely from internal hemorrhoids.  She is currently on over-the-counter ferrous sulfate with orange juice every other day and tolerating it fairly well. The patient is feeling fine today with no concerning complaints.    Iron deficiency anemia secondary to gastrointestinal blood loss from internal hemorrhoids Cassie Poland has iron deficiency anemia due to gastrointestinal blood loss from internal hemorrhoids. She is on a regimen of ferrous sulfate every other  day with vitamin C/orange juice. Her CBC is normal with a hemoglobin level of 13.4, indicating well-controlled anemia. She reports occasional dizziness over the past 6-8 weeks but no other significant symptoms. The current regimen is effective, and there is no need for frequent follow-ups unless her condition changes. If her iron levels are significantly low, she will be contacted for further evaluation. - Continue ferrous sulfate every other day with vitamin C/orange juice. - Monitor for symptoms of anemia such as fatigue or significant dizziness. - Contact if hemoglobin levels drop or if symptoms worsen. - Follow up with family doctor as needed.  Colonic polyps Cassie had colonic polyps, with a hyperplastic polyp found in 2015 and no polyps found in 2020. Her new doctor advised that she does not need another colonoscopy for ten years unless symptoms arise, as the polyps were not adenomatous and were considered precancerous but not of immediate concern. - No immediate need for colonoscopy unless symptoms develop. - Follow up with  gastroenterologist as needed.   She was advised to call if she has any concerning symptoms  The patient voices understanding of current disease status and treatment options and is in agreement with the current care plan.  All questions were answered. The patient knows to call the clinic with any problems, questions or concerns. We can certainly see the patient much sooner if necessary.  The total time spent in the appointment was 20 minutes.  Disclaimer: This note was dictated with voice recognition software. Similar sounding words can inadvertently be transcribed and may not be corrected upon review.

## 2024-01-27 LAB — FERRITIN: Ferritin: 24 ng/mL (ref 11–307)

## 2024-01-28 ENCOUNTER — Encounter: Payer: Self-pay | Admitting: Pediatrics

## 2024-01-29 DIAGNOSIS — E538 Deficiency of other specified B group vitamins: Secondary | ICD-10-CM | POA: Diagnosis not present

## 2024-02-09 DIAGNOSIS — M25562 Pain in left knee: Secondary | ICD-10-CM | POA: Insufficient documentation

## 2024-02-10 DIAGNOSIS — M25562 Pain in left knee: Secondary | ICD-10-CM | POA: Diagnosis not present

## 2024-02-26 DIAGNOSIS — E538 Deficiency of other specified B group vitamins: Secondary | ICD-10-CM | POA: Diagnosis not present

## 2024-03-14 ENCOUNTER — Telehealth: Payer: Self-pay | Admitting: Family Medicine

## 2024-03-14 DIAGNOSIS — G43109 Migraine with aura, not intractable, without status migrainosus: Secondary | ICD-10-CM

## 2024-03-14 NOTE — Telephone Encounter (Signed)
 Pt last seen 07/20/23 by AL,NP. Next appt scheduled for 07/21/24. Dx: Migraines. Primary MD: Dr. Salli Crawley.   Plan per last note: "Patricia Alvarez is doing well. We will continue Ubrelvy  100mg  as needed for abortive therapy. She may use rizatriptan  in combination with Ubrelvy  for intractable headaches but advised against regular use of any abortive agent. Healthy lifestyle habits advised. Annual follow-up advised, sooner if needed. She verbalizes understanding and agreement this plan. "   I called pt and offered sooner appt for 03/30/24 at 8am with AL,NP. She accepted. I scheduled and cx 07/21/24 appt since we got her in sooner. Placed on wait list.  Has had migraine off and on since this past Thursday. Ubrelvy /rizatriptan  helps some. Feels she is having Ice pick headache. Sharp pain lasted for seconds and resolved. Occurred 3-4 x this past Thursday. Pain on back of head. This is different than her normal migraines. I placed on hold and spoke w/ Amy. Ok to offer sumatriptan to take place of rizatriptan  and order MRI Brain w wo. Also ask if pt had change in HRT.    I spoke w/ pt who states she did decrease estradiol  2 wk ago to 1/2 tab po every day and going to go back to 1 tablet daily starting tomorrow to see if this helps first. Declined sumatriptan at this time. Agreeable to MRI. MRI ordered placed to be done at [facility] mobile. Confirmed no allergy/contraindication to contrast. Aware once approved via insurance, will be called to schedule. Confirmed she has [employer].   She will proceed to ER if sx severe or develops any new/concerning sx for immediate evaluation.

## 2024-03-14 NOTE — Telephone Encounter (Signed)
 Pt has asked for an earlier appointment due to her headaches worsening.  Phone rep placed pt on wait list and informed her she would message RN to see what they could suggest while waiting to be seen

## 2024-03-16 ENCOUNTER — Other Ambulatory Visit: Payer: Self-pay | Admitting: Physician Assistant

## 2024-03-22 ENCOUNTER — Telehealth: Payer: Self-pay

## 2024-03-22 MED ORDER — POLYSACCHARIDE IRON COMPLEX 150 MG PO CAPS: 150.0000 mg | ORAL_CAPSULE | Freq: Every day | ORAL | 3 refills | Status: AC

## 2024-03-22 NOTE — Telephone Encounter (Signed)
 Fax received from [facility].  Refill provided

## 2024-03-24 DIAGNOSIS — M25562 Pain in left knee: Secondary | ICD-10-CM | POA: Diagnosis not present

## 2024-03-25 DIAGNOSIS — Z1329 Encounter for screening for other suspected endocrine disorder: Secondary | ICD-10-CM | POA: Diagnosis not present

## 2024-03-25 DIAGNOSIS — E538 Deficiency of other specified B group vitamins: Secondary | ICD-10-CM | POA: Diagnosis not present

## 2024-03-25 DIAGNOSIS — R5383 Other fatigue: Secondary | ICD-10-CM | POA: Diagnosis not present

## 2024-03-29 ENCOUNTER — Ambulatory Visit

## 2024-03-29 DIAGNOSIS — G43109 Migraine with aura, not intractable, without status migrainosus: Secondary | ICD-10-CM

## 2024-03-29 MED ORDER — GADOBENATE DIMEGLUMINE 529 MG/ML IV SOLN
13.0000 mL | Freq: Once | INTRAVENOUS | Status: AC | PRN
  Administered 2024-03-29: 13 mL via INTRAVENOUS

## 2024-03-29 NOTE — Patient Instructions (Signed)
 Below is our plan:  We will continue Ubrelvy  and rizatriptan  as needed. Let me know if the ice pick headaches or the occipital tenderness worsens. We can consider adding gabapentin or Emgality.   Please discuss headaches with your GYN provider to make sure it is safe to continue Estrogen. You reported an aura in 2020 of an unusual smell prior to headaches but have not had that happen recently.   I recommend against use of estrogen based products with your history of migraine with aura as this can increase your risk of stroke. Please discuss with your provider.   Please make sure you are staying well hydrated. I recommend 50-60 ounces daily. Well balanced diet and regular exercise encouraged. Consistent sleep schedule with 6-8 hours recommended.   Please continue follow up with care team as directed.   Follow up with me in 1 year   You may receive a survey regarding today's visit. I encourage you to leave honest feed back as I do use this information to improve patient care. Thank you for seeing me today!   GENERAL HEADACHE INFORMATION:   Natural supplements: Magnesium  Oxide or Magnesium  Glycinate 500 mg at bed (up to 800 mg daily) Coenzyme Q10 300 mg in AM Vitamin B2- 200 mg twice a day   Add 1 supplement at a time since even natural supplements can have undesirable side effects. You can sometimes buy supplements cheaper (especially Coenzyme Q10) at www.WebmailGuide.co.za or at [facility].  Migraine with aura: There is increased risk for stroke in women with migraine with aura and a contraindication for the combined contraceptive pill for use by women who have migraine with aura. The risk for women with migraine without aura is lower. However other risk factors like smoking are far more likely to increase stroke risk than migraine. There is a recommendation for no smoking and for the use of OCPs without estrogen such as progestogen only pills particularly for women with migraine with aura.Aaron Aas People who  have migraine headaches with auras may be 3 times more likely to have a stroke caused by a blood clot, compared to migraine patients who don't see auras. Women who take hormone-replacement therapy may be 30 percent more likely to suffer a clot-based stroke than women not taking medication containing estrogen. Other risk factors like smoking and high blood pressure may be  much more important.    Vitamins and herbs that show potential:   Magnesium : Magnesium  (250 mg twice a day or 500 mg at bed) has a relaxant effect on smooth muscles such as blood vessels. Individuals suffering from frequent or daily headache usually have low magnesium  levels which can be increase with daily supplementation of 400-750 mg. Three trials found 40-90% average headache reduction  when used as a preventative. Magnesium  may help with headaches are aura, the best evidence for magnesium  is for migraine with aura is its thought to stop the cortical spreading depression we believe is the pathophysiology of migraine aura.Magnesium  also demonstrated the benefit in menstrually related migraine.  Magnesium  is part of the messenger system in the serotonin cascade and it is a good muscle relaxant.  It is also useful for constipation which can be a side effect of other medications used to treat migraine. Good sources include nuts, whole grains, and tomatoes. Side Effects: loose stool/diarrhea  Riboflavin (vitamin B 2) 200 mg twice a day. This vitamin assists nerve cells in the production of ATP a principal energy storing molecule.  It is necessary for many chemical  reactions in the body.  There have been at least 3 clinical trials of riboflavin using 400 mg per day all of which suggested that migraine frequency can be decreased.  All 3 trials showed significant improvement in over half of migraine sufferers.  The supplement is found in bread, cereal, milk, meat, and poultry.  Most Americans get more riboflavin than the recommended daily  allowance, however riboflavin deficiency is not necessary for the supplements to help prevent headache. Side effects: energizing, green urine   Coenzyme Q10: This is present in almost all cells in the body and is critical component for the conversion of energy.  Recent studies have shown that a nutritional supplement of CoQ10 can reduce the frequency of migraine attacks by improving the energy production of cells as with riboflavin.  Doses of 150 mg twice a day have been shown to be effective.   Melatonin: Increasing evidence shows correlation between melatonin secretion and headache conditions.  Melatonin supplementation has decreased headache intensity and duration.  It is widely used as a sleep aid.  Sleep is natures way of dealing with migraine.  A dose of 3 mg is recommended to start for headaches including cluster headache. Higher doses up to 15 mg has been reviewed for use in Cluster headache and have been used. The rationale behind using melatonin for cluster is that many theories regarding the cause of Cluster headache center around the disruption of the normal circadian rhythm in the brain.  This helps restore the normal circadian rhythm.   HEADACHE DIET: Foods and beverages which may trigger migraine Note that only 20% of headache patients are food sensitive. You will know if you are food sensitive if you get a headache consistently 20 minutes to 2 hours after eating a certain food. Only cut out a food if it causes headaches, otherwise you might remove foods you enjoy! What matters most for diet is to eat a well balanced healthy diet full of vegetables and low fat protein, and to not miss meals.   Chocolate, other sweets ALL cheeses except cottage and cream cheese Dairy products, yogurt, sour cream, ice cream Liver Meat extracts (Bovril, Marmite, meat tenderizers) Meats or fish which have undergone aging, fermenting, pickling or smoking. These include:  Hotdogs,salami,Lox,sausage, mortadellas,smoked salmon, pepperoni, Pickled herring Pods of broad bean (English beans, Chinese pea pods, [place] (fava) beans, lima and navy beans Ripe avocado, ripe banana Yeast extracts or active yeast preparations such as Brewer's or Fleishman's (commercial bakes goods are permitted) Tomato based foods, pizza (lasagna, etc.)   MSG (monosodium glutamate) is disguised as many things; look for these common aliases: Monopotassium glutamate Autolysed yeast Hydrolysed protein Sodium caseinate "flavorings" "all natural preservatives" Nutrasweet   Avoid all other foods that convincingly provoke headaches.   Resources: The Dizzy Althia Jetty Your Headache Diet, migrainestrong.com  https://zamora-andrews.com/   Caffeine and Migraine For patients that have migraine, caffeine intake more than 3 days per week can lead to dependency and increased migraine frequency. I would recommend cutting back on your caffeine intake as best you can. The recommended amount of caffeine is 200-300 mg daily, although migraine patients may experience dependency at even lower doses. While you may notice an increase in headache temporarily, cutting back will be helpful for headaches in the long run. For more information on caffeine and migraine, visit: https://americanmigrainefoundation.org/resource-library/caffeine-and-migraine/   Headache Prevention Strategies:   1. Maintain a headache diary; learn to identify and avoid triggers.  - This can be a simple note where you  log when you had a headache, associated symptoms, and medications used - There are several smartphone apps developed to help track migraines: Migraine Buddy, Migraine Monitor, Curelator N1-Headache App   Common triggers include: Emotional triggers: Emotional/Upset family or friends Emotional/Upset occupation Business reversal/success Anticipation  anxiety Crisis-serious Post-crisis periodNew job/position   Physical triggers: Vacation Day Weekend Strenuous Exercise High Altitude Location New Move Menstrual Day Physical Illness Oversleep/Not enough sleep Weather changes Light: Photophobia or light sesnitivity treatment involves a balance between desensitization and reduction in overly strong input. Use dark polarized glasses outside, but not inside. Avoid bright or fluorescent light, but do not dim environment to the point that going into a normally lit room hurts. Consider FL-41 tint lenses, which reduce the most irritating wavelengths without blocking too much light.  These can be obtained at axonoptics.com or theraspecs.com Foods: see list above.   2. Limit use of acute treatments (over-the-counter medications, triptans, etc.) to no more than 2 days per week or 10 days per month to prevent medication overuse headache (rebound headache).     3. Follow a regular schedule (including weekends and holidays): Don't skip meals. Eat a balanced diet. 8 hours of sleep nightly. Minimize stress. Exercise 30 minutes per day. Being overweight is associated with a 5 times increased risk of chronic migraine. Keep well hydrated and drink 6-8 glasses of water per day.   4. Initiate non-pharmacologic measures at the earliest onset of your headache. Rest and quiet environment. Relax and reduce stress. Breathe2Relax is a free app that can instruct you on    some simple relaxtion and breathing techniques. Http://Dawnbuse.com is a    free website that provides teaching videos on relaxation.  Also, there are  many apps that   can be downloaded for "mindful" relaxation.  An app called YOGA NIDRA will help walk you through mindfulness. Another app called Calm can be downloaded to give you a structured mindfulness guide with daily reminders and skill development. Headspace for guided meditation Mindfulness Based Stress Reduction Online Course:  www.palousemindfulness.com Cold compresses.   5. Don't wait!! Take the maximum allowable dosage of prescribed medication at the first sign of migraine.   6. Compliance:  Take prescribed medication regularly as directed and at the first sign of a migraine.   7. Communicate:  Call your physician when problems arise, especially if your headaches change, increase in frequency/severity, or become associated with neurological symptoms (weakness, numbness, slurred speech, etc.). Proceed to emergency room if you experience new or worsening symptoms or symptoms do not resolve, if you have new neurologic symptoms or if headache is severe, or for any concerning symptom.   8. Headache/pain management therapies: Consider various complementary methods, including medication, behavioral therapy, psychological counselling, biofeedback, massage therapy, acupuncture, dry needling, and other modalities.  Such measures may reduce the need for medications. Counseling for pain management, where patients learn to function and ignore/minimize their pain, seems to work very well.   9. Recommend changing family's attention and focus away from patient's headaches. Instead, emphasize daily activities. If first question of day is 'How are your headaches/Do you have a headache today?', then patient will constantly think about headaches, thus making them worse. Goal is to re-direct attention away from headaches, toward daily activities and other distractions.   10. Helpful Websites: www.AmericanHeadacheSociety.org PatentHood.ch www.headaches.org TightMarket.nl www.achenet.org

## 2024-03-29 NOTE — Progress Notes (Unsigned)
 PATIENT: Patricia Alvarez DOB: Oct 25, 1961  REASON FOR VISIT: follow up HISTORY FROM: patient  No chief complaint on file.   HISTORY OF PRESENT ILLNESS:  03/29/24 ALL: Patricia Alvarez returns for follow up for migraines. She was last seen 06/2023 and doing well on Ubrelvy  and rizatriptan  as needed. She called 02/2024 requesting sooner appt for ice pick headaches not responsive to rizatriptan  or ubrelvy . She had decreased estrogen dose and felt it may be contributing. Sumatriptan offered but declined. We ordered MRI and performed 03/29/2024. Results showed nonspecific T2/flair white matter hyperintensities but no enhancement.   Since,   Aura..unusual smell prior to headache.  Estrogen   07/22/2023 ALL:  Patricia Alvarez returns for follow up for migraines. She continues Ubrelvy  PRN. She felt that migraines were very well managed while on HRT. When stopping about 3-4 months ago, migraines worsened. She resumed HRT 05/2023 and reports improvement in hot flashes and migraines. She rarely uses rizatriptan  or Excedrin.   06/04/2022 ALL: Patricia Alvarez returns for follow up for migraines. We switched abortie therapy from rizatriptan  to Nurtec at last visit, however, it was not effective and switched to Ubrelvy  in 06/2021. Since, she reports doing well. Migraines are stable. She may have 5-6 per month. She feels Ubrelvy  100mg  works best. She occasionally needs to take rizatriptan  if Ubrelvy  doesn't abort migraine. She averages less than 10 total doses of abortive meds each month.   06/04/2021 ALL: She returns for follow up for migraines. She continues rizatriptan  for abortive therapy. She feels that she is doing fairly well. She reports having about 8 headache days a month, half are migrainous. She feels rizatriptan  continues to be helpful for abortive therapy but she usually has to repeat dose. She has more sleepiness with second dose and sometimes has a rebound headache the next day. She denies any other syncopal/unresponsive spells.  She feels this was related to stress with previous employment.   05/05/2019 ALL: Patricia Alvarez is a 63 y.o. female here today for follow up for follow-up of migraines.  She continues to have approximately 1-2 migraines per month.  She reports that rizatriptan  works if she catches her headaches soon enough.  She has been attempting treatment with Tylenol prior to this prescription.  She reports that sometimes she has to take 2 doses of rizatriptan  to abort migraine.  Otherwise she is doing well.  No other concerns today.  HISTORY: (copied from Dr Elon Hakim note on 11/08/2018)  UPDATE (11/08/18, VRP): Since last visit, doing well. She reports that she has had a migraine about once or twice a month since being seen in July, 2019. She reports that the migraine is usually located behind her ear (usually right sided but can occur on the left). Pain is described as a pulsating, pressure sensation that radiates up to the top of her head. She will have light sensitivity and occasionally nausea with migraine. She can lie in a dark room with light pressure to her head and headaches improve. She has noted an unusual smell prior to migraine. She is taking Maxalt  with onset of migraine and this usually helps. She feels that she usually has another migraine the following day that is relieved with Maxalt  as well. She rarely takes an Excedrin for non migrainous headaches. She denies stroke like symptoms with migraine. She is contributing the improvement to treatment of vitamin B12 and vitamin D  deficiency. She has been treated with B12 injections as well as oral supplements. She is also taking vitamin D  and  magnesium  supplements OTC. She reports that her numbness and tingling in her right hand and arm is significantly improved.    PRIOR HPI (05/03/2018, VRP): 63 year old female here for evaluation of headaches.   Patient had onset of headaches sometime in her late 57s.  She describes pounding, unilateral, mainly left-sided,  throbbing severe headaches associated with nausea, blurred vision, sensitivity to sound, seeing dots and spots.  Headaches were intermittent for many years.  Patient treated these with over-the-counter medications.   The last couple of years patient has had slight change in her headaches with sharp shooting pain in her left ear, left eye, facial numbness, lip numbness, drooling sensation, arm numbness.  These also were associated with headaches.  Patient feeling headaches and pain behind her ears and behind her head.  Headaches increased in April 2019.  Now patient having headaches 3-4 times per week.  Patient had a CT scan and MRI of the brain, as well as emergency room evaluation, and was diagnosed with probable complicated migraine.  She was prescribed amitriptyline  but has not tried it yet.   Patient has family history of migraine in her mother.  No specific triggering factors currently for her headaches.   Patient has a long history of somewhat interrupted sleep and mild insomnia.  She is able to fall asleep without difficulty but then tends to wake up in the middle the night and has difficulty falling back asleep.    REVIEW OF SYSTEMS: Out of a complete 14 system review of symptoms, the patient complains only of the following symptoms, headaches and all other reviewed systems are negative.  ALLERGIES: Allergies  Allergen Reactions   Codeine Itching and Nausea Only    REACTION: nausea   Crestor [Rosuvastatin] Other (See Comments)    Myalgias    Gluten Meal Other (See Comments)    Stomach swells    Morphine Nausea Only    REACTION: nausea   Oxycodone-Acetaminophen Nausea And Vomiting   Prednisone     REACTION: Hyperactive   Sulfa Antibiotics Nausea Only    HOME MEDICATIONS: Outpatient Medications Prior to Visit  Medication Sig Dispense Refill   aspirin-acetaminophen-caffeine (EXCEDRIN MIGRAINE) 250-250-65 MG tablet Take by mouth as needed for headache.      atorvastatin  (LIPITOR)  10 MG tablet TAKE 1/2 TABLET BY MOUTH DAILY 45 tablet 3   cyanocobalamin  (VITAMIN B12) 1000 MCG/ML injection Inject 1,000 mcg into the muscle every 30 (thirty) days.     esomeprazole  (NEXIUM ) 20 MG capsule TAKE 1 CAPSULE (20 MG TOTAL) BY MOUTH 2 (TWO) TIMES DAILY BEFORE A MEAL. (Patient taking differently: Take 20 mg by mouth every morning.) 180 capsule 0   estradiol  (ESTRACE ) 0.5 MG tablet Take 0.5 mg by mouth daily.   3   famotidine  (PEPCID ) 20 MG tablet Take 1 tablet (20 mg total) by mouth at bedtime. (Patient not taking: Reported on 01/01/2024) 30 tablet 5   folic acid  (FOLVITE ) 1 MG tablet TAKE 1 TABLET BY MOUTH EVERY DAY 90 tablet 1   iron  polysaccharides (NIFEREX) 150 MG capsule Take 1 capsule (150 mg total) by mouth daily with breakfast. 90 capsule 3   Magnesium  250 MG CAPS Take 500 mg by mouth daily.     rizatriptan  (MAXALT -MLT) 10 MG disintegrating tablet Take 1 tablet (10 mg total) by mouth as needed for migraine. May repeat in 2 hours if needed 9 tablet 12   Ubrogepant  (UBRELVY ) 100 MG TABS Take 1 tablet (100 mg total) by mouth daily as needed. Take  one tablet at onset of headache, may repeat 1 tablet in 2 hours, no more than 2 tablets in 24 hours 10 tablet 11   Vitamin D , Ergocalciferol , 2000 units CAPS Take 2,000 Units by mouth daily.     Facility-Administered Medications Prior to Visit  Medication Dose Route Frequency Provider Last Rate Last Admin   cyanocobalamin  (VITAMIN B12) injection 1,000 mcg  1,000 mcg Intramuscular Q30 days Kuneff, Renee A, DO   1,000 mcg at 01/01/24 1610    PAST MEDICAL HISTORY: Past Medical History:  Diagnosis Date   Allergic rhinitis    Allergy    B12 deficiency    Back pain    Chicken pox    Colon polyp 2015   TUBULAR ADENOMA (X 1).   DDD (degenerative disc disease), cervical    Endometriosis of uterus    GERD (gastroesophageal reflux disease)    Hyperlipidemia    IBS (irritable bowel syndrome)    Internal hemorrhoid    Migraines    Rectal  bleeding    Vitamin D  deficiency     PAST SURGICAL HISTORY: Past Surgical History:  Procedure Laterality Date   ABDOMINAL HYSTERECTOMY  1994   APPENDECTOMY  02/2000   and ovary   AUGMENTATION MAMMAPLASTY Bilateral 1998   Saline,retropectoral    COLONOSCOPY W/ BIOPSIES  2015   multiple   COSMETIC SURGERY  Tummy tuck   ESOPHAGOGASTRODUODENOSCOPY  2011   multiple   JOINT REPLACEMENT  2019   LAPAROSCOPY     OOPHORECTOMY Left 02/2000   w/appendectomy   PARTIAL HYSTERECTOMY  1994   right ovary and uterus   TOTAL HIP ARTHROPLASTY Right 08/2018   August 31, 2018   TUBAL LIGATION  1990    FAMILY HISTORY: Family History  Problem Relation Age of Onset   Hyperlipidemia Mother 5   Heart failure Mother 47   Arthritis Mother    Asthma Mother    Hearing loss Mother    Heart disease Mother    Hypertension Mother    Miscarriages / [place] Mother    Colon polyps Father    Stroke Father    Hearing loss Father    Dementia Father    Parkinson's disease Brother    Breast cancer Maternal Grandmother 5   Colon cancer Neg Hx    Esophageal cancer Neg Hx    Rectal cancer Neg Hx    Stomach cancer Neg Hx     SOCIAL HISTORY: Social History   Socioeconomic History   Marital status: Married    Spouse name: Not on file   Number of children: 2   Years of education: 12   Highest education level: Not on file  Occupational History   Occupation: Cytogeneticist: [employer]  Tobacco Use   Smoking status: Never   Smokeless tobacco: Never  Vaping Use   Vaping status: Never Used  Substance and Sexual Activity   Alcohol use: Never   Drug use: Never   Sexual activity: Yes    Partners: Male    Birth control/protection: Surgical  Other Topics Concern   Not on file  Social History Narrative   Married. 2 children.    HS graduate. Employed as a Librarian, academic.    Uses bicycle helmet, smoke alarm in the home.   Feels safe in her relationships.   Caffeine- unsweet  1 quart most days.    Social Drivers of Corporate investment banker Strain: Not on file  Food Insecurity: Not  on file  Transportation Needs: Not on file  Physical Activity: Not on file  Stress: Not on file  Social Connections: Unknown (03/07/2022)   Received from [facility]   Social Network    Social Network: Not on file  Intimate Partner Violence: Unknown (01/27/2022)   Received from [facility], [facility]   HITS    Physically Hurt: Not on file    Insult or Talk Down To: Not on file    Threaten Physical Harm: Not on file    Scream or Curse: Not on file      PHYSICAL EXAM  There were no vitals filed for this visit.     There is no height or weight on file to calculate BMI.  Generalized: Well developed, in no acute distress  Cardiology: normal rate and rhythm, no murmur noted Neurological examination  Mentation: Alert oriented to time, place, history taking. Follows all commands speech and language fluent Cranial nerve II-XII: Pupils were equal round reactive to light. Extraocular movements were full, visual field were full on confrontational test. Facial sensation and strength were normal. Head turning and shoulder shrug  were normal and symmetric. Motor: The motor testing reveals 5 over 5 strength of all 4 extremities. Good symmetric motor tone is noted throughout.  Gait and station: Gait is normal.    DIAGNOSTIC DATA (LABS, IMAGING, TESTING) - I reviewed patient records, labs, notes, testing and imaging myself where available.      No data to display           Lab Results  Component Value Date   WBC 5.7 01/26/2024   HGB 13.4 01/26/2024   HCT 39.7 01/26/2024   MCV 92.3 01/26/2024   PLT 321 01/26/2024      Component Value Date/Time   NA 143 11/04/2023 0000   K 4.9 11/04/2023 0000   CL 105 11/04/2023 0000   CO2 25 11/04/2023 0000   GLUCOSE 92 11/04/2023 0000   GLUCOSE 99 01/27/2023 1303   BUN 10 11/04/2023 0000   CREATININE 0.64  11/04/2023 0000   CREATININE 0.70 01/27/2023 1303   CALCIUM  9.7 11/04/2023 0000   PROT 6.2 04/20/2023 1254   ALBUMIN 4.2 04/20/2023 1254   AST 23 04/20/2023 1254   AST 18 01/27/2023 1303   ALT 12 04/20/2023 1254   ALT 11 01/27/2023 1303   ALKPHOS 90 04/20/2023 1254   BILITOT 0.2 04/20/2023 1254   BILITOT 0.4 01/27/2023 1303   GFRNONAA >60 01/27/2023 1303   GFRAA >60 04/15/2018 1137   Lab Results  Component Value Date   CHOL 164 04/20/2023   HDL 53 04/20/2023   LDLCALC 90 04/20/2023   LDLDIRECT 168.7 01/06/2008   TRIG 118 04/20/2023   CHOLHDL 3.1 04/20/2023   Lab Results  Component Value Date   HGBA1C 5.6 11/27/2022   Lab Results  Component Value Date   VITAMINB12 711 01/27/2023   Lab Results  Component Value Date   TSH 3.87 11/27/2022     ASSESSMENT AND PLAN 63 y.o. year old female  has a past medical history of Allergic rhinitis, Allergy, B12 deficiency, Back pain, Chicken pox, Colon polyp (2015), DDD (degenerative disc disease), cervical, Endometriosis of uterus, GERD (gastroesophageal reflux disease), Hyperlipidemia, IBS (irritable bowel syndrome), Internal hemorrhoid, Migraines, Rectal bleeding, and Vitamin D  deficiency. here with   No diagnosis found.    Bani is doing well. We will continue Ubrelvy  100mg  as needed for abortive therapy. She may use rizatriptan  in combination with Ubrelvy   for intractable headaches but advised against regular use of any abortive agent. Healthy lifestyle habits advised. Annual follow-up advised, sooner if needed.  She verbalizes understanding and agreement this plan.   No orders of the defined types were placed in this encounter.     No orders of the defined types were placed in this encounter.     Terrilyn Fick, FNP-C 03/29/2024, 2:30 PM [facility] [address], [address] [place], [place] [address] [phone]

## 2024-03-30 ENCOUNTER — Encounter: Payer: Self-pay | Admitting: Family Medicine

## 2024-03-30 ENCOUNTER — Ambulatory Visit: Admitting: Family Medicine

## 2024-03-30 VITALS — BP 104/71 | HR 65 | Ht 65.0 in | Wt 148.0 lb

## 2024-03-30 DIAGNOSIS — G43109 Migraine with aura, not intractable, without status migrainosus: Secondary | ICD-10-CM | POA: Diagnosis not present

## 2024-03-30 DIAGNOSIS — R519 Headache, unspecified: Secondary | ICD-10-CM

## 2024-03-30 MED ORDER — UBRELVY 100 MG PO TABS: 100.0000 mg | ORAL_TABLET | Freq: Every day | ORAL | 11 refills | Status: AC | PRN

## 2024-03-30 MED ORDER — RIZATRIPTAN BENZOATE 10 MG PO TBDP: 10.0000 mg | ORAL_TABLET | ORAL | 12 refills | Status: AC | PRN

## 2024-04-01 DIAGNOSIS — G43009 Migraine without aura, not intractable, without status migrainosus: Secondary | ICD-10-CM | POA: Diagnosis not present

## 2024-04-03 ENCOUNTER — Encounter: Payer: Self-pay | Admitting: Family Medicine

## 2024-04-28 DIAGNOSIS — E538 Deficiency of other specified B group vitamins: Secondary | ICD-10-CM | POA: Diagnosis not present

## 2024-05-03 ENCOUNTER — Telehealth: Payer: Self-pay | Admitting: Pediatrics

## 2024-05-03 DIAGNOSIS — K2100 Gastro-esophageal reflux disease with esophagitis, without bleeding: Secondary | ICD-10-CM

## 2024-05-03 MED ORDER — ESOMEPRAZOLE MAGNESIUM 20 MG PO CPDR: 20.0000 mg | DELAYED_RELEASE_CAPSULE | Freq: Every day | ORAL | 3 refills | Status: AC

## 2024-05-03 NOTE — Telephone Encounter (Signed)
 I spoke to [facility] and advised her that her refill has been sent to her pharmacy.

## 2024-05-03 NOTE — Telephone Encounter (Signed)
 Nexium refilled

## 2024-05-03 NOTE — Telephone Encounter (Signed)
 Requesting medication refill for Nexium  sent into [facility] in [place]. Please advise.   Thank you

## 2024-05-11 DIAGNOSIS — M542 Cervicalgia: Secondary | ICD-10-CM | POA: Diagnosis not present

## 2024-05-11 DIAGNOSIS — R221 Localized swelling, mass and lump, neck: Secondary | ICD-10-CM | POA: Diagnosis not present

## 2024-05-16 DIAGNOSIS — M542 Cervicalgia: Secondary | ICD-10-CM | POA: Diagnosis not present

## 2024-05-27 DIAGNOSIS — Z03818 Encounter for observation for suspected exposure to other biological agents ruled out: Secondary | ICD-10-CM | POA: Diagnosis not present

## 2024-05-27 DIAGNOSIS — R5383 Other fatigue: Secondary | ICD-10-CM | POA: Diagnosis not present

## 2024-05-27 DIAGNOSIS — J029 Acute pharyngitis, unspecified: Secondary | ICD-10-CM | POA: Diagnosis not present

## 2024-05-27 DIAGNOSIS — E538 Deficiency of other specified B group vitamins: Secondary | ICD-10-CM | POA: Diagnosis not present

## 2024-06-09 DIAGNOSIS — Z6824 Body mass index (BMI) 24.0-24.9, adult: Secondary | ICD-10-CM | POA: Diagnosis not present

## 2024-06-09 DIAGNOSIS — Z01419 Encounter for gynecological examination (general) (routine) without abnormal findings: Secondary | ICD-10-CM | POA: Diagnosis not present

## 2024-06-14 DIAGNOSIS — M7989 Other specified soft tissue disorders: Secondary | ICD-10-CM | POA: Diagnosis not present

## 2024-06-14 DIAGNOSIS — M542 Cervicalgia: Secondary | ICD-10-CM | POA: Diagnosis not present

## 2024-06-17 ENCOUNTER — Other Ambulatory Visit: Payer: Self-pay | Admitting: Obstetrics and Gynecology

## 2024-06-17 DIAGNOSIS — Z1231 Encounter for screening mammogram for malignant neoplasm of breast: Secondary | ICD-10-CM

## 2024-06-22 DIAGNOSIS — E538 Deficiency of other specified B group vitamins: Secondary | ICD-10-CM | POA: Diagnosis not present

## 2024-07-18 ENCOUNTER — Ambulatory Visit
Admission: RE | Admit: 2024-07-18 | Discharge: 2024-07-18 | Disposition: A | Discharge status: Home / Self Care | Source: Ambulatory Visit | Attending: Obstetrics and Gynecology | Admitting: Obstetrics and Gynecology

## 2024-07-18 DIAGNOSIS — Z1231 Encounter for screening mammogram for malignant neoplasm of breast: Secondary | ICD-10-CM

## 2024-07-21 ENCOUNTER — Ambulatory Visit: Payer: BC Managed Care – PPO | Admitting: Family Medicine

## 2024-07-29 DIAGNOSIS — E538 Deficiency of other specified B group vitamins: Secondary | ICD-10-CM | POA: Diagnosis not present

## 2024-08-16 DIAGNOSIS — L814 Other melanin hyperpigmentation: Secondary | ICD-10-CM | POA: Diagnosis not present

## 2024-08-16 DIAGNOSIS — L570 Actinic keratosis: Secondary | ICD-10-CM | POA: Diagnosis not present

## 2024-08-16 DIAGNOSIS — Z85828 Personal history of other malignant neoplasm of skin: Secondary | ICD-10-CM | POA: Diagnosis not present

## 2024-08-16 DIAGNOSIS — D2272 Melanocytic nevi of left lower limb, including hip: Secondary | ICD-10-CM | POA: Diagnosis not present

## 2024-08-16 DIAGNOSIS — D2271 Melanocytic nevi of right lower limb, including hip: Secondary | ICD-10-CM | POA: Diagnosis not present

## 2024-08-16 DIAGNOSIS — L821 Other seborrheic keratosis: Secondary | ICD-10-CM | POA: Diagnosis not present

## 2024-08-16 DIAGNOSIS — D485 Neoplasm of uncertain behavior of skin: Secondary | ICD-10-CM | POA: Diagnosis not present

## 2024-08-26 DIAGNOSIS — E538 Deficiency of other specified B group vitamins: Secondary | ICD-10-CM | POA: Diagnosis not present

## 2024-08-30 DIAGNOSIS — M1812 Unilateral primary osteoarthritis of first carpometacarpal joint, left hand: Secondary | ICD-10-CM | POA: Diagnosis not present

## 2024-09-14 ENCOUNTER — Other Ambulatory Visit: Payer: Self-pay | Admitting: Physician Assistant

## 2024-09-21 DIAGNOSIS — D518 Other vitamin B12 deficiency anemias: Secondary | ICD-10-CM | POA: Diagnosis not present

## 2024-09-29 DIAGNOSIS — M7062 Trochanteric bursitis, left hip: Secondary | ICD-10-CM | POA: Diagnosis not present

## 2024-09-29 DIAGNOSIS — M25562 Pain in left knee: Secondary | ICD-10-CM | POA: Diagnosis not present

## 2024-10-08 DIAGNOSIS — M25562 Pain in left knee: Secondary | ICD-10-CM | POA: Diagnosis not present

## 2024-10-14 DIAGNOSIS — E538 Deficiency of other specified B group vitamins: Secondary | ICD-10-CM | POA: Diagnosis not present

## 2024-10-17 DIAGNOSIS — M222X2 Patellofemoral disorders, left knee: Secondary | ICD-10-CM | POA: Diagnosis not present

## 2024-12-13 ENCOUNTER — Ambulatory Visit: Admitting: Pediatrics

## 2025-04-03 ENCOUNTER — Ambulatory Visit: Admitting: Family Medicine

## 2025-04-06 ENCOUNTER — Ambulatory Visit: Admitting: Family Medicine
